# Patient Record
Sex: Female | Born: 1990 | Race: Black or African American | Hispanic: No | Marital: Married | Smoking: Never smoker
Health system: Southern US, Community
[De-identification: ages and names within clinical notes are randomized; demographics above are authoritative.]

## PROBLEM LIST (undated history)

## (undated) DIAGNOSIS — T7840XA Allergy, unspecified, initial encounter: Secondary | ICD-10-CM

## (undated) DIAGNOSIS — D649 Anemia, unspecified: Secondary | ICD-10-CM

## (undated) DIAGNOSIS — O24419 Gestational diabetes mellitus in pregnancy, unspecified control: Secondary | ICD-10-CM

## (undated) DIAGNOSIS — L309 Dermatitis, unspecified: Secondary | ICD-10-CM

## (undated) HISTORY — DX: Allergy, unspecified, initial encounter: T78.40XA

## (undated) HISTORY — PX: WISDOM TOOTH EXTRACTION: SHX21

## (undated) HISTORY — DX: Dermatitis, unspecified: L30.9

## (undated) HISTORY — DX: Anemia, unspecified: D64.9

---
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
## 2013-01-02 HISTORY — PX: TONSILLECTOMY AND ADENOIDECTOMY: SHX28

## 2013-01-17 ENCOUNTER — Encounter: Payer: Self-pay | Admitting: Family Medicine

## 2013-01-17 ENCOUNTER — Ambulatory Visit (INDEPENDENT_AMBULATORY_CARE_PROVIDER_SITE_OTHER): Payer: 59 | Admitting: Family Medicine

## 2013-01-17 VITALS — BP 122/80 | HR 101 | Temp 98.8°F | Ht 60.0 in | Wt 185.0 lb

## 2013-01-17 DIAGNOSIS — L819 Disorder of pigmentation, unspecified: Secondary | ICD-10-CM

## 2013-01-17 DIAGNOSIS — R319 Hematuria, unspecified: Secondary | ICD-10-CM

## 2013-01-17 LAB — CBC WITH DIFFERENTIAL/PLATELET
Eosinophils Absolute: 0 10*3/uL (ref 0.0–0.7)
Eosinophils Relative: 0.8 % (ref 0.0–5.0)
MCHC: 34 g/dL (ref 30.0–36.0)
MCV: 88.3 fl (ref 78.0–100.0)
Monocytes Absolute: 0.2 10*3/uL (ref 0.1–1.0)
Neutro Abs: 2.4 10*3/uL (ref 1.4–7.7)
Neutrophils Relative %: 61.5 % (ref 43.0–77.0)
Platelets: 260 10*3/uL (ref 150.0–400.0)

## 2013-01-17 LAB — BASIC METABOLIC PANEL
BUN: 5 mg/dL — ABNORMAL LOW (ref 6–23)
CO2: 24 mEq/L (ref 19–32)
Chloride: 109 mEq/L (ref 96–112)
Creatinine, Ser: 0.9 mg/dL (ref 0.4–1.2)

## 2013-01-17 LAB — HEPATIC FUNCTION PANEL
AST: 20 U/L (ref 0–37)
Albumin: 3.8 g/dL (ref 3.5–5.2)
Bilirubin, Direct: 0 mg/dL (ref 0.0–0.3)
Total Bilirubin: 0.4 mg/dL (ref 0.3–1.2)
Total Protein: 8.2 g/dL (ref 6.0–8.3)

## 2013-01-17 LAB — POCT URINALYSIS DIPSTICK
Glucose, UA: NEGATIVE
Ketones, UA: NEGATIVE
Leukocytes, UA: NEGATIVE
Nitrite, UA: NEGATIVE
Spec Grav, UA: 1.01
Urobilinogen, UA: 0.2
pH, UA: 6

## 2013-01-17 NOTE — Progress Notes (Signed)
  Subjective:    Patient ID: Cheryl Mullen, female    DOB: 07-Nov-1990, 22 y.o.   MRN: [id number]  HPI Pt is here with mom to establish.  She c/o palms turning orange after tonsilectomy but it is better today.     Review of Systems As above  Past Medical History  Diagnosis Date  . Anemia   . Allergy   . Eczema    No current outpatient prescriptions on file prior to visit.   No current facility-administered medications on file prior to visit.   Family History  Problem Relation Age of Onset  . Hypertension Mother   . Hyperlipidemia Mother   . Hypertension Father   . Hyperlipidemia Father   . Hypertension Maternal Grandmother   . Hypertension Maternal Grandfather   . Diabetes Maternal Grandfather   . Hypertension Paternal Grandmother   . Hypertension Paternal Grandfather    History   Social History  . Marital Status: Single    Spouse Name: N/A    Number of Children: N/A  . Years of Education: N/A   Occupational History  . [employer] group leader    Social History Main Topics  . Smoking status: Never Smoker   . Smokeless tobacco: Not on file  . Alcohol Use: Yes  . Drug Use: No  . Sexual Activity: Not Currently    Partners: Female    Birth Control/ Protection: Injection   Other Topics Concern  . Not on file   Social History Narrative   Exercise--- NO       Objective:   Physical Exam BP 122/80  Pulse 101  Temp(Src) 98.8 F (37.1 C) (Oral)  Ht 5' (1.524 m)  Wt 185 lb (83.915 kg)  BMI 36.13 kg/m2  SpO2 98% General appearance: alert, cooperative, appears stated age and no distress Throat: lips, mucosa, and tongue normal; teeth and gums normal Neck: no adenopathy, no carotid bruit, no JVD, supple, symmetrical, trachea midline and thyroid not enlarged, symmetric, no tenderness/mass/nodules Lungs: clear to auscultation bilaterally Heart: S1, S2 normal Skin: Skin color, texture, turgor normal. No rashes or lesions       Assessment & Plan:

## 2013-01-17 NOTE — Addendum Note (Signed)
Addended by: Silvio Pate D on: 01/17/2013 04:43 PM   Modules accepted: Orders

## 2013-01-17 NOTE — Assessment & Plan Note (Signed)
Check labs If it occurs again --rto so we can see it

## 2013-01-21 LAB — URINE CULTURE: Colony Count: >=100000

## 2013-01-22 ENCOUNTER — Other Ambulatory Visit: Payer: Self-pay | Admitting: Family Medicine

## 2013-01-22 DIAGNOSIS — N390 Urinary tract infection, site not specified: Secondary | ICD-10-CM

## 2013-01-22 MED ORDER — CIPROFLOXACIN HCL 250 MG PO TABS: 250.0000 mg | ORAL_TABLET | Freq: Two times a day (BID) | ORAL | Status: DC

## 2013-02-09 ENCOUNTER — Encounter: Payer: Self-pay | Admitting: Family Medicine

## 2013-02-09 ENCOUNTER — Other Ambulatory Visit: Payer: Self-pay

## 2013-02-09 ENCOUNTER — Other Ambulatory Visit: Payer: Self-pay | Admitting: Family Medicine

## 2013-02-09 DIAGNOSIS — N390 Urinary tract infection, site not specified: Secondary | ICD-10-CM

## 2013-02-10 ENCOUNTER — Ambulatory Visit: Payer: 59 | Admitting: Family Medicine

## 2013-02-13 ENCOUNTER — Ambulatory Visit: Payer: 59 | Admitting: Family Medicine

## 2013-02-13 DIAGNOSIS — Z0289 Encounter for other administrative examinations: Secondary | ICD-10-CM

## 2013-02-28 ENCOUNTER — Encounter: Payer: Self-pay | Admitting: Family Medicine

## 2013-02-28 ENCOUNTER — Ambulatory Visit (INDEPENDENT_AMBULATORY_CARE_PROVIDER_SITE_OTHER): Payer: 59 | Admitting: Family Medicine

## 2013-02-28 VITALS — BP 112/74 | HR 88 | Resp 16 | Wt 183.2 lb

## 2013-02-28 DIAGNOSIS — N898 Other specified noninflammatory disorders of vagina: Secondary | ICD-10-CM

## 2013-02-28 NOTE — Patient Instructions (Signed)
Sexually Transmitted Disease °Sexually transmitted disease (STD) refers to any infection that is passed from person to person during sexual activity. This may happen by way of saliva, semen, blood, vaginal mucus, or urine. Common STDs include: °· Gonorrhea. °· Chlamydia. °· Syphilis. °· HIV/AIDS. °· Genital herpes. °· Hepatitis B and C. °· Trichomonas. °· Human papillomavirus (HPV). °· Pubic lice. °CAUSES  °An STD may be spread by bacteria, virus, or parasite. A person can get an STD by: °· Sexual intercourse with an infected person. °· Sharing sex toys with an infected person. °· Sharing needles with an infected person. °· Having intimate contact with the genitals, mouth, or rectal areas of an infected person. °SYMPTOMS  °Some people may not have any symptoms, but they can still pass the infection to others. Different STDs have different symptoms. Symptoms include: °· Painful or bloody urination. °· Pain in the pelvis, abdomen, vagina, anus, throat, or eyes. °· Skin rash, itching, irritation, growths, or sores (lesions). These usually occur in the genital or anal area. °· Abnormal vaginal discharge. °· Penile discharge in men. °· Soft, flesh-colored skin growths in the genital or anal area. °· Fever. °· Pain or bleeding during sexual intercourse. °· Swollen glands in the groin area. °· Yellow skin and eyes (jaundice). This is seen with hepatitis. °DIAGNOSIS  °To make a diagnosis, your caregiver may: °· Take a medical history. °· Perform a physical exam. °· Take a specimen (culture) to be examined. °· Examine a sample of discharge under a microscope. °· Perform blood tests. °· Perform a Pap test, if this applies. °· Perform a colposcopy. °· Perform a laparoscopy. °TREATMENT  °· Chlamydia, gonorrhea, trichomonas, and syphilis can be cured with antibiotic medicine. °· Genital herpes, hepatitis, and HIV can be treated, but not cured, with prescribed medicines. The medicines will lessen the symptoms. °· Genital warts  from HPV can be treated with medicine or by freezing, burning (electrocautery), or surgery. Warts may come back. °· HPV is a virus and cannot be cured with medicine or surgery. However, abnormal areas may be followed very closely by your caregiver and may be removed from the cervix, vagina, or vulva through office procedures or surgery. °If your diagnosis is confirmed, your recent sexual partners need treatment. This is true even if they are symptom-free or have a negative culture or evaluation. They should not have sex until their caregiver says it is okay. °HOME CARE INSTRUCTIONS °· All sexual partners should be informed, tested, and treated for all STDs. °· Take your antibiotics as directed. Finish them even if you start to feel better. °· Only take over-the-counter or prescription medicines for pain, discomfort, or fever as directed by your caregiver. °· Rest. °· Eat a balanced diet and drink enough fluids to keep your urine clear or pale yellow. °· Do not have sex until treatment is completed and you have followed up with your caregiver. STDs should be checked after treatment. °· Keep all follow-up appointments, Pap tests, and blood tests as directed by your caregiver. °· Only use latex condoms and water-soluble lubricants during sexual activity. Do not use petroleum jelly or oils. °· Avoid alcohol and illegal drugs. °· Get vaccinated for HPV and hepatitis. If you have not received these vaccines in the past, talk to your caregiver about whether one or both might be right for you. °· Avoid risky sex practices that can break the skin. °The only way to avoid getting an STD is to avoid all sexual activity. Latex condoms and dental   dams (for oral sex) will help lessen the risk of getting an STD, but will not completely eliminate the risk. °SEEK MEDICAL CARE IF:  °· You have a fever. °· You have any new or worsening symptoms. °Document Released: 06/13/2002 Document Revised: 06/15/2011 Document Reviewed:  10/11/2012 °ExitCare® Patient Information ©2014 [employer]. ° °

## 2013-02-28 NOTE — Progress Notes (Signed)
Pre visit review using our clinic review tool, if applicable. No additional management support is needed unless otherwise documented below in the visit note. 

## 2013-02-28 NOTE — Progress Notes (Signed)
  Subjective:    Cheryl Mullen is a 22 y.o. female who presents for sexually transmitted disease check. Sexual history reviewed with the patient. STI Exposure: sexual contact with individual with uncertain background 1 week ago.  Previous history of STI chlamydia. Current symptoms vaginal discharge: white and thin. Contraception: Depo-Provera injections Menstrual History: OB History   Grav Para Term Preterm Abortions TAB SAB Ect Mult Living                  No LMP recorded. Patient has had an injection.    The following portions of the patient's history were reviewed and updated as appropriate: allergies, current medications, past family history, past medical history, past social history, past surgical history and problem list.  Review of Systems Pertinent items are noted in HPI.    Objective:    BP 112/74  Pulse 88  Resp 16  Wt 183 lb 3.2 oz (83.099 kg)  SpO2 99% General:   alert, cooperative, appears stated age and no distress  Lymph Nodes:   Cervical, supraclavicular, and axillary nodes normal.  Pelvis:  Vulva and vagina appear normal. Bimanual exam reveals normal uterus and adnexa. External genitalia: normal general appearance Vaginal: discharge, white Clinical staff offered to be present for exam: yes  Initials: kp  Cultures:  GC and Chlamydia genprobes and wet prep     Assessment:    Possible STD exposure    Plan:    Appropriate educational material was distributed See orders for STD cultures and assays Will call pt with results RTC PRN

## 2013-03-01 ENCOUNTER — Encounter: Payer: Self-pay | Admitting: Family Medicine

## 2013-03-01 NOTE — Addendum Note (Signed)
Addended by: Silvio Pate D on: 03/01/2013 03:15 PM   Modules accepted: Orders

## 2013-03-02 LAB — WET PREP BY MOLECULAR PROBE
Candida species: NEGATIVE
Gardnerella vaginalis: NEGATIVE
Trichomonas vaginosis: NEGATIVE

## 2013-03-02 LAB — GC/CHLAMYDIA PROBE AMP: CT Probe RNA: NEGATIVE

## 2013-04-19 ENCOUNTER — Telehealth: Payer: Self-pay | Admitting: *Deleted

## 2013-04-19 MED ORDER — CETIRIZINE HCL 10 MG PO CAPS: 10.0000 mg | ORAL_CAPSULE | Freq: Every day | ORAL | Status: DC

## 2013-04-19 NOTE — Telephone Encounter (Signed)
Rx faxed and the patient has been made aware.     KP 

## 2013-04-19 NOTE — Telephone Encounter (Signed)
Patient mother called and stated that she need a rx for Zyrtec because they usually just buy it over the counter but now her flex spending card want them to have a written rx for it.

## 2013-05-17 ENCOUNTER — Ambulatory Visit (INDEPENDENT_AMBULATORY_CARE_PROVIDER_SITE_OTHER): Payer: 59 | Admitting: Family Medicine

## 2013-05-17 ENCOUNTER — Encounter: Payer: Self-pay | Admitting: Family Medicine

## 2013-05-17 VITALS — BP 116/80 | HR 84 | Temp 98.3°F | Wt 187.0 lb

## 2013-05-17 DIAGNOSIS — L259 Unspecified contact dermatitis, unspecified cause: Secondary | ICD-10-CM

## 2013-05-17 DIAGNOSIS — Z3049 Encounter for surveillance of other contraceptives: Secondary | ICD-10-CM

## 2013-05-17 DIAGNOSIS — Z3042 Encounter for surveillance of injectable contraceptive: Secondary | ICD-10-CM | POA: Insufficient documentation

## 2013-05-17 DIAGNOSIS — E669 Obesity, unspecified: Secondary | ICD-10-CM | POA: Insufficient documentation

## 2013-05-17 DIAGNOSIS — L309 Dermatitis, unspecified: Secondary | ICD-10-CM | POA: Insufficient documentation

## 2013-05-17 MED ORDER — VITAMIN D (CHOLECALCIFEROL) 25 MCG (1000 UT) PO TABS: ORAL_TABLET | ORAL | Status: DC

## 2013-05-17 MED ORDER — MEDROXYPROGESTERONE ACETATE 150 MG/ML IM SUSP
150.0000 mg | Freq: Once | INTRAMUSCULAR | Status: AC
  Administered 2013-05-17: 150 mg via INTRAMUSCULAR

## 2013-05-17 MED ORDER — CALCIUM 600 MG PO TABS: 600.0000 mg | ORAL_TABLET | Freq: Two times a day (BID) | ORAL | Status: DC

## 2013-05-17 MED ORDER — BETAMETHASONE DIPROPIONATE AUG 0.05 % EX OINT: TOPICAL_OINTMENT | Freq: Two times a day (BID) | CUTANEOUS | Status: DC

## 2013-05-17 NOTE — Addendum Note (Signed)
Addended by: Arnette NorrisPAYNE, Leeza Heiner P on: 05/17/2013 12:02 PM   Modules accepted: Orders

## 2013-05-17 NOTE — Patient Instructions (Signed)
Eczema Eczema, also called atopic dermatitis, is a skin disorder that causes inflammation of the skin. It causes a red rash and dry, scaly skin. The skin becomes very itchy. Eczema is generally worse during the cooler winter months and often improves with the warmth of summer. Eczema usually starts showing signs in infancy. Some children outgrow eczema, but it may last through adulthood.  CAUSES  The exact cause of eczema is not known, but it appears to run in families. People with eczema often have a family history of eczema, allergies, asthma, or hay fever. Eczema is not contagious. Flare-ups of the condition may be caused by:   Contact with something you are sensitive or allergic to.   Stress. SIGNS AND SYMPTOMS  Dry, scaly skin.   Red, itchy rash.   Itchiness. This may occur before the skin rash and may be very intense.  DIAGNOSIS  The diagnosis of eczema is usually made based on symptoms and medical history. TREATMENT  Eczema cannot be cured, but symptoms usually can be controlled with treatment and other strategies. A treatment plan might include:  Controlling the itching and scratching.   Use over-the-counter antihistamines as directed for itching. This is especially useful at night when the itching tends to be worse.   Use over-the-counter steroid creams as directed for itching.   Avoid scratching. Scratching makes the rash and itching worse. It may also result in a skin infection (impetigo) due to a break in the skin caused by scratching.   Keeping the skin well moisturized with creams every day. This will seal in moisture and help prevent dryness. Lotions that contain alcohol and water should be avoided because they can dry the skin.   Limiting exposure to things that you are sensitive or allergic to (allergens).   Recognizing situations that cause stress.   Developing a plan to manage stress.  HOME CARE INSTRUCTIONS   Only take over-the-counter or  prescription medicines as directed by your health care provider.   Do not use anything on the skin without checking with your health care provider.   Keep baths or showers short (5 minutes) in warm (not hot) water. Use mild cleansers for bathing. These should be unscented. You may add nonperfumed bath oil to the bath water. It is best to avoid soap and bubble bath.   Immediately after a bath or shower, when the skin is still damp, apply a moisturizing ointment to the entire body. This ointment should be a petroleum ointment. This will seal in moisture and help prevent dryness. The thicker the ointment, the better. These should be unscented.   Keep fingernails cut short. Children with eczema may need to wear soft gloves or mittens at night after applying an ointment.   Dress in clothes made of cotton or cotton blends. Dress lightly, because heat increases itching.   A child with eczema should stay away from anyone with fever blisters or cold sores. The virus that causes fever blisters (herpes simplex) can cause a serious skin infection in children with eczema. SEEK MEDICAL CARE IF:   Your itching interferes with sleep.   Your rash gets worse or is not better within 1 week after starting treatment.   You see pus or soft yellow scabs in the rash area.   You have a fever.   You have a rash flare-up after contact with someone who has fever blisters.  Document Released: 03/20/2000 Document Revised: 01/11/2013 Document Reviewed: 10/24/2012 [facility] Patient Information 2014 [place], [place].  Medroxyprogesterone  injection [Contraceptive] What is this medicine? MEDROXYPROGESTERONE (me DROX ee proe JES te rone) contraceptive injections prevent pregnancy. They provide effective birth control for 3 months. Depo-subQ Provera 104 is also used for treating pain related to endometriosis. This medicine may be used for other purposes; ask your health care provider or pharmacist if you have  questions. COMMON BRAND NAME(S): Depo-Provera, Depo-subQ Provera 104 What should I tell my health care provider before I take this medicine? They need to know if you have any of these conditions: -frequently drink alcohol -asthma -blood vessel disease or a history of a blood clot in the lungs or legs -bone disease such as osteoporosis -breast cancer -diabetes -eating disorder (anorexia nervosa or bulimia) -high blood pressure -HIV infection or AIDS -kidney disease -liver disease -mental depression -migraine -seizures (convulsions) -stroke -tobacco smoker -vaginal bleeding -an unusual or allergic reaction to medroxyprogesterone, other hormones, medicines, foods, dyes, or preservatives -pregnant or trying to get pregnant -breast-feeding How should I use this medicine? Depo-Provera Contraceptive injection is given into a muscle. Depo-subQ Provera 104 injection is given under the skin. These injections are given by a health care professional. You must not be pregnant before getting an injection. The injection is usually given during the first 5 days after the start of a menstrual period or 6 weeks after delivery of a baby. Talk to your pediatrician regarding the use of this medicine in children. Special care may be needed. These injections have been used in female children who have started having menstrual periods. Overdosage: If you think you have taken too much of this medicine contact a poison control center or emergency room at once. NOTE: This medicine is only for you. Do not share this medicine with others. What if I miss a dose? Try not to miss a dose. You must get an injection once every 3 months to maintain birth control. If you cannot keep an appointment, call and reschedule it. If you wait longer than 13 weeks between Depo-Provera contraceptive injections or longer than 14 weeks between Depo-subQ Provera 104 injections, you could get pregnant. Use another method for birth control  if you miss your appointment. You may also need a pregnancy test before receiving another injection. What may interact with this medicine? Do not take this medicine with any of the following medications: -bosentan This medicine may also interact with the following medications: -aminoglutethimide -antibiotics or medicines for infections, especially rifampin, rifabutin, rifapentine, and griseofulvin -aprepitant -barbiturate medicines such as phenobarbital or primidone -bexarotene -carbamazepine -medicines for seizures like ethotoin, felbamate, oxcarbazepine, phenytoin, topiramate -modafinil -St. John's wort This list may not describe all possible interactions. Give your health care provider a list of all the medicines, herbs, non-prescription drugs, or dietary supplements you use. Also tell them if you smoke, drink alcohol, or use illegal drugs. Some items may interact with your medicine. What should I watch for while using this medicine? This drug does not protect you against HIV infection (AIDS) or other sexually transmitted diseases. Use of this product may cause you to lose calcium from your bones. Loss of calcium may cause weak bones (osteoporosis). Only use this product for more than 2 years if other forms of birth control are not right for you. The longer you use this product for birth control the more likely you will be at risk for weak bones. Ask your health care professional how you can keep strong bones. You may have a change in bleeding pattern or irregular periods. Many females stop having periods  while taking this drug. If you have received your injections on time, your chance of being pregnant is very low. If you think you may be pregnant, see your health care professional as soon as possible. Tell your health care professional if you want to get pregnant within the next year. The effect of this medicine may last a long time after you get your last injection. What side effects may I  notice from receiving this medicine? Side effects that you should report to your doctor or health care professional as soon as possible: -allergic reactions like skin rash, itching or hives, swelling of the face, lips, or tongue -breast tenderness or discharge -breathing problems -changes in vision -depression -feeling faint or lightheaded, falls -fever -pain in the abdomen, chest, groin, or leg -problems with balance, talking, walking -unusually weak or tired -yellowing of the eyes or skin Side effects that usually do not require medical attention (report to your doctor or health care professional if they continue or are bothersome): -acne -fluid retention and swelling -headache -irregular periods, spotting, or absent periods -temporary pain, itching, or skin reaction at site where injected -weight gain This list may not describe all possible side effects. Call your doctor for medical advice about side effects. You may report side effects to FDA at 1-800-FDA-1088. Where should I keep my medicine? This does not apply. The injection will be given to you by a health care professional. NOTE: This sheet is a summary. It may not cover all possible information. If you have questions about this medicine, talk to your doctor, pharmacist, or health care provider.  2014, [employer]vier/[employer] Standard. (2008-04-13 18:37:56)

## 2013-05-17 NOTE — Progress Notes (Signed)
Patient ID: Cheryl Mullen, female   DOB: 04/28/1990, 23 y.o.   MRN: [id number]   Subjective:    Patient ID: Cheryl Mullen, female    DOB: 04/28/1990, 23 y.o.   MRN: [id number] HPI Pt here to get a refill on her diprolene ointment for her eczema and she would like to start getting her Depo provera here.  Pt last pap was 6 months ago per pt.         Objective:    BP 116/80  Pulse 84  Temp(Src) 98.3 F (36.8 C) (Oral)  Wt 187 lb (84.823 kg)  SpO2 97% General appearance: alert, cooperative, appears stated age and no distress Neurologic: Grossly normal skiin-- + dry scaly patches of skin on arms and legs      Assessment & Plan:  1. Eczema Refill med - augmented betamethasone dipropionate (DIPROLENE) 0.05 % ointment; Apply topically 2 (two) times daily.  Dispense: 50 g; Refill: 3  2. Depo contraception Urine preg neg  - POCT urine pregnancy - Calcium 600 MG tablet; Take 1 tablet (600 mg total) by mouth 2 (two) times daily.  Dispense: 60 tablet - Vitamin D, Cholecalciferol, 1000 UNITS TABS; 1 po qd  Dispense: 30 tablet  3. Surveillance for Depo-Provera contraception rto 3 months

## 2013-05-17 NOTE — Progress Notes (Signed)
Pre visit review using our clinic review tool, if applicable. No additional management support is needed unless otherwise documented below in the visit note. 

## 2013-05-30 ENCOUNTER — Encounter: Payer: Self-pay | Admitting: Family Medicine

## 2013-09-18 ENCOUNTER — Emergency Department (HOSPITAL_BASED_OUTPATIENT_CLINIC_OR_DEPARTMENT_OTHER)
Admission: EM | Admit: 2013-09-18 | Discharge: 2013-09-18 | Disposition: A | Discharge status: Home / Self Care | Payer: 59 | Attending: Emergency Medicine | Admitting: Emergency Medicine

## 2013-09-18 ENCOUNTER — Encounter (HOSPITAL_BASED_OUTPATIENT_CLINIC_OR_DEPARTMENT_OTHER): Payer: Self-pay | Admitting: Emergency Medicine

## 2013-09-18 DIAGNOSIS — H579 Unspecified disorder of eye and adnexa: Secondary | ICD-10-CM | POA: Insufficient documentation

## 2013-09-18 DIAGNOSIS — Z79899 Other long term (current) drug therapy: Secondary | ICD-10-CM | POA: Insufficient documentation

## 2013-09-18 DIAGNOSIS — Z872 Personal history of diseases of the skin and subcutaneous tissue: Secondary | ICD-10-CM | POA: Insufficient documentation

## 2013-09-18 DIAGNOSIS — H5789 Other specified disorders of eye and adnexa: Secondary | ICD-10-CM

## 2013-09-18 DIAGNOSIS — H53149 Visual discomfort, unspecified: Secondary | ICD-10-CM | POA: Insufficient documentation

## 2013-09-18 DIAGNOSIS — Z862 Personal history of diseases of the blood and blood-forming organs and certain disorders involving the immune mechanism: Secondary | ICD-10-CM | POA: Insufficient documentation

## 2013-09-18 DIAGNOSIS — IMO0002 Reserved for concepts with insufficient information to code with codable children: Secondary | ICD-10-CM | POA: Insufficient documentation

## 2013-09-18 MED ORDER — TETRACAINE HCL 0.5 % OP SOLN
OPHTHALMIC | Status: AC
  Filled 2013-09-18: qty 2

## 2013-09-18 MED ORDER — TETRACAINE HCL 0.5 % OP SOLN: 1.0000 [drp] | Freq: Once | OPHTHALMIC | Status: DC

## 2013-09-18 MED ORDER — FLUORESCEIN SODIUM 1 MG OP STRP
ORAL_STRIP | OPHTHALMIC | Status: AC
  Administered 2013-09-18: 23:00:00 1 via OPHTHALMIC
  Filled 2013-09-18: qty 1

## 2013-09-18 MED ORDER — FLUORESCEIN SODIUM 1 MG OP STRP
1.0000 | ORAL_STRIP | Freq: Once | OPHTHALMIC | Status: AC
  Administered 2013-09-18: 1 via OPHTHALMIC

## 2013-09-18 MED ORDER — CARBOXYMETHYLCELLULOSE SODIUM 0.5 % OP SOLN: 1.0000 [drp] | Freq: Three times a day (TID) | OPHTHALMIC | Status: DC | PRN

## 2013-09-18 NOTE — Discharge Instructions (Signed)
Chemical Conjunctivitis  Chemical conjunctivitis is an irritation of the underside of the eyelid and the white part of the eye. Conjunctivitis can be caused by infection, allergy or chemical irritation. In your case it has been caused by a chemical irritation of the eye. Symptoms almost always include: tearing, light sensitivity, gritty feeling (sensation) in the eyes, swelling of your eyelids, and often severe pain. In spite of the severe pain, this irritation will run its course and will improve within 24 hours.   HOME CARE INSTRUCTIONS   · To ease discomfort apply a cool, clean wash cloth to your eye for 10 to 20 minutes, 3 to 4 times per day.  · Do not rub your eyes.  · Gently wipe away any discharge from the eyes with moistened tissues.  · Wash your hands often with soap and use paper towels to dry.  · Sunglasses may be helpful if light bothers your eyes.  · Do not use eye make-up.  · Do not use contact lenses until the irritation is gone.  · Do not operate machinery or drive if your vision is blurred.  · Take medications as directed by your caregiver. Artificial tears may ease discomfort.  · Avoid the chemical or surroundings which caused the problem. Always use eye protection as necessary.  SEEK MEDICAL CARE IF:   · The eye is still pink (inflamed) 3 days after beginning treatment.  · Pain in the eye increases.  · You have discharge coming from either eye.  · Your eyelids are stuck together in the morning.  · You have an increased sensitivity to light.  · An oral temperature above 102° F (38.9° C) develops.  · You develop facial pain.  · You have any problems that may be related to the medicine you are taking.  SEEK IMMEDIATE MEDICAL CARE IF:   · Your vision is getting worse.  · You develop severe eye pain.  MAKE SURE YOU:   · Understand these instructions.  · Will watch your condition.  · Will get help right away if you are not doing well or get worse.  Document Released: 12/31/2004 Document Revised:  06/15/2011 Document Reviewed: 11/09/2007  ExitCare® Patient Information ©2014 [employer].

## 2013-09-18 NOTE — ED Notes (Signed)
Pt c/o redness and irritation to bil eyes x 2 d ays

## 2013-09-18 NOTE — ED Notes (Signed)
Bilateral eyes irrigated with normal saline. 

## 2013-09-18 NOTE — ED Provider Notes (Signed)
CSN: [id number]     Arrival date & time 09/18/13  2133 History  This chart was scribed for Cheryl Batonourtney F Lanson Randle, MD by Charline BillsEssence Mullen, ED Scribe. The patient was seen in room MH06/MH06. Patient's care was started at 10:24 PM.   Chief Complaint  Patient presents with  . Eye Problem   The history is provided by the patient. No language interpreter was used.   HPI Comments: Cheryl Mullen is a 23 y.o. female who presents to the Emergency Department complaining of constant bilateral eye pain, L worse than R, since yesterday. She describes the quality of pain as a burning sensation. Pt reports associated bilateral eye redness and tearing. She denies rhinorrhea, sore throat, recent illness. Pt also denies foreign bodies or chemicals in her eyes. Pt does not wear corrective lenses. Patient reports that she had itching in her eyes earlier today and tried Visine which worsened patient's symptoms. She denies any vision changes.  Past Medical History  Diagnosis Date  . Anemia   . Allergy   . Eczema    Past Surgical History  Procedure Laterality Date  . Tonsillectomy and adenoidectomy Bilateral 01/02/2013   Family History  Problem Relation Age of Onset  . Hypertension Mother   . Hyperlipidemia Mother   . Hypertension Father   . Hyperlipidemia Father   . Hypertension Maternal Grandmother   . Hypertension Maternal Grandfather   . Diabetes Maternal Grandfather   . Hypertension Paternal Grandmother   . Hypertension Paternal Grandfather    History  Substance Use Topics  . Smoking status: Never Smoker   . Smokeless tobacco: Not on file  . Alcohol Use: Yes   OB History   Grav Para Term Preterm Abortions TAB SAB Ect Mult Living                 Review of Systems  Eyes: Positive for photophobia, pain, discharge, redness and itching. Negative for visual disturbance.  Skin: Negative for wound.  All other systems reviewed and are negative.   Allergies  Review of patient's allergies indicates  no known allergies.  Home Medications   Prior to Admission medications   Medication Sig Start Date End Date Taking? Authorizing Provider  augmented betamethasone dipropionate (DIPROLENE) 0.05 % ointment Apply topically 2 (two) times daily. 05/17/13   Lelon PerlaYvonne R Lowne, DO  Calcium 600 MG tablet Take 1 tablet (600 mg total) by mouth 2 (two) times daily. 05/17/13   Lelon PerlaYvonne R Lowne, DO  carboxymethylcellulose (REFRESH TEARS) 0.5 % SOLN Place 1 drop into both eyes 3 (three) times daily as needed. 09/18/13   Cheryl Batonourtney F Anaysha Andre, MD  Cetirizine HCl (ZYRTEC ALLERGY) 10 MG CAPS Take 1 capsule (10 mg total) by mouth daily. 04/19/13   Lelon PerlaYvonne R Lowne, DO  Vitamin D, Cholecalciferol, 1000 UNITS TABS 1 po qd 05/17/13   Lelon PerlaYvonne R Lowne, DO   Triage Vitals: BP 145/84  Pulse 86  Temp(Src) 99.6 F (37.6 C) (Oral)  Resp 16  Ht 5' (1.524 m)  Wt 180 lb (81.647 kg)  BMI 35.15 kg/m2  SpO2 98% Physical Exam  Nursing note and vitals reviewed. Constitutional: She is oriented to person, place, and time. She appears well-developed and well-nourished.  Uncomfortable appearing  HENT:  Head: Normocephalic and atraumatic.  Eyes: EOM are normal. Pupils are equal, round, and reactive to light.  Mild conjunctival injection bilaterally, excessive tearing, no fluouroscein update on woods lamp examination  Cardiovascular: Normal rate and regular rhythm.   Pulmonary/Chest: Effort normal. No respiratory  distress.  Neurological: She is alert and oriented to person, place, and time.  Skin: Skin is warm and dry.  Psychiatric: She has a normal mood and affect.    ED Course  Procedures (including critical care time) DIAGNOSTIC STUDIES: Oxygen Saturation is 98% on RA, normal by my interpretation.    COORDINATION OF CARE: 10:35 PM Discussed treatment plan with pt at bedside and pt agreed to plan.  Labs Review Labs Reviewed - No data to display  Imaging Review No results found.   EKG Interpretation None      MDM    Final diagnoses:  Irritation of both eyes    Patient presents with bilateral eye burning. Initially started out with itching.  Worsened with Visine. Patient reports a Visine bottle was new.  Fluoroscein exam is unremarkable. Patient's eyes were copiously flushed with normal saline. Patient reports marked improvement of the burning. Suspect chemical irritation secondary to Visine drops. Advised the patient to discard that bottle.  After history, exam, and medical workup I feel the patient has been appropriately medically screened and is safe for discharge home. Pertinent diagnoses were discussed with the patient. Patient was given return precautions.   I personally performed the services described in this documentation, which was scribed in my presence. The recorded information has been reviewed and is accurate.    Cheryl Batonourtney F [name], MD 09/18/13 [phone]

## 2013-10-08 ENCOUNTER — Encounter: Payer: Self-pay | Admitting: Family Medicine

## 2013-12-18 ENCOUNTER — Encounter: Payer: Self-pay | Admitting: Family Medicine

## 2013-12-21 ENCOUNTER — Encounter: Payer: Self-pay | Admitting: Family Medicine

## 2013-12-21 ENCOUNTER — Ambulatory Visit (INDEPENDENT_AMBULATORY_CARE_PROVIDER_SITE_OTHER): Payer: 59 | Admitting: Family Medicine

## 2013-12-21 ENCOUNTER — Other Ambulatory Visit (HOSPITAL_COMMUNITY)
Admission: RE | Admit: 2013-12-21 | Discharge: 2013-12-21 | Disposition: A | Discharge status: Home / Self Care | Payer: 59 | Source: Ambulatory Visit | Attending: Family Medicine | Admitting: Family Medicine

## 2013-12-21 VITALS — BP 125/79 | HR 83 | Temp 98.2°F | Wt 206.8 lb

## 2013-12-21 DIAGNOSIS — Z113 Encounter for screening for infections with a predominantly sexual mode of transmission: Secondary | ICD-10-CM | POA: Diagnosis not present

## 2013-12-21 DIAGNOSIS — J018 Other acute sinusitis: Secondary | ICD-10-CM

## 2013-12-21 DIAGNOSIS — N912 Amenorrhea, unspecified: Secondary | ICD-10-CM

## 2013-12-21 DIAGNOSIS — N760 Acute vaginitis: Secondary | ICD-10-CM | POA: Insufficient documentation

## 2013-12-21 LAB — RPR

## 2013-12-21 LAB — POCT URINE PREGNANCY: Preg Test, Ur: NEGATIVE

## 2013-12-21 LAB — HCG, QUANTITATIVE, PREGNANCY: Quantitative HCG: 0.33 m[IU]/mL

## 2013-12-21 MED ORDER — CEFUROXIME AXETIL 500 MG PO TABS: 500.0000 mg | ORAL_TABLET | Freq: Two times a day (BID) | ORAL | Status: AC

## 2013-12-21 NOTE — Addendum Note (Signed)
Addended by: Arnette Norris on: 12/21/2013 12:17 PM   Modules accepted: Orders

## 2013-12-21 NOTE — Progress Notes (Signed)
  Subjective:     Cheryl Mullen is a 23 y.o. female who presents for evaluation of sinus pain. Symptoms include: congestion, cough, facial pain, headaches, sinus pressure and ear pain . no fever. Onset of symptoms was 5 days ago. Symptoms have been gradually worsening since that time. Past history is significant for no history of pneumonia or bronchitis. Patient is a non-smoker. Pt also requesting std testing and preg test.  Off depo since February.  No periods .    The following portions of the patient's history were reviewed and updated as appropriate:  She  has a past medical history of Anemia; Allergy; and Eczema. She  does not have any pertinent problems on file. She  has past surgical history that includes Tonsillectomy and adenoidectomy (Bilateral, 01/02/2013). Her family history includes Diabetes in her maternal grandfather; Hyperlipidemia in her father and mother; Hypertension in her father, maternal grandfather, maternal grandmother, mother, paternal grandfather, and paternal grandmother. She  reports that she has never smoked. She does not have any smokeless tobacco history on file. She reports that she drinks alcohol. She reports that she does not use illicit drugs. She currently has no medications in their medication list. No current outpatient prescriptions on file prior to visit.   No current facility-administered medications on file prior to visit.   She has No Known Allergies..  Review of Systems Pertinent items are noted in HPI.   Objective:    BP 125/79  Pulse 83  Temp(Src) 98.2 F (36.8 C) (Oral)  Wt 206 lb 12.7 oz (93.8 kg)  SpO2 100% General appearance: alert, cooperative, appears stated age and no distress Ears: normal TM's and external ear canals both ears Nose: green discharge, moderate congestion, turbinates red, swollen, sinus tenderness bilateral Throat: lips, mucosa, and tongue normal; teeth and gums normal Neck: no adenopathy, no carotid bruit, no JVD,  supple, symmetrical, trachea midline and thyroid not enlarged, symmetric, no tenderness/mass/nodules Lungs: clear to auscultation bilaterally Heart: S1, S2 normal Abdomen: soft, non-tender; bowel sounds normal; no masses,  no organomegaly Pelvic: cervix normal in appearance, external genitalia normal and positive findings: vaginal discharge:  white, thick and odorless    Assessment:    Acute bacterial sinusitis.    Plan:    Ceftin per medication orders.  antihistamin and nasal steroids  1. Amenorrhea Check preg stat-- if neg -- give depo provera  - POCT urine pregnancy - B-HCG Quant  2. Vaginitis and vulvovaginitis Check labs - HIV antibody - RPR - HSV 2 antibody, IgG  3. Other acute sinusitis See above - cefUROXime (CEFTIN) 500 MG tablet; Take 1 tablet (500 mg total) by mouth 2 (two) times daily.  Dispense: 20 tablet; Refill: 0

## 2013-12-21 NOTE — Patient Instructions (Signed)

## 2013-12-21 NOTE — Progress Notes (Signed)
Pre visit review using our clinic review tool, if applicable. No additional management support is needed unless otherwise documented below in the visit note. 

## 2013-12-22 ENCOUNTER — Encounter: Payer: Self-pay | Admitting: Family Medicine

## 2013-12-22 LAB — HIV ANTIBODY (ROUTINE TESTING W REFLEX): HIV 1&2 Ab, 4th Generation: NONREACTIVE

## 2013-12-22 LAB — CERVICOVAGINAL ANCILLARY ONLY
Chlamydia: POSITIVE — AB
NEISSERIA GONORRHEA: NEGATIVE
WET PREP (BD AFFIRM): NEGATIVE
WET PREP (BD AFFIRM): POSITIVE — AB
Wet Prep (BD Affirm): NEGATIVE

## 2013-12-22 LAB — HSV 2 ANTIBODY, IGG: HSV 2 Glycoprotein G Ab, IgG: 7.91 IV — ABNORMAL HIGH

## 2013-12-24 ENCOUNTER — Other Ambulatory Visit: Payer: Self-pay | Admitting: Family Medicine

## 2013-12-24 DIAGNOSIS — B9689 Other specified bacterial agents as the cause of diseases classified elsewhere: Secondary | ICD-10-CM

## 2013-12-24 DIAGNOSIS — N760 Acute vaginitis: Principal | ICD-10-CM

## 2013-12-24 MED ORDER — METRONIDAZOLE 500 MG PO TABS: 500.0000 mg | ORAL_TABLET | Freq: Two times a day (BID) | ORAL | Status: DC

## 2013-12-25 ENCOUNTER — Ambulatory Visit (INDEPENDENT_AMBULATORY_CARE_PROVIDER_SITE_OTHER): Payer: 59 | Admitting: *Deleted

## 2013-12-25 ENCOUNTER — Telehealth: Payer: Self-pay

## 2013-12-25 DIAGNOSIS — A749 Chlamydial infection, unspecified: Secondary | ICD-10-CM

## 2013-12-25 MED ORDER — AZITHROMYCIN 250 MG PO TABS: 1000.0000 mg | ORAL_TABLET | Freq: Once | ORAL | Status: DC

## 2013-12-25 MED ORDER — VALACYCLOVIR HCL 1 G PO TABS: 1000.0000 mg | ORAL_TABLET | Freq: Every day | ORAL | Status: DC

## 2013-12-25 MED ORDER — CEFTRIAXONE SODIUM 1 G IJ SOLR
500.0000 mg | Freq: Once | INTRAMUSCULAR | Status: AC
  Administered 2013-12-25: 500 mg via INTRAMUSCULAR

## 2013-12-25 MED ORDER — AZITHROMYCIN 500 MG PO TABS: 2000.0000 mg | ORAL_TABLET | Freq: Once | ORAL | Status: DC

## 2013-12-25 NOTE — Telephone Encounter (Signed)
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

## 2013-12-25 NOTE — Telephone Encounter (Signed)
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 She is scheduled at 10:15 for a nurse visit. Report sent to [facility]    KP

## 2014-07-26 ENCOUNTER — Encounter (HOSPITAL_BASED_OUTPATIENT_CLINIC_OR_DEPARTMENT_OTHER): Payer: Self-pay | Admitting: Emergency Medicine

## 2014-07-26 ENCOUNTER — Emergency Department (HOSPITAL_BASED_OUTPATIENT_CLINIC_OR_DEPARTMENT_OTHER): Payer: Self-pay

## 2014-07-26 DIAGNOSIS — Y9389 Activity, other specified: Secondary | ICD-10-CM | POA: Insufficient documentation

## 2014-07-26 DIAGNOSIS — Z862 Personal history of diseases of the blood and blood-forming organs and certain disorders involving the immune mechanism: Secondary | ICD-10-CM | POA: Insufficient documentation

## 2014-07-26 DIAGNOSIS — Y998 Other external cause status: Secondary | ICD-10-CM | POA: Insufficient documentation

## 2014-07-26 DIAGNOSIS — Y92009 Unspecified place in unspecified non-institutional (private) residence as the place of occurrence of the external cause: Secondary | ICD-10-CM | POA: Insufficient documentation

## 2014-07-26 DIAGNOSIS — Z872 Personal history of diseases of the skin and subcutaneous tissue: Secondary | ICD-10-CM | POA: Insufficient documentation

## 2014-07-26 DIAGNOSIS — S8391XA Sprain of unspecified site of right knee, initial encounter: Secondary | ICD-10-CM | POA: Insufficient documentation

## 2014-07-26 DIAGNOSIS — W1840XA Slipping, tripping and stumbling without falling, unspecified, initial encounter: Secondary | ICD-10-CM | POA: Insufficient documentation

## 2014-07-26 NOTE — ED Notes (Addendum)
Pt reports carring groceries into house, triped over clothing and injured right knee

## 2014-07-27 ENCOUNTER — Emergency Department (HOSPITAL_BASED_OUTPATIENT_CLINIC_OR_DEPARTMENT_OTHER)
Admission: EM | Admit: 2014-07-27 | Discharge: 2014-07-27 | Disposition: A | Discharge status: Home / Self Care | Payer: Self-pay | Attending: Emergency Medicine | Admitting: Emergency Medicine

## 2014-07-27 DIAGNOSIS — S8391XA Sprain of unspecified site of right knee, initial encounter: Secondary | ICD-10-CM

## 2014-07-27 MED ORDER — HYDROCODONE-ACETAMINOPHEN 5-325 MG PO TABS: 1.0000 | ORAL_TABLET | Freq: Four times a day (QID) | ORAL | Status: DC | PRN

## 2014-07-27 MED ORDER — HYDROCODONE-ACETAMINOPHEN 5-325 MG PO TABS
1.0000 | ORAL_TABLET | Freq: Once | ORAL | Status: AC
  Administered 2014-07-27: 1 via ORAL
  Filled 2014-07-27: qty 1

## 2014-07-27 NOTE — ED Provider Notes (Signed)
CSN: [id number]     Arrival date & time 07/26/14  2328 History   First MD Initiated Contact with Patient 07/27/14 [id number]     Chief Complaint  Patient presents with  . Knee Injury     (Consider location/radiation/quality/duration/timing/severity/associated sxs/prior Treatment) HPI  This is a 24 year old female who was carrying groceries into her house yesterday evening. She tripped over clothing and injured her right knee. She is having moderate to severe pain in her right knee. She describes pain as being deep inside the knee joint. She is having difficulty bearing weight on the right knee as bearing weight or ambulating worsens the pain. She denies other injury. There is no associated deformity. There is no numbness or weakness distal to the injury.  Past Medical History  Diagnosis Date  . Anemia   . Allergy   . Eczema    Past Surgical History  Procedure Laterality Date  . Tonsillectomy and adenoidectomy Bilateral 01/02/2013   Family History  Problem Relation Age of Onset  . Hypertension Mother   . Hyperlipidemia Mother   . Hypertension Father   . Hyperlipidemia Father   . Hypertension Maternal Grandmother   . Hypertension Maternal Grandfather   . Diabetes Maternal Grandfather   . Hypertension Paternal Grandmother   . Hypertension Paternal Grandfather    History  Substance Use Topics  . Smoking status: Never Smoker   . Smokeless tobacco: Not on file  . Alcohol Use: Yes   OB History    No data available     Review of Systems  All other systems reviewed and are negative.   Allergies  Review of patient's allergies indicates no known allergies.  Home Medications   Prior to Admission medications   Not on File   BP 137/82 mmHg  Pulse 103  Temp(Src) 98.6 F (37 C) (Oral)  Resp 18  Ht 5' (1.524 m)  Wt 213 lb (96.616 kg)  BMI 41.60 kg/m2  SpO2 100%  LMP 07/26/2014   Physical Exam  General: Well-developed, well-nourished female in no acute distress; appearance  consistent with age of record HENT: normocephalic; atraumatic Eyes: pupils equal, round and reactive to light; extraocular muscles intact Neck: supple; nontender Heart: regular rate and rhythm Lungs: clear to auscultation bilaterally Chest: Nontender Abdomen: soft; nondistended; nontender; bowel sounds present Extremities: No deformity; full range of motion except right knee due to pain; right knee tender to palpation or passive range of motion, joint grossly stable, right lower extremity distally neurovascularly intact Neurologic: Awake, alert and oriented; motor function intact in all extremities and symmetric; no facial droop Skin: Warm and dry Psychiatric: Normal mood and affect    ED Course  Procedures (including critical care time)   MDM  Nursing notes and vitals signs, including pulse oximetry, reviewed.  Summary of this visit's results, reviewed by myself:  Imaging Studies: Dg Knee Complete 4 Views Right  07/27/2014   CLINICAL DATA:  Severe right knee pain status post fall.  EXAM: RIGHT KNEE - COMPLETE 4+ VIEW  COMPARISON:  None.  FINDINGS: No displaced fracture or dislocation. No aggressive osseous lesion or degenerative change. Small joint effusion.  IMPRESSION: No acute or aggressive osseous finding of the right knee. Small knee joint effusion.  Consider MRI follow-up if concern for internal derangement persists.   Electronically Signed   By: Jearld LeschAndrew  DelGaizo M.D.   On: 07/27/2014 00:55       Paula LibraJohn Keigan Girten, MD 07/27/14 [id number]

## 2014-11-12 ENCOUNTER — Encounter: Payer: Self-pay | Admitting: Family Medicine

## 2014-11-12 ENCOUNTER — Ambulatory Visit (INDEPENDENT_AMBULATORY_CARE_PROVIDER_SITE_OTHER): Payer: Self-pay | Admitting: Family Medicine

## 2014-11-12 VITALS — BP 122/76 | Temp 97.9°F | Resp 18 | Wt 221.6 lb

## 2014-11-12 DIAGNOSIS — J3089 Other allergic rhinitis: Secondary | ICD-10-CM

## 2014-11-12 DIAGNOSIS — N926 Irregular menstruation, unspecified: Secondary | ICD-10-CM

## 2014-11-12 LAB — POCT URINE PREGNANCY: Preg Test, Ur: NEGATIVE

## 2014-11-12 MED ORDER — CETIRIZINE HCL 10 MG PO TABS: 10.0000 mg | ORAL_TABLET | Freq: Every day | ORAL | Status: DC

## 2014-11-12 NOTE — Patient Instructions (Addendum)
Infertility WHAT IS INFERTILITY?  Infertility is usually defined as not being able to get pregnant after trying for one year of regular sexual intercourse without the use of contraceptives. Or not being able to carry a pregnancy to term and have a baby. The infertility rate in the [place] is around 10%. Pregnancy is the result of a chain of events. A woman must release an egg from one of her ovaries (ovulation). The egg must be fertilized by the female sperm. Then it travels through a fallopian tube into the uterus (womb), where it attaches to the wall of the uterus and grows. A man must have enough sperm, and the sperm must join with (fertilize) the egg along the way, at the proper time. The fertilized egg must then become attached to the inside of the uterus. While this may seem simple, many things can happen to prevent pregnancy from occurring.  WHOSE PROBLEM IS IT?  About 20% of infertility cases are due to problems with the man (female factors) and 65% are due to problems with the woman (female factors). Other cases are due to a combination of female and female factors or to unknown causes.  WHAT CAUSES INFERTILITY IN MEN?  Infertility in men is often caused by problems with making enough normal sperm or getting the sperm to reach the egg. Problems with sperm may exist from birth or develop later in life, due to illness or injury. Some men produce no sperm, or produce too few sperm (oligospermia). Other problems include:  Sexual dysfunction.  Hormonal or endocrine problems.  Age. Female fertility decreases with age, but not at as young an age as female fertility.  Infection.  Congenital problems. Birth defect, such as absence of the tubes that carry the sperm (vas deferens).  Genetic/chromosomal problems.  Antisperm antibody problems.  Retrograde ejaculation (sperm go into the bladder).  Varicoceles, spematoceles, or tumors of the testicles.  Lifestyle can influence the number and  quality of a man's sperm.  Alcohol and drugs can temporarily reduce sperm quality.  Environmental toxins, including pesticides and lead, may cause some cases of infertility in men. WHAT CAUSES INFERTILITY IN WOMEN?   Problems with ovulation account for most infertility in women. Without ovulation, eggs are not available to be fertilized.  Signs of problems with ovulation include irregular menstrual periods or no periods at all.  Simple lifestyle factors, including stress, diet, or athletic training, can affect a woman's hormonal balance.  Age. Fertility begins to decrease in women in the early 76s and is worse after age 71.  Much less often, a hormonal imbalance from a serious medical problem, such as a pituitary gland tumor, thyroid or other chronic medical disease, can cause ovulation problems.  Pelvic infections.  Polycystic ovary syndrome (increase in female hormones, unable to ovulate).  Alcohol or illegal drugs.  Environmental toxins, radiation, pesticides, and certain chemicals.  Aging is an important factor in female infertility.  The ability of a woman's ovaries to produce eggs declines with age, especially after age 27. About one third of couples where the woman is over 21 will have problems with fertility.  By the time she reaches menopause when her monthly periods stop for good, a woman can no longer produce eggs or become pregnant.  Other problems can also lead to infertility in women. If the fallopian tubes are blocked at one or both ends, the egg cannot travel through the tubes into the uterus. Scar tissue (adhesions) in the pelvis may cause blocked  tubes. This may result from pelvic inflammatory disease, endometriosis, or surgery for an ectopic pregnancy (fertilized egg implanted outside the uterus) or any pelvic or abdominal surgery causing adhesions.  Fibroid tumors or polyps of the uterus.  Congenital (birth defect) abnormalities of the uterus.  Infection of the  cervix (cervicitis).  Cervical stenosis (narrowing).  Abnormal cervical mucus.  Polycystic ovary syndrome.  Having sexual intercourse too often (every other day or 4 to 5 times a week).  Obesity.  Anorexia.  Poor nutrition.  Over exercising, with loss of body fat.  DES. Your mother received diethylstilbesterol hormone when pregnant with you. HOW IS INFERTILITY TESTED?  If you have been trying to have a baby without success, you may want to seek medical help. You should not wait for one year of trying before seeing a health care provider if:  You are over 35.  You have reason to believe that there may be a fertility problem. A medical evaluation may determine the reasons for a couple's infertility. Usually this process begins with:  Physical exams.  Medical histories of both partners.  Sexual histories of both partners. If there is no obvious problem, like improperly timed intercourse or absence of ovulation, tests may be needed.   For a man, testing usually begins with tests of his semen to look at:  The number of sperm.  The shape of sperm.  Movement of his sperm.  Taking a complete medical and surgical history.  Physical examination.  Check for infection of the female reproductive organs. Sometimes hormone tests are done.   For a woman, the first step in testing is to find out if she is ovulating each month. There are several ways to do this. For example, she can keep track of changes in her morning body temperature and in the texture of her cervical mucus. Another tool is a home ovulation test kit, which can be bought at drug or grocery stores.  Checks of ovulation can also be done in the doctor's office, using blood tests for hormone levels or ultrasound tests of the ovaries. If the woman is ovulating, more tests will need to be done. Some common female tests include:  Hysterosalpingogram: An x-ray of the fallopian tubes and uterus after they are injected with  dye. It shows if the tubes are open and shows the shape of the uterus.  Laparoscopy: An exam of the tubes and other female organs for disease. A lighted tube called a laparoscope is used to see inside the abdomen.  Endometrial biopsy: Sample of uterus tissue taken on the first day of the menstrual period, to see if the tissue indicates you are ovulating.  Transvaginal ultrasound: Examines the female organs.  Hysteroscopy: Uses a lighted tube to examine the cervix and inside the uterus, to see if there are any abnormalities inside the uterus. TREATMENT  Depending on the test results, different treatments can be suggested. The type of treatment depends on the cause. 85 to 90% of infertility cases are treated with drugs or surgery.   Various fertility drugs may be used for women with ovulation problems. It is important to talk with your caregiver about the drug to be used. You should understand the drug's benefits and side effects. Depending on the type of fertility drug and the dosage of the drug used, multiple births (twins or multiples) can occur in some women.  If needed, surgery can be done to repair damage to a woman's ovaries, fallopian tubes, cervix, or uterus.  Surgery  or medical treatment for endometriosis or polycystic ovary syndrome. Sometimes a man has an infertility problem that can be corrected with medicine or by surgery.  Intrauterine insemination (IUI) of sperm, timed with ovulation.  Change in lifestyle, if that is the cause (lose weight, increase exercise, and stop smoking, drinking excessively, or taking illegal drugs).  Other types of surgery:  Removing growths inside and on the uterus.  Removing scar tissue from inside of the uterus.  Fixing blocked tubes.  Removing scar tissue in the pelvis and around the female organs. WHAT IS ASSISTED REPRODUCTIVE TECHNOLOGY (ART)?  Assisted reproductive technology (ART) is another form of special methods used to help infertile  couples. ART involves handling both the woman's eggs and the man's sperm. Success rates vary and depend on many factors. ART can be expensive and time-consuming. But ART has made it possible for many couples to have children that otherwise would not have been conceived. Some methods are listed below:  In vitro fertilization (IVF). IVF is often used when a woman's fallopian tubes are blocked or when a man has low sperm counts. A drug is used to stimulate the ovaries to produce multiple eggs. Once mature, the eggs are removed and placed in a culture dish with the man's sperm for fertilization. After about 40 hours, the eggs are examined to see if they have become fertilized by the sperm and are dividing into cells. These fertilized eggs (embryos) are then placed in the woman's uterus. This bypasses the fallopian tubes.  Gamete intrafallopian transfer (GIFT) is similar to IVF, but used when the woman has at least one normal fallopian tube. Three to five eggs are placed in the fallopian tube, along with the man's sperm, for fertilization inside the woman's body.  Zygote intrafallopian transfer (ZIFT), also called tubal embryo transfer, combines IVF and GIFT. The eggs retrieved from the woman's ovaries are fertilized in the lab and placed in the fallopian tubes rather than in the uterus.  ART procedures sometimes involve the use of donor eggs (eggs from another woman) or previously frozen embryos. Donor eggs may be used if a woman has impaired ovaries or has a genetic disease that could be passed on to her baby.  When performing ART, you are at higher risk for resulting in multiple pregnancies, twins, triplets or more.  Intracytoplasma sperm injection is a procedure that injects a single sperm into the egg to fertilize it.  Embryo transplant is a procedure that starts after growing an embryo in a special media (chemical solution) developed to keep the embryo alive for 2 to 5 days, and then transplanting it  into the uterus. In cases where a cause cannot be found and pregnancy does not occur, adoption may be a consideration. Document Released: 03/26/2003 Document Revised: 06/15/2011 Document Reviewed: 02/19/2009 [employer] Patient Information 2015 [employer]. This information is not intended to replace advice given to you by your health care provider. Make sure you discuss any questions you have with your health care provider.  

## 2014-11-12 NOTE — Assessment & Plan Note (Signed)
Refer to gyn since pt is trying to get pregnant  PNV daily rto prn

## 2014-11-12 NOTE — Progress Notes (Signed)
Pre visit review using our clinic review tool, if applicable. No additional management support is needed unless otherwise documented below in the visit note. 

## 2014-11-12 NOTE — Progress Notes (Signed)
Patient ID: Geoffrey Mankin, female    DOB: 08-27-1990  Age: 24 y.o. MRN: [id number]    Subjective:  Subjective HPI Laurali Goddard presents c/o irregular periods.  She has been trying to get pregnant for over a year.  She is having 2 periods a month.  They last 5 days each.  She is also requesting zyrtec for allergies  Review of Systems  Constitutional: Negative for diaphoresis, appetite change, fatigue and unexpected weight change.  Eyes: Negative for pain, redness and visual disturbance.  Respiratory: Negative for cough, chest tightness, shortness of breath and wheezing.   Cardiovascular: Negative for chest pain, palpitations and leg swelling.  Endocrine: Negative for cold intolerance, heat intolerance, polydipsia, polyphagia and polyuria.  Genitourinary: Negative for dysuria, frequency and difficulty urinating.  Neurological: Negative for dizziness, light-headedness, numbness and headaches.    History Past Medical History  Diagnosis Date  . Anemia   . Allergy   . Eczema     She has past surgical history that includes Tonsillectomy and adenoidectomy (Bilateral, 01/02/2013).   Her family history includes Diabetes in her maternal grandfather; Hyperlipidemia in her father and mother; Hypertension in her father, maternal grandfather, maternal grandmother, mother, paternal grandfather, and paternal grandmother.She reports that she has never smoked. She does not have any smokeless tobacco history on file. She reports that she drinks alcohol. She reports that she does not use illicit drugs.  No current outpatient prescriptions on file prior to visit.   No current facility-administered medications on file prior to visit.     Objective:  Objective Physical Exam  Constitutional: She is oriented to person, place, and time. She appears well-developed and well-nourished.  HENT:  Head: Normocephalic and atraumatic.  Eyes: Conjunctivae and EOM are normal.  Neck: Normal range of motion.  Neck supple. No JVD present. Carotid bruit is not present. No thyromegaly present.  Cardiovascular: Normal rate, regular rhythm and normal heart sounds.   No murmur heard. Pulmonary/Chest: Effort normal and breath sounds normal. No respiratory distress. She has no wheezes. She has no rales. She exhibits no tenderness.  Musculoskeletal: She exhibits no edema.  Neurological: She is alert and oriented to person, place, and time.  Psychiatric: She has a normal mood and affect. Her behavior is normal.   BP 122/76 mmHg  Temp(Src) 97.9 F (36.6 C) (Oral)  Resp 18  Wt 221 lb 9.6 oz (100.517 kg)  LMP 11/10/2014 Wt Readings from Last 3 Encounters:  11/12/14 221 lb 9.6 oz (100.517 kg)  07/26/14 213 lb (96.616 kg)  12/21/13 206 lb 12.7 oz (93.8 kg)     Lab Results  Component Value Date   WBC 4.0* 01/17/2013   HGB 13.3 01/17/2013   HCT 39.1 01/17/2013   PLT 260.0 01/17/2013   GLUCOSE 129* 01/17/2013   ALT 23 01/17/2013   AST 20 01/17/2013   NA 141 01/17/2013   K 3.6 01/17/2013   CL 109 01/17/2013   CREATININE 0.9 01/17/2013   BUN 5* 01/17/2013   CO2 24 01/17/2013    Dg Knee Complete 4 Views Right  07/27/2014   CLINICAL DATA:  Severe right knee pain status post fall.  EXAM: RIGHT KNEE - COMPLETE 4+ VIEW  COMPARISON:  None.  FINDINGS: No displaced fracture or dislocation. No aggressive osseous lesion or degenerative change. Small joint effusion.  IMPRESSION: No acute or aggressive osseous finding of the right knee. Small knee joint effusion.  Consider MRI follow-up if concern for internal derangement persists.   Electronically Signed  By: Jearld Lesch M.D.   On: 07/27/2014 00:55     Assessment & Plan:  Plan I have discontinued Ms. Malachi's HYDROcodone-acetaminophen. I have also changed her cetirizine.  Meds ordered this encounter  Medications  . DISCONTD: cetirizine (ZYRTEC) 10 MG tablet    Sig: Take 10 mg by mouth daily.  . cetirizine (ZYRTEC) 10 MG tablet    Sig: Take 1  tablet (10 mg total) by mouth daily.    Dispense:  30 tablet    Refill:  11    Problem List Items Addressed This Visit    Irregular periods - Primary    Refer to gyn since pt is trying to get pregnant  PNV daily rto prn      Relevant Orders   POCT urine pregnancy (Completed)   Ambulatory referral to Gynecology    Other Visit Diagnoses    Other allergic rhinitis        Relevant Medications    cetirizine (ZYRTEC) 10 MG tablet       Follow-up: Return if symptoms worsen or fail to improve.  Loreen Freud, DO

## 2014-11-13 ENCOUNTER — Encounter: Payer: Self-pay | Admitting: Family Medicine

## 2015-01-14 ENCOUNTER — Emergency Department (HOSPITAL_BASED_OUTPATIENT_CLINIC_OR_DEPARTMENT_OTHER): Payer: Self-pay

## 2015-01-14 ENCOUNTER — Emergency Department (HOSPITAL_BASED_OUTPATIENT_CLINIC_OR_DEPARTMENT_OTHER)
Admission: EM | Admit: 2015-01-14 | Discharge: 2015-01-14 | Disposition: A | Discharge status: Home / Self Care | Payer: Self-pay | Attending: Emergency Medicine | Admitting: Emergency Medicine

## 2015-01-14 ENCOUNTER — Encounter (HOSPITAL_BASED_OUTPATIENT_CLINIC_OR_DEPARTMENT_OTHER): Payer: Self-pay | Admitting: *Deleted

## 2015-01-14 DIAGNOSIS — Z872 Personal history of diseases of the skin and subcutaneous tissue: Secondary | ICD-10-CM | POA: Insufficient documentation

## 2015-01-14 DIAGNOSIS — B9789 Other viral agents as the cause of diseases classified elsewhere: Secondary | ICD-10-CM

## 2015-01-14 DIAGNOSIS — Z79899 Other long term (current) drug therapy: Secondary | ICD-10-CM | POA: Insufficient documentation

## 2015-01-14 DIAGNOSIS — J069 Acute upper respiratory infection, unspecified: Secondary | ICD-10-CM

## 2015-01-14 DIAGNOSIS — Z862 Personal history of diseases of the blood and blood-forming organs and certain disorders involving the immune mechanism: Secondary | ICD-10-CM | POA: Insufficient documentation

## 2015-01-14 LAB — RAPID STREP SCREEN (MED CTR MEBANE ONLY): Streptococcus, Group A Screen (Direct): NEGATIVE

## 2015-01-14 MED ORDER — PREDNISONE 10 MG (21) PO TBPK: 10.0000 mg | ORAL_TABLET | Freq: Every day | ORAL | Status: DC

## 2015-01-14 NOTE — ED Notes (Signed)
Cough for a week.  

## 2015-01-14 NOTE — ED Notes (Signed)
Pt given rx x 1 for prednisone- d/c with family

## 2015-01-14 NOTE — Discharge Instructions (Signed)
Cool Mist Vaporizers °Vaporizers may help relieve the symptoms of a cough and cold. They add moisture to the air, which helps mucus to become thinner and less sticky. This makes it easier to breathe and cough up secretions. Cool mist vaporizers do not cause serious burns like hot mist vaporizers, which may also be called steamers or humidifiers. Vaporizers have not been proven to help with colds. You should not use a vaporizer if you are allergic to mold. °HOME CARE INSTRUCTIONS °· Follow the package instructions for the vaporizer. °· Do not use anything other than distilled water in the vaporizer. °· Do not run the vaporizer all of the time. This can cause mold or bacteria to grow in the vaporizer. °· Clean the vaporizer after each time it is used. °· Clean and dry the vaporizer well before storing it. °· Stop using the vaporizer if worsening respiratory symptoms develop. °  °This information is not intended to replace advice given to you by your health care provider. Make sure you discuss any questions you have with your health care provider. °  °Document Released: 12/19/2003 Document Revised: 03/28/2013 Document Reviewed: 08/10/2012 °Elsevier Interactive Patient Education ©2016 [employer] ° °Upper Respiratory Infection, Adult °Most upper respiratory infections (URIs) are a viral infection of the air passages leading to the lungs. A URI affects the nose, throat, and upper air passages. The most common type of URI is nasopharyngitis and is typically referred to as "the common cold." °URIs run their course and usually go away on their own. Most of the time, a URI does not require medical attention, but sometimes a bacterial infection in the upper airways can follow a viral infection. This is called a secondary infection. Sinus and middle ear infections are common types of secondary upper respiratory infections. °Bacterial pneumonia can also complicate a URI. A URI can worsen asthma and chronic obstructive  pulmonary disease (COPD). Sometimes, these complications can require emergency medical care and may be life threatening.  °CAUSES °Almost all URIs are caused by viruses. A virus is a type of germ and can spread from one person to another.  °RISKS FACTORS °You may be at risk for a URI if:  °· You smoke.   °· You have chronic heart or lung disease. °· You have a weakened defense (immune) system.   °· You are very young or very old.   °· You have nasal allergies or asthma. °· You work in crowded or poorly ventilated areas. °· You work in health care facilities or schools. °SIGNS AND SYMPTOMS  °Symptoms typically develop 2-3 days after you come in contact with a cold virus. Most viral URIs last 7-10 days. However, viral URIs from the influenza virus (flu virus) can last 14-18 days and are typically more severe. Symptoms may include:  °· Runny or stuffy (congested) nose.   °· Sneezing.   °· Cough.   °· Sore throat.   °· Headache.   °· Fatigue.   °· Fever.   °· Loss of appetite.   °· Pain in your forehead, behind your eyes, and over your cheekbones (sinus pain). °· Muscle aches.   °DIAGNOSIS  °Your health care provider may diagnose a URI by: °· Physical exam. °· Tests to check that your symptoms are not due to another condition such as: °¨ Strep throat. °¨ Sinusitis. °¨ Pneumonia. °¨ Asthma. °TREATMENT  °A URI goes away on its own with time. It cannot be cured with medicines, but medicines may be prescribed or recommended to relieve symptoms. Medicines may help: °· Reduce your fever. °· Reduce   your cough.  Relieve nasal congestion. HOME CARE INSTRUCTIONS   Take medicines only as directed by your health care provider.   Gargle warm saltwater or take cough drops to comfort your throat as directed by your health care provider.  Use a warm mist humidifier or inhale steam from a shower to increase air moisture. This may make it easier to breathe.  Drink enough fluid to keep your urine clear or pale yellow.   Eat  soups and other clear broths and maintain good nutrition.   Rest as needed.   Return to work when your temperature has returned to normal or as your health care provider advises. You may need to stay home longer to avoid infecting others. You can also use a face mask and careful hand washing to prevent spread of the virus.  Increase the usage of your inhaler if you have asthma.   Do not use any tobacco products, including cigarettes, chewing tobacco, or electronic cigarettes. If you need help quitting, ask your health care provider. PREVENTION  The best way to protect yourself from getting a cold is to practice good hygiene.   Avoid oral or hand contact with people with cold symptoms.   Wash your hands often if contact occurs.  There is no clear evidence that vitamin C, vitamin E, echinacea, or exercise reduces the chance of developing a cold. However, it is always recommended to get plenty of rest, exercise, and practice good nutrition.  SEEK MEDICAL CARE IF:   You are getting worse rather than better.   Your symptoms are not controlled by medicine.   You have chills.  You have worsening shortness of breath.  You have brown or red mucus.  You have yellow or brown nasal discharge.  You have pain in your face, especially when you bend forward.  You have a fever.  You have swollen neck glands.  You have pain while swallowing.  You have white areas in the back of your throat. SEEK IMMEDIATE MEDICAL CARE IF:   You have severe or persistent:  Headache.  Ear pain.  Sinus pain.  Chest pain.  You have chronic lung disease and any of the following:  Wheezing.  Prolonged cough.  Coughing up blood.  A change in your usual mucus.  You have a stiff neck.  You have changes in your:  Vision.  Hearing.  Thinking.  Mood. MAKE SURE YOU:   Understand these instructions.  Will watch your condition.  Will get help right away if you are not doing well or  get worse.   This information is not intended to replace advice given to you by your health care provider. Make sure you discuss any questions you have with your health care provider.   Take NSAIDS as needed for pain. May take cough suppressant or cough syrup medication at night. Return to the emergency department if she experienced fevers, vomiting, worsening of your symptoms, shortness of breath, chest pain

## 2015-01-15 NOTE — ED Provider Notes (Signed)
CSN: [id number]     Arrival date & time 01/14/15  1736 History   First MD Initiated Contact with Patient 01/14/15 1746     Chief Complaint  Patient presents with  . Cough     (Consider location/radiation/quality/duration/timing/severity/associated sxs/prior Treatment) HPI   Cheryl Mullen is a 24 y.o F with no significant past medical history who presents the emergency department complaining of cough for the last 2 weeks, sore throat. Patient states that 2 weeks ago she had symptoms of congestion and runny nose. Then she began to cough which has persisted for the last 2 weeks. The cough is nonproductive. However now when she coughs her chest hurts. Denies fever, chills, dizziness, weakness, vomiting, diarrhea. Patient has not had her flu shot.  Past Medical History  Diagnosis Date  . Anemia   . Allergy   . Eczema    Past Surgical History  Procedure Laterality Date  . Tonsillectomy and adenoidectomy Bilateral 01/02/2013   Family History  Problem Relation Age of Onset  . Hypertension Mother   . Hyperlipidemia Mother   . Hypertension Father   . Hyperlipidemia Father   . Hypertension Maternal Grandmother   . Hypertension Maternal Grandfather   . Diabetes Maternal Grandfather   . Hypertension Paternal Grandmother   . Hypertension Paternal Grandfather    Social History  Substance Use Topics  . Smoking status: Never Smoker   . Smokeless tobacco: None  . Alcohol Use: Yes   OB History    No data available     Review of Systems  All other systems reviewed and are negative.     Allergies  Review of patient's allergies indicates no known allergies.  Home Medications   Prior to Admission medications   Medication Sig Start Date End Date Taking? Authorizing Provider  cetirizine (ZYRTEC) 10 MG tablet Take 1 tablet (10 mg total) by mouth daily. 11/12/14   Lelon Perla, DO  predniSONE (STERAPRED UNI-PAK 21 TAB) 10 MG (21) TBPK tablet Take 1 tablet (10 mg total) by mouth  daily. Take 6 tabs by mouth daily  for 2 days, then 5 tabs for 2 days, then 4 tabs for 2 days, then 3 tabs for 2 days, 2 tabs for 2 days, then 1 tab by mouth daily for 2 days 01/14/15   Lelon Mast Tripp Dowless, PA-C   BP 113/64 mmHg  Pulse 94  Temp(Src) 98.9 F (37.2 C) (Oral)  Resp 18  Ht 5' (1.524 m)  Wt 221 lb (100.245 kg)  BMI 43.16 kg/m2  SpO2 100% Physical Exam  Constitutional: She is oriented to person, place, and time. She appears well-developed and well-nourished. No distress.  HENT:  Head: Normocephalic and atraumatic.  Mouth/Throat: Oropharynx is clear and moist. No oropharyngeal exudate.  Eyes: Conjunctivae and EOM are normal. Pupils are equal, round, and reactive to light. Right eye exhibits no discharge. Left eye exhibits no discharge. No scleral icterus.  Neck: Normal range of motion. Neck supple.  Cardiovascular: Normal rate, regular rhythm, normal heart sounds and intact distal pulses.  Exam reveals no gallop and no friction rub.   No murmur heard. Pulmonary/Chest: Effort normal and breath sounds normal. No respiratory distress. She has no wheezes. She has no rales. She exhibits no tenderness.  Abdominal: Soft. Bowel sounds are normal. She exhibits no distension and no mass. There is no tenderness. There is no rebound and no guarding.  Musculoskeletal: Normal range of motion. She exhibits no edema.  Lymphadenopathy:    She has no  cervical adenopathy.  Neurological: She is alert and oriented to person, place, and time.  Skin: Skin is warm and dry. No rash noted. She is not diaphoretic. No erythema. No pallor.  Psychiatric: She has a normal mood and affect. Her behavior is normal.  Nursing note and vitals reviewed.   ED Course  Procedures (including critical care time) Labs Review Labs Reviewed  RAPID STREP SCREEN (NOT AT [facility])  CULTURE, GROUP A STREP    Imaging Review Dg Chest 2 View  01/14/2015   CLINICAL DATA:  24 year old female with cough and sore throat  for 1 week. Chest pain for several days. Initial encounter.  EXAM: CHEST  2 VIEW  COMPARISON:  Chest x-ray report 01/13/2006 (no images available).  FINDINGS: Large body habitus. Lung volumes are within normal limits. Normal cardiac size and mediastinal contours. Visualized tracheal air column is within normal limits. The lungs are clear. No pneumothorax or pleural effusion. Mild thoracic scoliosis. No acute osseous abnormality identified.  IMPRESSION: Negative, no acute cardiopulmonary abnormality.   Electronically Signed   By: Odessa Fleming M.D.   On: 01/14/2015 18:35   I have personally reviewed and evaluated these images and lab results as part of my medical decision-making.   EKG Interpretation None      MDM   Final diagnoses:  Viral URI with cough    Patient with cough the last 2 weeks, nonproductive. Afebrile. Vital signs stable. No segmental past medical history. Chest x-ray negative for cardiopulmonary disease. Rapid strep test negative. Symptoms likely due to viral URI. Patient educated that cough may persist for the next couple of weeks. We'll give steroids to help with inflammation and costochondritis. Patient reassured that this is likely due to a virus and is not a bacterial cause. No evidence of pneumonia.  Return precautions outlined in patient discharge instructions.      Lester Kinsman Glasgow, PA-C 01/15/15 0118  Leta Baptist, MD 01/15/15 [phone]

## 2015-01-16 LAB — CULTURE, GROUP A STREP: Strep A Culture: NEGATIVE

## 2015-02-05 ENCOUNTER — Encounter: Payer: Self-pay | Admitting: Family Medicine

## 2015-02-07 ENCOUNTER — Other Ambulatory Visit (HOSPITAL_COMMUNITY)
Admission: RE | Admit: 2015-02-07 | Discharge: 2015-02-07 | Disposition: A | Discharge status: Home / Self Care | Payer: Self-pay | Source: Ambulatory Visit | Attending: Family Medicine | Admitting: Family Medicine

## 2015-02-07 ENCOUNTER — Ambulatory Visit (INDEPENDENT_AMBULATORY_CARE_PROVIDER_SITE_OTHER): Payer: Self-pay | Admitting: Family Medicine

## 2015-02-07 ENCOUNTER — Encounter: Payer: Self-pay | Admitting: Family Medicine

## 2015-02-07 VITALS — BP 121/82 | HR 76 | Temp 98.4°F | Wt 216.8 lb

## 2015-02-07 DIAGNOSIS — N760 Acute vaginitis: Secondary | ICD-10-CM | POA: Insufficient documentation

## 2015-02-07 DIAGNOSIS — R82998 Other abnormal findings in urine: Secondary | ICD-10-CM

## 2015-02-07 DIAGNOSIS — Z3009 Encounter for other general counseling and advice on contraception: Secondary | ICD-10-CM

## 2015-02-07 DIAGNOSIS — L309 Dermatitis, unspecified: Secondary | ICD-10-CM

## 2015-02-07 DIAGNOSIS — N390 Urinary tract infection, site not specified: Secondary | ICD-10-CM

## 2015-02-07 DIAGNOSIS — R300 Dysuria: Secondary | ICD-10-CM

## 2015-02-07 DIAGNOSIS — N926 Irregular menstruation, unspecified: Secondary | ICD-10-CM

## 2015-02-07 LAB — POCT URINALYSIS DIPSTICK
Bilirubin, UA: NEGATIVE
Blood, UA: NEGATIVE
Glucose, UA: NEGATIVE
Ketones, UA: NEGATIVE
NITRITE UA: NEGATIVE
Protein, UA: NEGATIVE
Spec Grav, UA: 1.02
UROBILINOGEN UA: 2
pH, UA: 7

## 2015-02-07 MED ORDER — FLUCONAZOLE 150 MG PO TABS: ORAL_TABLET | ORAL | Status: DC

## 2015-02-07 MED ORDER — TRIAMCINOLONE ACETONIDE 0.1 % EX OINT: 1.0000 "application " | TOPICAL_OINTMENT | Freq: Two times a day (BID) | CUTANEOUS | Status: DC

## 2015-02-07 NOTE — Progress Notes (Signed)
Pre visit review using our clinic review tool, if applicable. No additional management support is needed unless otherwise documented below in the visit note. 

## 2015-02-07 NOTE — Addendum Note (Signed)
Addended by: Arnette NorrisPAYNE, Cheyeanne Roadcap P on: 02/07/2015 04:58 PM   Modules accepted: Orders

## 2015-02-07 NOTE — Assessment & Plan Note (Signed)
Wet prep done Diflucan 150 mg 1 po qd x 1 Call or rto prn

## 2015-02-07 NOTE — Patient Instructions (Signed)
Eczema Eczema, also called atopic dermatitis, is a skin disorder that causes inflammation of the skin. It causes a red rash and dry, scaly skin. The skin becomes very itchy. Eczema is generally worse during the cooler winter months and often improves with the warmth of summer. Eczema usually starts showing signs in infancy. Some children outgrow eczema, but it may last through adulthood.  CAUSES  The exact cause of eczema is not known, but it appears to run in families. People with eczema often have a family history of eczema, allergies, asthma, or hay fever. Eczema is not contagious. Flare-ups of the condition may be caused by:   Contact with something you are sensitive or allergic to.   Stress. SIGNS AND SYMPTOMS  Dry, scaly skin.   Red, itchy rash.   Itchiness. This may occur before the skin rash and may be very intense.  DIAGNOSIS  The diagnosis of eczema is usually made based on symptoms and medical history. TREATMENT  Eczema cannot be cured, but symptoms usually can be controlled with treatment and other strategies. A treatment plan might include:  Controlling the itching and scratching.   Use over-the-counter antihistamines as directed for itching. This is especially useful at night when the itching tends to be worse.   Use over-the-counter steroid creams as directed for itching.   Avoid scratching. Scratching makes the rash and itching worse. It may also result in a skin infection (impetigo) due to a break in the skin caused by scratching.   Keeping the skin well moisturized with creams every day. This will seal in moisture and help prevent dryness. Lotions that contain alcohol and water should be avoided because they can dry the skin.   Limiting exposure to things that you are sensitive or allergic to (allergens).   Recognizing situations that cause stress.   Developing a plan to manage stress.  HOME CARE INSTRUCTIONS   Only take over-the-counter or  prescription medicines as directed by your health care provider.   Do not use anything on the skin without checking with your health care provider.   Keep baths or showers short (5 minutes) in warm (not hot) water. Use mild cleansers for bathing. These should be unscented. You may add nonperfumed bath oil to the bath water. It is best to avoid soap and bubble bath.   Immediately after a bath or shower, when the skin is still damp, apply a moisturizing ointment to the entire body. This ointment should be a petroleum ointment. This will seal in moisture and help prevent dryness. The thicker the ointment, the better. These should be unscented.   Keep fingernails cut short. Children with eczema may need to wear soft gloves or mittens at night after applying an ointment.   Dress in clothes made of cotton or cotton blends. Dress lightly, because heat increases itching.   A child with eczema should stay away from anyone with fever blisters or cold sores. The virus that causes fever blisters (herpes simplex) can cause a serious skin infection in children with eczema. SEEK MEDICAL CARE IF:   Your itching interferes with sleep.   Your rash gets worse or is not better within 1 week after starting treatment.   You see pus or soft yellow scabs in the rash area.   You have a fever.   You have a rash flare-up after contact with someone who has fever blisters.    This information is not intended to replace advice given to you by your health care   provider. Make sure you discuss any questions you have with your health care provider.   Document Released: 03/20/2000 Document Revised: 01/11/2013 Document Reviewed: 10/24/2012 Elsevier Interactive Patient Education 2016 Elsevier Inc.  

## 2015-02-07 NOTE — Addendum Note (Signed)
Addended by: Marylouise StacksARTER, Brandice Busser E on: 02/07/2015 01:57 PM   Modules accepted: Orders

## 2015-02-08 LAB — URINE CULTURE
Colony Count: NO GROWTH
ORGANISM ID, BACTERIA: NO GROWTH

## 2015-02-10 LAB — CERVICOVAGINAL ANCILLARY ONLY: WET PREP (BD AFFIRM): POSITIVE — AB

## 2015-02-11 ENCOUNTER — Other Ambulatory Visit: Payer: Self-pay

## 2015-02-11 MED ORDER — METRONIDAZOLE 500 MG PO TABS: 500.0000 mg | ORAL_TABLET | Freq: Two times a day (BID) | ORAL | Status: DC

## 2015-02-14 ENCOUNTER — Encounter: Payer: Self-pay | Admitting: Family Medicine

## 2015-02-25 ENCOUNTER — Encounter: Payer: Self-pay | Admitting: Obstetrics & Gynecology

## 2015-02-25 DIAGNOSIS — N926 Irregular menstruation, unspecified: Secondary | ICD-10-CM

## 2015-03-20 ENCOUNTER — Other Ambulatory Visit: Payer: Self-pay | Admitting: Family Medicine

## 2015-03-20 DIAGNOSIS — L309 Dermatitis, unspecified: Secondary | ICD-10-CM

## 2015-03-21 MED ORDER — TRIAMCINOLONE ACETONIDE 0.1 % EX OINT: 1.0000 "application " | TOPICAL_OINTMENT | Freq: Two times a day (BID) | CUTANEOUS | Status: DC

## 2015-04-22 ENCOUNTER — Telehealth: Payer: Self-pay | Admitting: Family Medicine

## 2015-04-29 ENCOUNTER — Encounter (HOSPITAL_BASED_OUTPATIENT_CLINIC_OR_DEPARTMENT_OTHER): Payer: Self-pay | Admitting: *Deleted

## 2015-04-29 ENCOUNTER — Emergency Department (HOSPITAL_BASED_OUTPATIENT_CLINIC_OR_DEPARTMENT_OTHER): Payer: Self-pay

## 2015-04-29 ENCOUNTER — Emergency Department (HOSPITAL_BASED_OUTPATIENT_CLINIC_OR_DEPARTMENT_OTHER)
Admission: EM | Admit: 2015-04-29 | Discharge: 2015-04-29 | Disposition: A | Discharge status: Home / Self Care | Payer: Self-pay | Attending: Emergency Medicine | Admitting: Emergency Medicine

## 2015-04-29 DIAGNOSIS — R69 Illness, unspecified: Secondary | ICD-10-CM

## 2015-04-29 DIAGNOSIS — J0110 Acute frontal sinusitis, unspecified: Secondary | ICD-10-CM

## 2015-04-29 DIAGNOSIS — Z79899 Other long term (current) drug therapy: Secondary | ICD-10-CM | POA: Insufficient documentation

## 2015-04-29 DIAGNOSIS — Z872 Personal history of diseases of the skin and subcutaneous tissue: Secondary | ICD-10-CM | POA: Insufficient documentation

## 2015-04-29 DIAGNOSIS — Z862 Personal history of diseases of the blood and blood-forming organs and certain disorders involving the immune mechanism: Secondary | ICD-10-CM | POA: Insufficient documentation

## 2015-04-29 DIAGNOSIS — Z3202 Encounter for pregnancy test, result negative: Secondary | ICD-10-CM | POA: Insufficient documentation

## 2015-04-29 DIAGNOSIS — Z7952 Long term (current) use of systemic steroids: Secondary | ICD-10-CM | POA: Insufficient documentation

## 2015-04-29 DIAGNOSIS — J111 Influenza due to unidentified influenza virus with other respiratory manifestations: Secondary | ICD-10-CM | POA: Insufficient documentation

## 2015-04-29 LAB — PREGNANCY, URINE: Preg Test, Ur: NEGATIVE

## 2015-04-29 MED ORDER — BENZONATATE 100 MG PO CAPS
ORAL_CAPSULE | ORAL | Status: AC
  Filled 2015-04-29: qty 1

## 2015-04-29 MED ORDER — BENZONATATE 100 MG PO CAPS
100.0000 mg | ORAL_CAPSULE | Freq: Once | ORAL | Status: AC
  Administered 2015-04-29: 100 mg via ORAL
  Filled 2015-04-29: qty 1

## 2015-04-29 MED ORDER — AMOXICILLIN-POT CLAVULANATE 875-125 MG PO TABS: 1.0000 | ORAL_TABLET | Freq: Two times a day (BID) | ORAL | Status: DC

## 2015-04-29 MED ORDER — ACETAMINOPHEN 500 MG PO TABS
1000.0000 mg | ORAL_TABLET | Freq: Once | ORAL | Status: AC
  Administered 2015-04-29: 1000 mg via ORAL
  Filled 2015-04-29: qty 2

## 2015-04-29 MED ORDER — IBUPROFEN 800 MG PO TABS
800.0000 mg | ORAL_TABLET | Freq: Once | ORAL | Status: AC
  Administered 2015-04-29: 800 mg via ORAL
  Filled 2015-04-29: qty 1

## 2015-04-29 NOTE — Discharge Instructions (Signed)
Sinusitis, Adult  Sinusitis is redness, soreness, and puffiness (inflammation) of the air pockets in the bones of your face (sinuses). The redness, soreness, and puffiness can cause air and mucus to get trapped in your sinuses. This can allow germs to grow and cause an infection.   HOME CARE    Drink enough fluids to keep your pee (urine) clear or pale yellow.   Use a humidifier in your home.   Run a hot shower to create steam in the bathroom. Sit in the bathroom with the door closed. Breathe in the steam 3-4 times a day.   Put a warm, moist washcloth on your face 3-4 times a day, or as told by your doctor.   Use salt water sprays (saline sprays) to wet the thick fluid in your nose. This can help the sinuses drain.   Only take medicine as told by your doctor.  GET HELP RIGHT AWAY IF:    Your pain gets worse.   You have very bad headaches.   You are sick to your stomach (nauseous).   You throw up (vomit).   You are very sleepy (drowsy) all the time.   Your face is puffy (swollen).   Your vision changes.   You have a stiff neck.   You have trouble breathing.  MAKE SURE YOU:    Understand these instructions.   Will watch your condition.   Will get help right away if you are not doing well or get worse.     This information is not intended to replace advice given to you by your health care provider. Make sure you discuss any questions you have with your health care provider.     Document Released: 09/09/2007 Document Revised: 04/13/2014 Document Reviewed: 10/27/2011  Elsevier Interactive Patient Education 2016 Elsevier Inc.

## 2015-04-29 NOTE — ED Notes (Signed)
Pt amb to room 6 with quick steady gait. Pt reports cough x Friday, with onset of congestion and ha x yesterday. Pt also reports onset of productive cough yesterday producing yellow phlegm.

## 2015-04-29 NOTE — ED Provider Notes (Signed)
CSN: [id number]     Arrival date & time 04/29/15  0847 History   First MD Initiated Contact with Patient 04/29/15 [phone]     Chief Complaint  Patient presents with  . Cough     (Consider location/radiation/quality/duration/timing/severity/associated sxs/prior Treatment) Patient is a 25 y.o. female presenting with cough and general illness. The history is provided by the patient.  Cough Associated symptoms: headaches and rhinorrhea   Associated symptoms: no chest pain, no chills, no fever, no myalgias, no shortness of breath and no wheezing   Illness Severity:  Moderate Onset quality:  Sudden Duration:  2 days Timing:  Constant Progression:  Worsening Chronicity:  New Associated symptoms: congestion, cough, headaches and rhinorrhea   Associated symptoms: no chest pain, no fever, no myalgias, no nausea, no shortness of breath, no vomiting and no wheezing     25 yo F with a chief complaint of URI like symptoms. Cough congestion myalgias and headache. This been going on for about 3 or 4 days. Subjective fevers at home. Denies sick contacts. Headache is frontal worse with coughing. Sharp pain. Nothing seemed to work for this at home. Eating and drinking normally.  Past Medical History  Diagnosis Date  . Anemia   . Allergy   . Eczema    Past Surgical History  Procedure Laterality Date  . Tonsillectomy and adenoidectomy Bilateral 01/02/2013   Family History  Problem Relation Age of Onset  . Hypertension Mother   . Hyperlipidemia Mother   . Hypertension Father   . Hyperlipidemia Father   . Hypertension Maternal Grandmother   . Hypertension Maternal Grandfather   . Diabetes Maternal Grandfather   . Hypertension Paternal Grandmother   . Hypertension Paternal Grandfather    Social History  Substance Use Topics  . Smoking status: Never Smoker   . Smokeless tobacco: None  . Alcohol Use: Yes   OB History    No data available     Review of Systems  Constitutional: Negative  for fever and chills.  HENT: Positive for congestion, postnasal drip and rhinorrhea.   Eyes: Negative for redness and visual disturbance.  Respiratory: Positive for cough. Negative for shortness of breath and wheezing.   Cardiovascular: Negative for chest pain and palpitations.  Gastrointestinal: Negative for nausea and vomiting.  Genitourinary: Negative for dysuria and urgency.  Musculoskeletal: Negative for myalgias and arthralgias.  Skin: Negative for pallor and wound.  Neurological: Positive for headaches. Negative for dizziness.      Allergies  Review of patient's allergies indicates no known allergies.  Home Medications   Prior to Admission medications   Medication Sig Start Date End Date Taking? Authorizing Provider  amoxicillin-clavulanate (AUGMENTIN) 875-125 MG tablet Take 1 tablet by mouth every 12 (twelve) hours. 04/29/15   Melene Plan, DO  cetirizine (ZYRTEC) 10 MG tablet Take 1 tablet (10 mg total) by mouth daily. 11/12/14   Lelon Perla, DO  triamcinolone ointment (KENALOG) 0.1 % Apply 1 application topically 2 (two) times daily. 03/21/15   Yvonne R Lowne, DO   BP 142/91 mmHg  Pulse 94  Temp(Src) 99 F (37.2 C) (Oral)  Resp 20  Ht 5' (1.524 m)  Wt 200 lb (90.719 kg)  BMI 39.06 kg/m2  SpO2 98%  LMP 04/05/2015 Physical Exam  Constitutional: She is oriented to person, place, and time. She appears well-developed and well-nourished. No distress.  HENT:  Head: Normocephalic and atraumatic.  Swollen turbinates. Exquisite tenderness to palpation of the frontal sinuses bilaterally. TMs with purulent effusion  bilaterally no erythema or bulging.  Eyes: EOM are normal. Pupils are equal, round, and reactive to light.  Neck: Normal range of motion. Neck supple.  Cardiovascular: Normal rate and regular rhythm.  Exam reveals no gallop and no friction rub.   No murmur heard. Pulmonary/Chest: Effort normal. She has no wheezes. She has no rales.  Abdominal: Soft. She exhibits no  distension. There is no tenderness.  Musculoskeletal: She exhibits no edema or tenderness.  Neurological: She is alert and oriented to person, place, and time.  Skin: Skin is warm and dry. She is not diaphoretic.  Psychiatric: She has a normal mood and affect. Her behavior is normal.  Nursing note and vitals reviewed.   ED Course  Procedures (including critical care time) Labs Review Labs Reviewed  PREGNANCY, URINE    Imaging Review Dg Chest 2 View  04/29/2015  CLINICAL DATA:  Cough over the past 2 weeks. EXAM: CHEST  2 VIEW COMPARISON:  01/14/2015 FINDINGS: The heart size and mediastinal contours are within normal limits. Both lungs are clear. The visualized skeletal structures are unremarkable. IMPRESSION: No active cardiopulmonary disease. Electronically Signed   By: Gaylyn Rong M.D.   On: 04/29/2015 09:03   I have personally reviewed and evaluated these images and lab results as part of my medical decision-making.   EKG Interpretation None      MDM   Final diagnoses:  Influenza-like illness  Acute frontal sinusitis, recurrence not specified    25 yo F with a chief complaints of a URI. On exam patient with exquisite tenderness over the frontal sinus. Will treat for sinusitis. Tylenol or ibuprofen push fluids. PCP follow-up.  9:59 AM:  I have discussed the diagnosis/risks/treatment options with the patient and family and believe the pt to be eligible for discharge home to follow-up with PCP. We also discussed returning to the ED immediately if new or worsening sx occur. We discussed the sx which are most concerning (e.g., sudden worsening pain, fever, inability to tolerate by mouth) that necessitate immediate return. Medications administered to the patient during their visit and any new prescriptions provided to the patient are listed below.  Medications given during this visit Medications  benzonatate (TESSALON) capsule 100 mg (100 mg Oral Given 04/29/15 0907)   acetaminophen (TYLENOL) tablet 1,000 mg (1,000 mg Oral Given 04/29/15 0907)  benzonatate (TESSALON) 100 MG capsule ( Oral Duplicate 04/29/15 0950)  ibuprofen (ADVIL,MOTRIN) tablet 800 mg (800 mg Oral Given 04/29/15 0955)    New Prescriptions   AMOXICILLIN-CLAVULANATE (AUGMENTIN) 875-125 MG TABLET    Take 1 tablet by mouth every 12 (twelve) hours.    The patient appears reasonably screen and/or stabilized for discharge and I doubt any other medical condition or other [facility] requiring further screening, evaluation, or treatment in the ED at this time prior to discharge.      Melene Plan, DO 04/29/15 [phone]

## 2015-05-08 NOTE — Telephone Encounter (Signed)
.  mychart

## 2015-05-28 ENCOUNTER — Encounter: Payer: Self-pay | Admitting: Family Medicine

## 2015-05-30 ENCOUNTER — Ambulatory Visit (INDEPENDENT_AMBULATORY_CARE_PROVIDER_SITE_OTHER): Payer: Self-pay | Admitting: Family Medicine

## 2015-05-30 ENCOUNTER — Encounter: Payer: Self-pay | Admitting: Family Medicine

## 2015-05-30 VITALS — BP 120/70 | Temp 98.6°F | Resp 18 | Wt 211.8 lb

## 2015-05-30 DIAGNOSIS — L309 Dermatitis, unspecified: Secondary | ICD-10-CM

## 2015-05-30 DIAGNOSIS — F410 Panic disorder [episodic paroxysmal anxiety] without agoraphobia: Secondary | ICD-10-CM

## 2015-05-30 MED ORDER — TRIAMCINOLONE ACETONIDE 0.1 % EX OINT: 1.0000 "application " | TOPICAL_OINTMENT | Freq: Two times a day (BID) | CUTANEOUS | Status: DC

## 2015-05-30 MED ORDER — ALPRAZOLAM 0.25 MG PO TABS: 0.2500 mg | ORAL_TABLET | Freq: Two times a day (BID) | ORAL | Status: DC | PRN

## 2015-05-30 NOTE — Progress Notes (Signed)
Pre visit review using our clinic review tool, if applicable. No additional management support is needed unless otherwise documented below in the visit note. 

## 2015-05-30 NOTE — Progress Notes (Signed)
Patient ID: Cheryl Mullen, female    DOB: 1991/03/03  Age: 25 y.o. MRN: [id number]    Subjective:  Subjective HPI Staceyann Knouff presents c/o increase stress and headaches, and panic attacks.  Heart races with these episodes as well.   Pt work was causing stress but she quit and is is getting ready to start a new job.    Review of Systems  Constitutional: Negative for diaphoresis, appetite change, fatigue and unexpected weight change.  Eyes: Negative for pain, redness and visual disturbance.  Respiratory: Negative for cough, chest tightness, shortness of breath and wheezing.   Cardiovascular: Negative for chest pain, palpitations and leg swelling.  Endocrine: Negative for cold intolerance, heat intolerance, polydipsia, polyphagia and polyuria.  Genitourinary: Negative for dysuria, frequency and difficulty urinating.  Neurological: Negative for dizziness, light-headedness, numbness and headaches.    History Past Medical History  Diagnosis Date  . Anemia   . Allergy   . Eczema     She has past surgical history that includes Tonsillectomy and adenoidectomy (Bilateral, 01/02/2013).   Her family history includes Diabetes in her maternal grandfather; Hyperlipidemia in her father and mother; Hypertension in her father, maternal grandfather, maternal grandmother, mother, paternal grandfather, and paternal grandmother.She reports that she has never smoked. She does not have any smokeless tobacco history on file. She reports that she drinks alcohol. She reports that she does not use illicit drugs.  Current Outpatient Prescriptions on File Prior to Visit  Medication Sig Dispense Refill  . cetirizine (ZYRTEC) 10 MG tablet Take 1 tablet (10 mg total) by mouth daily. 30 tablet 11   No current facility-administered medications on file prior to visit.     Objective:  Objective Physical Exam  Constitutional: She is oriented to person, place, and time. She appears well-developed and  well-nourished.  HENT:  Head: Normocephalic and atraumatic.  Eyes: Conjunctivae and EOM are normal.  Neck: Normal range of motion. Neck supple. No JVD present. Carotid bruit is not present. No thyromegaly present.  Cardiovascular: Normal rate, regular rhythm and normal heart sounds.   No murmur heard. Pulmonary/Chest: Effort normal and breath sounds normal. No respiratory distress. She has no wheezes. She has no rales. She exhibits no tenderness.  Musculoskeletal: She exhibits no edema.  Neurological: She is alert and oriented to person, place, and time.  Psychiatric: Her speech is normal and behavior is normal. Judgment and thought content normal. Her mood appears anxious. Cognition and memory are normal.  Pt states she gets panic attacks everytime she goes to work.  The headaches come on and she gets palpations.     Nursing note and vitals reviewed.  BP 120/70 mmHg  Temp(Src) 98.6 F (37 C) (Oral)  Resp 18  Wt 211 lb 12.8 oz (96.072 kg) Wt Readings from Last 3 Encounters:  05/30/15 211 lb 12.8 oz (96.072 kg)  04/29/15 200 lb (90.719 kg)  02/07/15 216 lb 12.8 oz (98.34 kg)     Lab Results  Component Value Date   WBC 4.0* 01/17/2013   HGB 13.3 01/17/2013   HCT 39.1 01/17/2013   PLT 260.0 01/17/2013   GLUCOSE 129* 01/17/2013   ALT 23 01/17/2013   AST 20 01/17/2013   NA 141 01/17/2013   K 3.6 01/17/2013   CL 109 01/17/2013   CREATININE 0.9 01/17/2013   BUN 5* 01/17/2013   CO2 24 01/17/2013    Dg Chest 2 View  04/29/2015  CLINICAL DATA:  Cough over the past 2 weeks. EXAM: CHEST  2  VIEW COMPARISON:  01/14/2015 FINDINGS: The heart size and mediastinal contours are within normal limits. Both lungs are clear. The visualized skeletal structures are unremarkable. IMPRESSION: No active cardiopulmonary disease. Electronically Signed   By: Gaylyn Rong M.D.   On: 04/29/2015 09:03     Assessment & Plan:  Plan I have discontinued Ms. Malachi's amoxicillin-clavulanate. I am  also having her start on ALPRAZolam. Additionally, I am having her maintain her cetirizine and triamcinolone ointment.  Meds ordered this encounter  Medications  . triamcinolone ointment (KENALOG) 0.1 %    Sig: Apply 1 application topically 2 (two) times daily.    Dispense:  30 g    Refill:  0  . ALPRAZolam (XANAX) 0.25 MG tablet    Sig: Take 1 tablet (0.25 mg total) by mouth 2 (two) times daily as needed for anxiety.    Dispense:  20 tablet    Refill:  0    Problem List Items Addressed This Visit      Unprioritized   Panic    Pt feels her stress will resolve with new job She refused counseling at this time  She refused any other meds for now rto prn      Relevant Medications   ALPRAZolam (XANAX) 0.25 MG tablet   Eczema - Primary   Relevant Medications   triamcinolone ointment (KENALOG) 0.1 %    Other Visit Diagnoses    Panic attacks        Relevant Medications    ALPRAZolam (XANAX) 0.25 MG tablet       Follow-up: Return in about 4 weeks (around 06/27/2015), or if symptoms worsen or fail to improve, for anxiety, panic.  Loreen Freud, DO

## 2015-05-31 DIAGNOSIS — F410 Panic disorder [episodic paroxysmal anxiety] without agoraphobia: Secondary | ICD-10-CM | POA: Insufficient documentation

## 2015-05-31 NOTE — Assessment & Plan Note (Signed)
Pt feels her stress will resolve with new job She refused counseling at this time  She refused any other meds for now rto prn

## 2015-06-04 ENCOUNTER — Encounter: Payer: Self-pay | Admitting: Family Medicine

## 2015-06-04 ENCOUNTER — Other Ambulatory Visit: Payer: Self-pay

## 2015-06-04 DIAGNOSIS — N760 Acute vaginitis: Secondary | ICD-10-CM

## 2015-06-04 MED ORDER — FLUCONAZOLE 150 MG PO TABS: ORAL_TABLET | ORAL | Status: DC

## 2015-06-04 NOTE — Telephone Encounter (Signed)
Patient was seen on 05/30/15. Please advise     KP

## 2015-06-04 NOTE — Telephone Encounter (Signed)
Diflucan 150 mg #2  1 po qd x1, may repeat in 3 days prn  Will need ov if it does not clear up

## 2015-06-19 ENCOUNTER — Encounter: Payer: Self-pay | Admitting: Family Medicine

## 2015-07-02 ENCOUNTER — Ambulatory Visit (INDEPENDENT_AMBULATORY_CARE_PROVIDER_SITE_OTHER): Payer: Self-pay | Admitting: Family Medicine

## 2015-07-02 ENCOUNTER — Encounter: Payer: Self-pay | Admitting: Family Medicine

## 2015-07-02 ENCOUNTER — Ambulatory Visit: Payer: Self-pay | Admitting: Family Medicine

## 2015-07-02 VITALS — BP 130/83 | HR 96 | Temp 98.3°F | Wt 215.8 lb

## 2015-07-02 DIAGNOSIS — G43909 Migraine, unspecified, not intractable, without status migrainosus: Secondary | ICD-10-CM

## 2015-07-02 MED ORDER — CYCLOBENZAPRINE HCL 10 MG PO TABS: 10.0000 mg | ORAL_TABLET | Freq: Three times a day (TID) | ORAL | Status: DC | PRN

## 2015-07-02 MED ORDER — TRAMADOL HCL 50 MG PO TABS: 50.0000 mg | ORAL_TABLET | Freq: Three times a day (TID) | ORAL | Status: DC | PRN

## 2015-07-02 NOTE — Patient Instructions (Signed)

## 2015-07-02 NOTE — Progress Notes (Signed)
Pre visit review using our clinic review tool, if applicable. No additional management support is needed unless otherwise documented below in the visit note. 
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
## 2015-07-03 ENCOUNTER — Other Ambulatory Visit (INDEPENDENT_AMBULATORY_CARE_PROVIDER_SITE_OTHER): Payer: Self-pay

## 2015-07-03 DIAGNOSIS — G43909 Migraine, unspecified, not intractable, without status migrainosus: Secondary | ICD-10-CM

## 2015-07-04 ENCOUNTER — Telehealth: Payer: Self-pay | Admitting: *Deleted

## 2015-07-04 DIAGNOSIS — D649 Anemia, unspecified: Secondary | ICD-10-CM

## 2015-07-04 LAB — CBC WITH DIFFERENTIAL/PLATELET
BASOS PCT: 2.5 % (ref 0.0–3.0)
Basophils Absolute: 0.2 10*3/uL — ABNORMAL HIGH (ref 0.0–0.1)
EOS ABS: 0.1 10*3/uL (ref 0.0–0.7)
EOS PCT: 0.8 % (ref 0.0–5.0)
HCT: 21.9 % — CL (ref 36.0–46.0)
LYMPHS ABS: 2.8 10*3/uL (ref 0.7–4.0)
Lymphocytes Relative: 28.7 % (ref 12.0–46.0)
MCHC: 29.6 g/dL — AB (ref 30.0–36.0)
MCV: 56.9 fl — ABNORMAL LOW (ref 78.0–100.0)
MONO ABS: 0.3 10*3/uL (ref 0.1–1.0)
Monocytes Relative: 3.2 % (ref 3.0–12.0)
NEUTROS ABS: 6.2 10*3/uL (ref 1.4–7.7)
NEUTROS PCT: 64.8 % (ref 43.0–77.0)
PLATELETS: 438 10*3/uL — AB (ref 150.0–400.0)
RBC: 3.84 Mil/uL — ABNORMAL LOW (ref 3.87–5.11)
RDW: 20.8 % — AB (ref 11.5–15.5)
WBC: 9.6 10*3/uL (ref 4.0–10.5)

## 2015-07-04 LAB — COMPREHENSIVE METABOLIC PANEL
ALBUMIN: 4.1 g/dL (ref 3.5–5.2)
ALT: 16 U/L (ref 0–35)
AST: 17 U/L (ref 0–37)
Alkaline Phosphatase: 75 U/L (ref 39–117)
BUN: 9 mg/dL (ref 6–23)
CHLORIDE: 104 meq/L (ref 96–112)
CO2: 25 mEq/L (ref 19–32)
CREATININE: 0.93 mg/dL (ref 0.40–1.20)
Calcium: 9.1 mg/dL (ref 8.4–10.5)
GFR: 78.38 mL/min (ref >60.00)
GLUCOSE: 93 mg/dL (ref 70–99)
POTASSIUM: 4.1 meq/L (ref 3.5–5.1)
SODIUM: 137 meq/L (ref 135–145)
Total Bilirubin: 0.3 mg/dL (ref 0.2–1.2)
Total Protein: 7.7 g/dL (ref 6.0–8.3)

## 2015-07-04 LAB — SEDIMENTATION RATE: SED RATE: 40 mm/h — AB (ref 0–22)

## 2015-07-04 MED ORDER — FERROUS SULFATE 325 (65 FE) MG PO TABS: 325.0000 mg | ORAL_TABLET | Freq: Two times a day (BID) | ORAL | Status: DC

## 2015-07-04 NOTE — Telephone Encounter (Signed)
agree

## 2015-07-04 NOTE — Telephone Encounter (Signed)
[facility] reporting critical . Hemoglobin @ 6.5             Hematocrit  @ 21.9

## 2015-07-04 NOTE — Telephone Encounter (Signed)
Not sure why this was sent to me. Will forward back to RN.

## 2015-07-04 NOTE — Telephone Encounter (Signed)
Pt notified of results and verbalized understanding. She questioned could these results be the cause of her headaches? Informed pt that low hgb often causes fatigue, which she endorsed, and could possibly contribute to HA. Informed her that since her hgb is so low, it may take some weeks before she is able to feel a difference after starting the iron supplements. She requested rx for iron for insurance purposes. Medication filled to pharmacy as requested. Referral placed to hematology.

## 2015-07-04 NOTE — Telephone Encounter (Signed)
LMOM for pt to return call for lab results and instructions.

## 2015-07-04 NOTE — Telephone Encounter (Signed)
She needs to take iron bid and refer to hematology

## 2015-07-06 ENCOUNTER — Ambulatory Visit (HOSPITAL_BASED_OUTPATIENT_CLINIC_OR_DEPARTMENT_OTHER): Payer: Self-pay

## 2015-07-18 ENCOUNTER — Other Ambulatory Visit: Payer: Self-pay | Admitting: Family

## 2015-07-18 DIAGNOSIS — D649 Anemia, unspecified: Secondary | ICD-10-CM

## 2015-07-18 DIAGNOSIS — D509 Iron deficiency anemia, unspecified: Secondary | ICD-10-CM

## 2015-07-19 ENCOUNTER — Ambulatory Visit (HOSPITAL_BASED_OUTPATIENT_CLINIC_OR_DEPARTMENT_OTHER): Payer: Self-pay

## 2015-07-19 ENCOUNTER — Ambulatory Visit: Payer: Self-pay

## 2015-07-19 ENCOUNTER — Other Ambulatory Visit: Payer: Self-pay

## 2015-07-19 ENCOUNTER — Ambulatory Visit (HOSPITAL_BASED_OUTPATIENT_CLINIC_OR_DEPARTMENT_OTHER): Payer: Self-pay | Admitting: Family

## 2015-07-19 ENCOUNTER — Other Ambulatory Visit (HOSPITAL_BASED_OUTPATIENT_CLINIC_OR_DEPARTMENT_OTHER): Payer: Self-pay

## 2015-07-19 ENCOUNTER — Encounter: Payer: Self-pay | Admitting: Family

## 2015-07-19 VITALS — BP 123/70 | HR 94 | Temp 98.5°F | Resp 18 | Ht 60.0 in | Wt 212.0 lb

## 2015-07-19 DIAGNOSIS — N921 Excessive and frequent menstruation with irregular cycle: Secondary | ICD-10-CM

## 2015-07-19 DIAGNOSIS — D509 Iron deficiency anemia, unspecified: Secondary | ICD-10-CM

## 2015-07-19 DIAGNOSIS — D649 Anemia, unspecified: Secondary | ICD-10-CM

## 2015-07-19 LAB — CBC WITH DIFFERENTIAL (CANCER CENTER ONLY)
BASO#: 0 10*3/uL (ref 0.0–0.2)
BASO%: 0.1 % (ref 0.0–2.0)
EOS%: 1.5 % (ref 0.0–7.0)
Eosinophils Absolute: 0.1 10*3/uL (ref 0.0–0.5)
HEMATOCRIT: 29.6 % — AB (ref 34.8–46.6)
HEMOGLOBIN: 8.6 g/dL — AB (ref 11.6–15.9)
LYMPH#: 2.2 10*3/uL (ref 0.9–3.3)
LYMPH%: 30.5 % (ref 14.0–48.0)
MCH: 20.3 pg — ABNORMAL LOW (ref 26.0–34.0)
MCHC: 29.1 g/dL — AB (ref 32.0–36.0)
MCV: 70 fL — AB (ref 81–101)
MONO#: 0.3 10*3/uL (ref 0.1–0.9)
MONO%: 3.7 % (ref 0.0–13.0)
NEUT#: 4.7 10*3/uL (ref 1.5–6.5)
NEUT%: 64.2 % (ref 39.6–80.0)
Platelets: 419 10*3/uL — ABNORMAL HIGH (ref 145–400)
RBC: 4.23 10*6/uL (ref 3.70–5.32)
WBC: 7.3 10*3/uL (ref 3.9–10.0)

## 2015-07-19 LAB — CHCC SATELLITE - SMEAR

## 2015-07-19 LAB — FERRITIN: Ferritin: 31 ng/ml (ref 9–269)

## 2015-07-19 LAB — IRON AND TIBC
%SAT: 11 % — AB (ref 21–57)
IRON: 39 ug/dL — AB (ref 41–142)
TIBC: 364 ug/dL (ref 236–444)
UIBC: 325 ug/dL (ref 120–384)

## 2015-07-19 LAB — RETICULOCYTES: RETICULOCYTE COUNT: 5.8 % — AB (ref 0.6–2.6)

## 2015-07-19 MED ORDER — SODIUM CHLORIDE 0.9 % IV SOLN
Freq: Once | INTRAVENOUS | Status: AC
  Administered 2015-07-19: 11:00:00 via INTRAVENOUS

## 2015-07-19 MED ORDER — SODIUM CHLORIDE 0.9 % IV SOLN
510.0000 mg | Freq: Once | INTRAVENOUS | Status: AC
  Administered 2015-07-19: 510 mg via INTRAVENOUS
  Filled 2015-07-19: qty 17

## 2015-07-19 MED ORDER — FOLIC ACID 1 MG PO TABS: 1.0000 mg | ORAL_TABLET | Freq: Every day | ORAL | Status: DC

## 2015-07-19 NOTE — Patient Instructions (Signed)

## 2015-07-19 NOTE — Progress Notes (Signed)
Hematology/Oncology Consultation   Name: Braleigh Massoud      MRN: [id number]    Location: Room/bed info not found  Date: 07/19/2015 Time:10:16 AM   REFERRING PHYSICIAN: Loreen Freud Chase, DO  REASON FOR CONSULT: Low Hgb 6.5   DIAGNOSIS: Iron deficiency anemia secondary to menorrhagia   HISTORY OF PRESENT ILLNESS: Ms. Cheryl Mullen is a very pleasant 25 yo African American female with history of anemia secondary to menorrhagia. She has irregular heavy cycles lasting 1 month at a time. Her Hgb today is 8.6 with an MCV of 70. Her platelet count is also slightly elevated at 419.  She is symptomatic at this time with fatigue and chewing lots of ice.  She was on birth control several years ago but decided to stop and is not currently on anything at this time. She plans to stop downstairs at Dr. Ernst Spell office on her way out to inquire about starting back on birth control.  No family history of anemia or cancer. Her mother has the sickle cell trait. No sickle cell disease.  No fever, chills, n/v, rash, dizziness, chest pain, palpitations, abdominal pain or changes in bowel or bladder habits.  She has a cough and congestion at this time due to allergies. She has SOB occasionally due to asthma.  No swelling, tenderness, numbness or tingling in her extremities. No c/o joint aches or pains.  She has maintained a good appetite and is staying well hydrated. She denies significant weight loss or gain.  She does not smoke and only drinks socially.  She recently started working for [employer] center and likes her job. Unfortunately she does not have insurance. We discussed the cost of iron infusions and Delice Bison will work on finding some funding to help with the cost. Her mother has applied to have the patient placed back on her insurance but this has not come through yet. They would like to still proceed with the infusion today.  She is not currently on folic acid.   ROS: All other 10 point review of systems is  negative.   PAST MEDICAL HISTORY:   Past Medical History  Diagnosis Date  . Anemia   . Allergy   . Eczema     ALLERGIES: No Known Allergies    MEDICATIONS:  Current Outpatient Prescriptions on File Prior to Visit  Medication Sig Dispense Refill  . ALPRAZolam (XANAX) 0.25 MG tablet Take 1 tablet (0.25 mg total) by mouth 2 (two) times daily as needed for anxiety. 20 tablet 0  . cetirizine (ZYRTEC) 10 MG tablet Take 1 tablet (10 mg total) by mouth daily. 30 tablet 11  . cyclobenzaprine (FLEXERIL) 10 MG tablet Take 1 tablet (10 mg total) by mouth 3 (three) times daily as needed for muscle spasms. 30 tablet 0  . ferrous sulfate 325 (65 FE) MG tablet Take 1 tablet (325 mg total) by mouth 2 (two) times daily with a meal. 60 tablet 2  . traMADol (ULTRAM) 50 MG tablet Take 1 tablet (50 mg total) by mouth every 8 (eight) hours as needed. 30 tablet 0  . triamcinolone ointment (KENALOG) 0.1 % Apply 1 application topically 2 (two) times daily. 30 g 0   No current facility-administered medications on file prior to visit.     PAST SURGICAL HISTORY Past Surgical History  Procedure Laterality Date  . Tonsillectomy and adenoidectomy Bilateral 01/02/2013    FAMILY HISTORY: Family History  Problem Relation Age of Onset  . Hypertension Mother   . Hyperlipidemia Mother   .  Hypertension Father   . Hyperlipidemia Father   . Hypertension Maternal Grandmother   . Hypertension Maternal Grandfather   . Diabetes Maternal Grandfather   . Hypertension Paternal Grandmother   . Hypertension Paternal Grandfather     SOCIAL HISTORY:  reports that she has never smoked. She does not have any smokeless tobacco history on file. She reports that she drinks alcohol. She reports that she does not use illicit drugs.  PERFORMANCE STATUS: The patient's performance status is 1 - Symptomatic but completely ambulatory  PHYSICAL EXAM: Most Recent Vital Signs: Blood pressure 123/70, pulse 94, temperature 98.5 F  (36.9 C), temperature source Oral, resp. rate 18, height 5' (1.524 m), weight 212 lb (96.163 kg). BP 123/70 mmHg  Pulse 94  Temp(Src) 98.5 F (36.9 C) (Oral)  Resp 18  Ht 5' (1.524 m)  Wt 212 lb (96.163 kg)  BMI 41.40 kg/m2  General Appearance:    Alert, cooperative, no distress, appears stated age  Head:    Normocephalic, without obvious abnormality, atraumatic  Eyes:    PERRL, conjunctiva/corneas clear, EOM's intact, fundi    benign, both eyes        Throat:   Lips, mucosa, and tongue normal; teeth and gums normal  Neck:   Supple, symmetrical, trachea midline, no adenopathy;    thyroid:  no enlargement/tenderness/nodules; no carotid   bruit or JVD  Back:     Symmetric, no curvature, ROM normal, no CVA tenderness  Lungs:     Clear to auscultation bilaterally, respirations unlabored  Chest Wall:    No tenderness or deformity   Heart:    Regular rate and rhythm, S1 and S2 normal, no murmur, rub   or gallop     Abdomen:     Soft, non-tender, bowel sounds active all four quadrants,    no masses, no organomegaly        Extremities:   Extremities normal, atraumatic, no cyanosis or edema  Pulses:   2+ and symmetric all extremities  Skin:   Skin color, texture, turgor normal, no rashes or lesions  Lymph nodes:   Cervical, supraclavicular, and axillary nodes normal  Neurologic:   CNII-XII intact, normal strength, sensation and reflexes    throughout    LABORATORY DATA:  Results for orders placed or performed in visit on 07/19/15 (from the past 48 hour(s))  CBC w/Diff     Status: Abnormal   Collection Time: 07/19/15  9:03 AM  Result Value Ref Range   WBC 7.3 3.9 - 10.0 10e3/uL   RBC 4.23 3.70 - 5.32 10e6/uL   HGB 8.6 (L) 11.6 - 15.9 g/dL   HCT 09.829.6 (L) 11.934.8 - 14.746.6 %   MCV 70 (L) 81 - 101 fL   MCH 20.3 (L) 26.0 - 34.0 pg   MCHC 29.1 (L) 32.0 - 36.0 g/dL   Platelets 829419 (H) 562145 - 400 10e3/uL   NEUT# 4.7 1.5 - 6.5 10e3/uL   LYMPH# 2.2 0.9 - 3.3 10e3/uL   MONO# 0.3 0.1 - 0.9  10e3/uL   Eosinophils Absolute 0.1 0.0 - 0.5 10e3/uL   BASO# 0.0 0.0 - 0.2 10e3/uL   NEUT% 64.2 39.6 - 80.0 %   LYMPH% 30.5 14.0 - 48.0 %   MONO% 3.7 0.0 - 13.0 %   EOS% 1.5 0.0 - 7.0 %   BASO% 0.1 0.0 - 2.0 %  Smear     Status: None   Collection Time: 07/19/15  9:03 AM  Result Value Ref Range  Smear Result Smear Available       RADIOGRAPHY: No results found.     PATHOLOGY: None  ASSESSMENT/PLAN: Ms. Cheryl Mullen is a very pleasant 25 yo [place] American female with history of anemia secondary to menorrhagia. Her Hgb today is 8.6 with an MCV of 70. Her platelet count is also slightly elevated at 419. She is symptomatic with fatigue and chewing ice.  After discussing the cost with the patient and her mother, they would like to proceed with an infusion today. We will see what the rest of her lab work shows and may bring her back in for a second dose of Feraheme in 8 days.  She plan to contact Dr. Laury Axon and discuss birth control options that will help regulate and slow her cycle.  We will plan to see her back in 6 weeks for repeat lab work and follow-up.  All questions were answered. Both she and her mother know to contact our office with any questions or concerns. We can certainly see her sooner if necessary.  She was discussed with and also seen by Dr. Myna Hidalgo and he is in agreement with the aforementioned.   [facility]     Addendum:  I saw and examined patient with Ayen Viviano. Religion the blood smear. She does have microcytic red blood cells. She has hypochromic red blood cells. There are also a few target cells.  She clearly has iron deficiency anemia. She has menometrorrhagia. There may be some element of poor iron absorption.  Her iron studies show a ferritin of 31 with an iron saturation of 11%.  We will go ahead and give her some iron. She probably needs more than 1 dose.  I don't see that we have to do any x-ray studies. She definitely does not need a bone marrow  test.  We'll plan to get her back in another 6 weeks.  We spent about 45 minutes with her this morning.  Pete e.

## 2015-07-19 NOTE — Progress Notes (Signed)
Per Maralyn SagoSarah, NP - proceed with treatment although pt is uninsured. Charges & financial assistance have been discussed with pt and mother. dph

## 2015-07-22 ENCOUNTER — Other Ambulatory Visit: Payer: Self-pay | Admitting: Family

## 2015-07-22 ENCOUNTER — Encounter: Payer: Self-pay | Admitting: Family Medicine

## 2015-07-22 DIAGNOSIS — N921 Excessive and frequent menstruation with irregular cycle: Secondary | ICD-10-CM

## 2015-07-22 DIAGNOSIS — D509 Iron deficiency anemia, unspecified: Secondary | ICD-10-CM

## 2015-07-22 LAB — HEMOGLOBINOPATHY EVALUATION
HGB A: 62.9 % — AB (ref 94.0–98.0)
HGB C: 0 %
HGB S: 33.8 % — AB
Hemoglobin A2 Quantitation: 3.3 % — ABNORMAL HIGH (ref 0.7–3.1)
Hemoglobin F Quantitation: 0 % (ref 0.0–2.0)

## 2015-07-22 MED ORDER — FERUMOXYTOL INJECTION 510 MG/17 ML: 510.0000 mg | INTRAVENOUS | Status: DC | PRN

## 2015-07-22 MED ORDER — NORGESTIM-ETH ESTRAD TRIPHASIC 0.18/0.215/0.25 MG-35 MCG PO TABS
ORAL_TABLET | ORAL | Status: DC
Start: 2015-07-22 — End: 2016-05-29

## 2015-07-22 NOTE — Telephone Encounter (Signed)
trisprintec 1 po qd (same time every day)  #1  5 refills ---- recheck 3 months

## 2015-07-24 ENCOUNTER — Telehealth: Payer: Self-pay | Admitting: Family

## 2015-07-24 LAB — ALPHA-THALASSEMIA GENOTYPR: PDF: 0

## 2015-07-24 NOTE — Telephone Encounter (Signed)
I spoke with the patient's mother and let her know Cheryl Mullen is positive for the Sickle cell trait. She is already on folic acid 1 mg PO daily. We will plan to see her back at her next infusion appointment Friday. All questions were answered.

## 2015-07-26 ENCOUNTER — Ambulatory Visit: Payer: Self-pay

## 2015-07-26 ENCOUNTER — Ambulatory Visit (HOSPITAL_BASED_OUTPATIENT_CLINIC_OR_DEPARTMENT_OTHER): Payer: Self-pay

## 2015-07-26 VITALS — BP 135/75 | HR 88 | Temp 98.2°F | Resp 20

## 2015-07-26 DIAGNOSIS — N921 Excessive and frequent menstruation with irregular cycle: Secondary | ICD-10-CM

## 2015-07-26 DIAGNOSIS — D509 Iron deficiency anemia, unspecified: Secondary | ICD-10-CM

## 2015-07-26 MED ORDER — SODIUM CHLORIDE 0.9 % IV SOLN
Freq: Once | INTRAVENOUS | Status: AC
  Administered 2015-07-26: 08:00:00 via INTRAVENOUS

## 2015-07-26 MED ORDER — SODIUM CHLORIDE 0.9 % IV SOLN
510.0000 mg | Freq: Once | INTRAVENOUS | Status: AC
  Administered 2015-07-26: 510 mg via INTRAVENOUS
  Filled 2015-07-26: qty 17

## 2015-07-26 NOTE — Patient Instructions (Signed)

## 2015-09-06 ENCOUNTER — Other Ambulatory Visit: Payer: Self-pay

## 2015-09-06 ENCOUNTER — Encounter: Payer: Self-pay | Admitting: Family

## 2015-09-06 ENCOUNTER — Ambulatory Visit (HOSPITAL_BASED_OUTPATIENT_CLINIC_OR_DEPARTMENT_OTHER): Payer: Self-pay | Admitting: Family

## 2015-09-06 ENCOUNTER — Ambulatory Visit: Payer: Self-pay | Admitting: Family

## 2015-09-06 ENCOUNTER — Other Ambulatory Visit: Payer: Self-pay | Admitting: Family

## 2015-09-06 ENCOUNTER — Other Ambulatory Visit (HOSPITAL_BASED_OUTPATIENT_CLINIC_OR_DEPARTMENT_OTHER): Payer: Self-pay

## 2015-09-06 VITALS — BP 128/82 | HR 89 | Temp 98.5°F | Resp 16 | Ht 60.0 in | Wt 216.0 lb

## 2015-09-06 DIAGNOSIS — D500 Iron deficiency anemia secondary to blood loss (chronic): Secondary | ICD-10-CM

## 2015-09-06 DIAGNOSIS — D509 Iron deficiency anemia, unspecified: Secondary | ICD-10-CM

## 2015-09-06 DIAGNOSIS — N921 Excessive and frequent menstruation with irregular cycle: Secondary | ICD-10-CM

## 2015-09-06 LAB — CBC WITH DIFFERENTIAL (CANCER CENTER ONLY)
BASO#: 0 10*3/uL (ref 0.0–0.2)
BASO%: 0.2 % (ref 0.0–2.0)
EOS ABS: 0.1 10*3/uL (ref 0.0–0.5)
EOS%: 1.8 % (ref 0.0–7.0)
HEMATOCRIT: 36.3 % (ref 34.8–46.6)
HGB: 12.3 g/dL (ref 11.6–15.9)
LYMPH#: 2.3 10*3/uL (ref 0.9–3.3)
LYMPH%: 37.7 % (ref 14.0–48.0)
MCH: 28.1 pg (ref 26.0–34.0)
MCHC: 33.9 g/dL (ref 32.0–36.0)
MCV: 83 fL (ref 81–101)
MONO#: 0.5 10*3/uL (ref 0.1–0.9)
MONO%: 8.5 % (ref 0.0–13.0)
NEUT#: 3.1 10*3/uL (ref 1.5–6.5)
NEUT%: 51.8 % (ref 39.6–80.0)
PLATELETS: 255 10*3/uL (ref 145–400)
RBC: 4.37 10*6/uL (ref 3.70–5.32)
RDW: 19.5 % — ABNORMAL HIGH (ref 11.1–15.7)
WBC: 6 10*3/uL (ref 3.9–10.0)

## 2015-09-06 LAB — FERRITIN: Ferritin: 154 ng/ml (ref 9–269)

## 2015-09-06 LAB — IRON AND TIBC
%SAT: 17 % — ABNORMAL LOW (ref 21–57)
Iron: 41 ug/dL (ref 41–142)
TIBC: 248 ug/dL (ref 236–444)
UIBC: 207 ug/dL (ref 120–384)

## 2015-09-06 NOTE — Progress Notes (Signed)
Hematology and Oncology Follow Up Visit  Kiira Brach [id number] Aug 25, 1990 25 y.o. 09/06/2015   Principle Diagnosis:  Iron deficiency anemia secondary to menorrhagia  Current Therapy:   IV iron as indicated     Interim History:  Ms. Cheryl Mullen is here today with her boyfriend for a follow-up. She received 2 doses of Feraheme in April and has had a nice response. Her fatigue and ice cravings have resolved. She is now on birth control and her cycles have lightened and are shorter now lasting 4 days at a time once a month. Her Hgb is now up to 12.3 with an MCV of 83.  No fever, chills, n/v, cough, rash, dizziness, SOB, chest pain, palpitations, abdominal pain or changes in bowel or bladder habits.  No swelling, tenderness, numbness or tingling in her extremities. No c/o joint aches or bone pain.  Her appetite comes and goes. She is making sure to stay well hydrated. Her weight is stable.    Medications:    Medication List       This list is accurate as of: 09/06/15  8:49 AM.  Always use your most recent med list.               ALPRAZolam 0.25 MG tablet  Commonly known as:  XANAX  Take 1 tablet (0.25 mg total) by mouth 2 (two) times daily as needed for anxiety.     cetirizine 10 MG tablet  Commonly known as:  ZYRTEC  Take 1 tablet (10 mg total) by mouth daily.     cyclobenzaprine 10 MG tablet  Commonly known as:  FLEXERIL  Take 1 tablet (10 mg total) by mouth 3 (three) times daily as needed for muscle spasms.     ferrous sulfate 325 (65 FE) MG tablet  Take 1 tablet (325 mg total) by mouth 2 (two) times daily with a meal.     ferumoxytol 510 MG/17ML Soln injection  Commonly known as:  FERAHEME  Inject 17 mLs (510 mg total) into the vein as needed.     folic acid 1 MG tablet  Commonly known as:  FOLVITE  Take 1 tablet (1 mg total) by mouth daily.     Norgestimate-Ethinyl Estradiol Triphasic 0.18/0.215/0.25 MG-35 MCG tablet  Commonly known as:  TRI-SPRINTEC  Take 1  tablet by mouth at the same time every day.     traMADol 50 MG tablet  Commonly known as:  ULTRAM  Take 1 tablet (50 mg total) by mouth every 8 (eight) hours as needed.     triamcinolone ointment 0.1 %  Commonly known as:  KENALOG  Apply 1 application topically 2 (two) times daily.        Allergies: No Known Allergies  Past Medical History, Surgical history, Social history, and Family History were reviewed and updated.  Review of Systems: All other 10 point review of systems is negative.   Physical Exam:  height is 5' (1.524 m) and weight is 216 lb (97.977 kg). Her oral temperature is 98.5 F (36.9 C). Her blood pressure is 128/82 and her pulse is 89. Her respiration is 16.   Wt Readings from Last 3 Encounters:  09/06/15 216 lb (97.977 kg)  07/19/15 212 lb (96.163 kg)  07/02/15 215 lb 12.8 oz (97.886 kg)    Ocular: Sclerae unicteric, pupils equal, round and reactive to light Ear-nose-throat: Oropharynx clear, dentition fair Lymphatic: No cervical supraclavicular or axillary adenopathy Lungs no rales or rhonchi, good excursion bilaterally Heart regular rate and rhythm,  no murmur appreciated Abd soft, nontender, positive bowel sounds, no liver or spleen tip palpated on exam MSK no focal spinal tenderness, no joint edema Neuro: non-focal, well-oriented, appropriate affect Breasts: Deferred  Lab Results  Component Value Date   WBC 6.0 09/06/2015   HGB 12.3 09/06/2015   HCT 36.3 09/06/2015   MCV 83 09/06/2015   PLT 255 09/06/2015   Lab Results  Component Value Date   FERRITIN 31 07/19/2015   IRON 39* 07/19/2015   TIBC 364 07/19/2015   UIBC 325 07/19/2015   IRONPCTSAT 11* 07/19/2015   Lab Results  Component Value Date   RBC 4.37 09/06/2015   No results found for: KPAFRELGTCHN, LAMBDASER, KAPLAMBRATIO No results found for: IGGSERUM, IGA, IGMSERUM No results found for: Marda StalkerOTALPROTELP, ALBUMINELP, A1GS, A2GS, BETS, BETA2SER, GAMS, MSPIKE, SPEI   Chemistry        Component Value Date/Time   NA 137 07/03/2015 1647   K 4.1 07/03/2015 1647   CL 104 07/03/2015 1647   CO2 25 07/03/2015 1647   BUN 9 07/03/2015 1647   CREATININE 0.93 07/03/2015 1647      Component Value Date/Time   CALCIUM 9.1 07/03/2015 1647   ALKPHOS 75 07/03/2015 1647   AST 17 07/03/2015 1647   ALT 16 07/03/2015 1647   BILITOT 0.3 07/03/2015 1647     Impression and Plan: Ms. Cheryl LarssonMalachi is a very pleasant 25 yo African American female with history of anemia secondary to menorrhagia. [facility] and is asymptomatic at this time.  Her CBC today is much improved. We will see what her iron studies show and bring her back in next week for an infusion if needed.  We will plan to see her back in 3 months for repeat labs and follow-up.  She will contact us with any questions or concerns. We can certainly see her sooner if need be.   Verdie MosherINCINNATI,SARAH M, NP 6/2/20178:49 AM

## 2015-09-10 ENCOUNTER — Ambulatory Visit: Payer: Self-pay

## 2015-09-11 ENCOUNTER — Ambulatory Visit (HOSPITAL_BASED_OUTPATIENT_CLINIC_OR_DEPARTMENT_OTHER): Payer: Self-pay

## 2015-09-11 VITALS — BP 127/86 | HR 84 | Temp 97.9°F | Resp 16

## 2015-09-11 DIAGNOSIS — D509 Iron deficiency anemia, unspecified: Secondary | ICD-10-CM

## 2015-09-11 DIAGNOSIS — N921 Excessive and frequent menstruation with irregular cycle: Secondary | ICD-10-CM

## 2015-09-11 MED ORDER — SODIUM CHLORIDE 0.9 % IV SOLN
510.0000 mg | Freq: Once | INTRAVENOUS | Status: AC
  Administered 2015-09-11: 510 mg via INTRAVENOUS
  Filled 2015-09-11: qty 17

## 2015-09-11 MED ORDER — SODIUM CHLORIDE 0.9 % IV SOLN
Freq: Once | INTRAVENOUS | Status: AC
  Administered 2015-09-11: 08:00:00 via INTRAVENOUS

## 2015-09-11 NOTE — Patient Instructions (Signed)

## 2015-10-04 ENCOUNTER — Encounter: Payer: Self-pay | Admitting: Family Medicine

## 2015-10-04 MED ORDER — FERROUS SULFATE 325 (65 FE) MG PO TABS: 325.0000 mg | ORAL_TABLET | Freq: Two times a day (BID) | ORAL | Status: DC

## 2015-12-12 ENCOUNTER — Ambulatory Visit: Payer: Self-pay | Admitting: Hematology & Oncology

## 2015-12-12 ENCOUNTER — Other Ambulatory Visit: Payer: Self-pay

## 2015-12-14 ENCOUNTER — Encounter: Payer: Self-pay | Admitting: Family Medicine

## 2015-12-16 ENCOUNTER — Other Ambulatory Visit (HOSPITAL_COMMUNITY)
Admission: RE | Admit: 2015-12-16 | Discharge: 2015-12-16 | Disposition: A | Discharge status: Home / Self Care | Payer: Self-pay | Source: Ambulatory Visit | Attending: Family | Admitting: Family

## 2015-12-16 ENCOUNTER — Encounter: Payer: Self-pay | Admitting: Family

## 2015-12-16 ENCOUNTER — Encounter: Payer: Self-pay | Admitting: Hematology & Oncology

## 2015-12-16 ENCOUNTER — Other Ambulatory Visit (HOSPITAL_BASED_OUTPATIENT_CLINIC_OR_DEPARTMENT_OTHER): Payer: Self-pay

## 2015-12-16 ENCOUNTER — Ambulatory Visit: Payer: Self-pay | Admitting: Family Medicine

## 2015-12-16 ENCOUNTER — Ambulatory Visit (HOSPITAL_BASED_OUTPATIENT_CLINIC_OR_DEPARTMENT_OTHER): Payer: Self-pay | Admitting: Hematology & Oncology

## 2015-12-16 ENCOUNTER — Encounter: Payer: Self-pay | Admitting: Family Medicine

## 2015-12-16 ENCOUNTER — Ambulatory Visit (INDEPENDENT_AMBULATORY_CARE_PROVIDER_SITE_OTHER): Payer: Self-pay | Admitting: Family

## 2015-12-16 VITALS — BP 130/86 | HR 89 | Temp 98.0°F | Resp 16 | Ht 60.0 in | Wt 209.0 lb

## 2015-12-16 VITALS — BP 114/90 | HR 88 | Temp 98.5°F | Resp 18 | Ht 60.0 in | Wt 210.0 lb

## 2015-12-16 DIAGNOSIS — D509 Iron deficiency anemia, unspecified: Secondary | ICD-10-CM

## 2015-12-16 DIAGNOSIS — N921 Excessive and frequent menstruation with irregular cycle: Secondary | ICD-10-CM

## 2015-12-16 DIAGNOSIS — Z23 Encounter for immunization: Secondary | ICD-10-CM

## 2015-12-16 DIAGNOSIS — A549 Gonococcal infection, unspecified: Secondary | ICD-10-CM

## 2015-12-16 DIAGNOSIS — Z113 Encounter for screening for infections with a predominantly sexual mode of transmission: Secondary | ICD-10-CM | POA: Insufficient documentation

## 2015-12-16 DIAGNOSIS — N760 Acute vaginitis: Secondary | ICD-10-CM | POA: Insufficient documentation

## 2015-12-16 DIAGNOSIS — D500 Iron deficiency anemia secondary to blood loss (chronic): Secondary | ICD-10-CM

## 2015-12-16 LAB — CBC WITH DIFFERENTIAL (CANCER CENTER ONLY)
BASO#: 0 10*3/uL (ref 0.0–0.2)
BASO%: 0.2 % (ref 0.0–2.0)
EOS ABS: 0.2 10*3/uL (ref 0.0–0.5)
EOS%: 2.6 % (ref 0.0–7.0)
HCT: 37.9 % (ref 34.8–46.6)
HGB: 13.7 g/dL (ref 11.6–15.9)
LYMPH#: 2.3 10*3/uL (ref 0.9–3.3)
LYMPH%: 37.9 % (ref 14.0–48.0)
MCH: 31.3 pg (ref 26.0–34.0)
MCHC: 36.1 g/dL — AB (ref 32.0–36.0)
MCV: 87 fL (ref 81–101)
MONO#: 0.5 10*3/uL (ref 0.1–0.9)
MONO%: 7.6 % (ref 0.0–13.0)
NEUT#: 3.1 10*3/uL (ref 1.5–6.5)
NEUT%: 51.7 % (ref 39.6–80.0)
PLATELETS: 216 10*3/uL (ref 145–400)
RBC: 4.38 10*6/uL (ref 3.70–5.32)
RDW: 12.3 % (ref 11.1–15.7)
WBC: 6.1 10*3/uL (ref 3.9–10.0)

## 2015-12-16 LAB — IRON AND TIBC
%SAT: 29 % (ref 21–57)
IRON: 69 ug/dL (ref 41–142)
TIBC: 235 ug/dL — AB (ref 236–444)
UIBC: 166 ug/dL (ref 120–384)

## 2015-12-16 LAB — FERRITIN: Ferritin: 282 ng/ml — ABNORMAL HIGH (ref 9–269)

## 2015-12-16 MED ORDER — CEFTRIAXONE SODIUM 250 MG IJ SOLR
250.0000 mg | Freq: Once | INTRAMUSCULAR | Status: AC
  Administered 2015-12-16: 250 mg via INTRAMUSCULAR

## 2015-12-16 MED ORDER — AZITHROMYCIN 250 MG PO TABS
1000.0000 mg | ORAL_TABLET | Freq: Every day | ORAL | Status: DC
  Administered 2015-12-16: 1000 mg via ORAL

## 2015-12-16 NOTE — Progress Notes (Signed)
Hematology and Oncology Follow Up Visit  Cheryl Mullen [id number] 04-19-1990 24 y.o. 12/16/2015   Principle Diagnosis:   Iron deficiency anemia secondary to menometrorrhagia  Current Therapy:    IV iron as indicated - last dose given June 2017     Interim History:  Cheryl Mullen is back for follow-up. She is doing quite well. She still has the menorrhagia but this appears to be a little bit better. Her cycles are only 4-5 days.  She got iron back in June. This is made to feel a whole lot better. She is not chewing ice. She has more energy. She is able to work. She's had no nausea or vomiting. There's been no bleeding otherwise.  Back in June when she got iron, her iron saturation was only 17%.  She's had no cough or shortness of breath.  Cheryl Mullen is next week. She will be getting another tattoo.  Overall, her performance status is ECOG 0.    Medications:  Current Outpatient Prescriptions:  .  ALPRAZolam (XANAX) 0.25 MG tablet, Take 1 tablet (0.25 mg total) by mouth 2 (two) times daily as needed for anxiety., Disp: 20 tablet, Rfl: 0 .  cetirizine (ZYRTEC) 10 MG tablet, Take 1 tablet (10 mg total) by mouth daily., Disp: 30 tablet, Rfl: 11 .  ferrous sulfate 325 (65 FE) MG tablet, Take 1 tablet (325 mg total) by mouth 2 (two) times daily with a meal., Disp: 60 tablet, Rfl: 5 .  ferumoxytol (FERAHEME) 510 MG/17ML SOLN injection, Inject 17 mLs (510 mg total) into the vein as needed., Disp: 15.08 mL, Rfl: 10 .  folic acid (FOLVITE) 1 MG tablet, Take 1 tablet (1 mg total) by mouth daily., Disp: 30 tablet, Rfl: 3 .  Norgestimate-Ethinyl Estradiol Triphasic (TRI-SPRINTEC) 0.18/0.215/0.25 MG-35 MCG tablet, Take 1 tablet by mouth at the same time every day., Disp: 1 Package, Rfl: 5 .  traMADol (ULTRAM) 50 MG tablet, Take 1 tablet (50 mg total) by mouth every 8 (eight) hours as needed., Disp: 30 tablet, Rfl: 0 .  triamcinolone ointment (KENALOG) 0.1 %, Apply 1 application topically 2 (two)  times daily., Disp: 30 g, Rfl: 0  Allergies: No Known Allergies  Past Medical History, Surgical history, Social history, and Family History were reviewed and updated.  Review of Systems: As above  Physical Exam:  height is 5' (1.524 m) and weight is 209 lb (94.8 kg). Her oral temperature is 98 F (36.7 C). Her blood pressure is 130/86 and her pulse is 89. Her respiration is 16.   Wt Readings from Last 3 Encounters:  12/16/15 209 lb (94.8 kg)  09/06/15 216 lb (98 kg)  07/19/15 212 lb (96.2 kg)     Well-developed and well-nourished after married female in no obvious distress. Head and neck exam shows no ocular or oral lesions. There are no palpable cervical or supraclavicular lymph nodes. Lungs are clear. Cardiac exam regular in rhythm with no murmurs, rubs or bruits. Abdomen is soft. Shows good bowel sounds. There is no fluid wave. There is no palpable liver or spleen tip. Back exam shows no tenderness over the spine, ribs or hips. Extremities shows no clubbing, cyanosis or edema. Skin exam shows no rashes, ecchymoses or petechia.  Lab Results  Component Value Date   WBC 6.1 12/16/2015   HGB 13.7 12/16/2015   HCT 37.9 12/16/2015   MCV 87 12/16/2015   PLT 216 12/16/2015     Chemistry      Component Value Date/Time   NA  137 07/03/2015 1647   K 4.1 07/03/2015 1647   CL 104 07/03/2015 1647   CO2 25 07/03/2015 1647   BUN 9 07/03/2015 1647   CREATININE 0.93 07/03/2015 1647      Component Value Date/Time   CALCIUM 9.1 07/03/2015 1647   ALKPHOS 75 07/03/2015 1647   AST 17 07/03/2015 1647   ALT 16 07/03/2015 1647   BILITOT 0.3 07/03/2015 1647         Impression and Plan: Cheryl Mullen is A 25 year old after married female. She menometrorrhagia. She is doing better with this.  We will have see what her iron studies show. I think everything is okay, and we might be noted get her back in another 4 or 5 months. I think this would be reasonable.  Again, I would be surprised if  there was any iron deficiency at this point.   Josph MachoENNEVER,Elford Evilsizer R, MD 9/11/20179:02 AM

## 2015-12-16 NOTE — Progress Notes (Signed)
Subjective:    Patient ID: Cheryl Mullen, female    DOB: 09-04-1990, 25 y.o.   MRN: [id number]  HPI  Cheryl Mullen is a 25 yr old female who presents today with c/o vaginal discharge.  Reports that her female sex partner was told that he has gonorrhea. He received treatment 2 days ago.  Notes + vaginal odor and vaginal itching.    Review of Systems See HPI  Past Medical History:  Diagnosis Date  . Allergy   . Anemia   . Eczema      Social History   Social History  . Marital status: Single    Spouse name: N/A  . Number of children: N/A  . Years of education: N/A   Occupational History  . [employer] group leader    Social History Main Topics  . Smoking status: Never Smoker  . Smokeless tobacco: Never Used  . Alcohol use 0.0 oz/week  . Drug use: No  . Sexual activity: Not Currently    Partners: Female    Birth control/ protection: Injection   Other Topics Concern  . Not on file   Social History Narrative   Exercise--- NO    Past Surgical History:  Procedure Laterality Date  . TONSILLECTOMY AND ADENOIDECTOMY Bilateral 01/02/2013    Family History  Problem Relation Age of Onset  . Hypertension Mother   . Hyperlipidemia Mother   . Hypertension Father   . Hyperlipidemia Father   . Hypertension Maternal Grandmother   . Hypertension Maternal Grandfather   . Diabetes Maternal Grandfather   . Hypertension Paternal Grandmother   . Hypertension Paternal Grandfather     No Known Allergies  Current Outpatient Prescriptions on File Prior to Visit  Medication Sig Dispense Refill  . ALPRAZolam (XANAX) 0.25 MG tablet Take 1 tablet (0.25 mg total) by mouth 2 (two) times daily as needed for anxiety. 20 tablet 0  . cetirizine (ZYRTEC) 10 MG tablet Take 1 tablet (10 mg total) by mouth daily. 30 tablet 11  . ferrous sulfate 325 (65 FE) MG tablet Take 1 tablet (325 mg total) by mouth 2 (two) times daily with a meal. 60 tablet 5  . ferumoxytol (FERAHEME) 510 MG/17ML SOLN  injection Inject 17 mLs (510 mg total) into the vein as needed. 15.08 mL 10  . folic acid (FOLVITE) 1 MG tablet Take 1 tablet (1 mg total) by mouth daily. 30 tablet 3  . Norgestimate-Ethinyl Estradiol Triphasic (TRI-SPRINTEC) 0.18/0.215/0.25 MG-35 MCG tablet Take 1 tablet by mouth at the same time every day. 1 Package 5  . traMADol (ULTRAM) 50 MG tablet Take 1 tablet (50 mg total) by mouth every 8 (eight) hours as needed. 30 tablet 0  . triamcinolone ointment (KENALOG) 0.1 % Apply 1 application topically 2 (two) times daily. 30 g 0   No current facility-administered medications on file prior to visit.     BP 114/90 (BP Location: Right Arm, Cuff Size: Large)   Pulse 88   Temp 98.5 F (36.9 C) (Oral)   Resp 18   Ht 5' (1.524 m)   Wt 210 lb (95.3 kg)   LMP 12/04/2015   SpO2 100% Comment: room air  BMI 41.01 kg/m       Objective:   Physical Exam  Constitutional: She is oriented to person, place, and time. She appears well-developed and well-nourished.  Cardiovascular: Normal rate, regular rhythm and normal heart sounds.   No murmur heard. Pulmonary/Chest: Effort normal and breath sounds normal. No respiratory distress.  She has no wheezes.  Genitourinary: Vaginal discharge found.  Musculoskeletal: She exhibits no edema.  Neurological: She is alert and oriented to person, place, and time.  Psychiatric: She has a normal mood and affect. Her behavior is normal. Judgment and thought content normal.          Assessment & Plan:  Gonorrhea- + vaginitis, known exposure to gonorrhea. Will send swabs for gc/chlamydia as well as a wet prep to check for yeast/bacteria, trichomonas. Pt to receive 250mg  of ceftriaxone IM + 1gm of azithromycin today in the clinic.  She is instructed on importance of condom use. She declines HIV screen today. She is due for Tdap as well.

## 2015-12-16 NOTE — Progress Notes (Signed)
Pre visit review using our clinic review tool, if applicable. No additional management support is needed unless otherwise documented below in the visit note. 

## 2015-12-16 NOTE — Patient Instructions (Signed)
Gonorrhea Gonorrhea is an infection that can cause serious problems. If left untreated, the infection may:   Damage the female or female organs.   Cause women to be unable to have children (sterility).   Harm a fetus if the infected woman is pregnant.  It is important to get treatment for gonorrhea as soon as possible. It is also necessary that all your sexual partners be tested for the infection.  CAUSES  Gonorrhea is caused by bacteria called Neisseria gonorrhoeae. The infection is spread from person to person, usually by sexual contact (such as by anal, vaginal, or oral means). A newborn can contract the infection from his or her mother during birth.  RISK FACTORS  Being a woman younger than 25 years of age who is sexually active.  Being a woman 25 years of age or older who has:  A new sex partner.  More than one sex partner.  A sex partner who has a sexually transmitted disease (STD).  Using condoms inconsistently.  Currently having, or having previously had, an STD.  Exchanging sex or money or drugs. SYMPTOMS  Some people with gonorrhea do not have symptoms. Symptoms may be different in females and males.  Females The most common symptoms are:   Pain in the lower abdomen.   Fever with or without chills.  Other symptoms include:   Abnormal vaginal discharge.   Painful intercourse.   Burning or itching of the vagina or lips of the vagina.   Abnormal vaginal bleeding.   Pain when urinating.   Long-lasting (chronic) pain in the lower abdomen, especially during menstruation or intercourse.   Inability to become pregnant.   Going into premature labor.   Irritation, pain, bleeding, or discharge from the rectum. This may occur if the infection was spread by anal sex.   Sore throat or swollen lymph nodes in the neck. This may occur if the infection was spread by oral sex.  Males The most common symptoms are:   Discharge from the penis.   Pain  or burning during urination.   Pain or swelling in the testicles. Other symptoms may include:   Irritation, pain, bleeding, or discharge from the rectum. This may occur if the infection was spread by anal sex.   Sore throat, fever, or swollen lymph nodes in the neck. This may occur if the infection was spread by oral sex.  DIAGNOSIS  A diagnosis is made after a physical exam is done and a sample of discharge is examined under a microscope for the presence of the bacteria. The discharge may be taken from the urethra, cervix, throat, or rectum.  TREATMENT  Gonorrhea is treated with antibiotic medicines. It is important for treatment to begin as soon as possible. Early treatment may prevent some problems from developing. Do not have sex. Avoid all types of sexual activity for 7 days after treatment is complete and until any sex partners have been treated. HOME CARE INSTRUCTIONS   Take medicines only as directed by your health care provider.   Take your antibiotic medicine as directed by your health care provider. Finish the antibiotic even if you start to feel better. Incomplete treatment will put you at risk for continued infection.   Do not have sex until treatment is complete or as directed by your health care provider.   Keep all follow-up visits as directed by your health care provider.   Not all test results are available during your visit. If your test results are not back   during the visit, make an appointment with your health care provider to find out the results. Do not assume everything is normal if you have not heard from your health care provider or the medical facility. It is your responsibility to get your test results.  If you test positive for gonorrhea, inform your recent sexual partners. They need to be checked for gonorrhea even if they do not have symptoms. They may need treatment, even if they test negative for gonorrhea.  SEEK MEDICAL CARE IF:   You develop any  bad reaction to the medicine you were prescribed. This may include:   A rash.   Nausea.   Vomiting.   Diarrhea.   Your symptoms do not improve after a few days of taking antibiotics.   Your symptoms get worse.   You develop increased pain, such as in the testicles (for males) or in the abdomen (for females).  You have a fever. MAKE SURE YOU:   Understand these instructions.  Will watch your condition.  Will get help right away if you are not doing well or get worse.   This information is not intended to replace advice given to you by your health care provider. Make sure you discuss any questions you have with your health care provider.   Document Released: 03/20/2000 Document Revised: 04/13/2014 Document Reviewed: 09/28/2012 Elsevier Interactive Patient Education 2016 Elsevier Inc.  

## 2015-12-17 ENCOUNTER — Encounter: Payer: Self-pay | Admitting: *Deleted

## 2015-12-17 LAB — CERVICOVAGINAL ANCILLARY ONLY
CHLAMYDIA, DNA PROBE: NEGATIVE
Neisseria Gonorrhea: POSITIVE — AB
Wet Prep (BD Affirm): POSITIVE — AB

## 2015-12-18 ENCOUNTER — Telehealth: Payer: Self-pay | Admitting: Family

## 2015-12-18 MED ORDER — METRONIDAZOLE 0.75 % VA GEL: 1.0000 | Freq: Every day | VAGINAL | 0 refills | Status: AC

## 2015-12-18 NOTE — Telephone Encounter (Signed)
Notified pt and she voices understanding. 

## 2015-12-18 NOTE — Telephone Encounter (Signed)
Please let pt know that her testing did show + for gonorrhea. (she received ceftriaxone and zithromax during her visit, correct?). She told me that her partner was already treated.  Also showed bacterial vaginosis. I will send rx for metrogel.

## 2015-12-20 NOTE — Telephone Encounter (Signed)
[facility] at 4:50[facility] on 12/18/15.

## 2016-01-05 ENCOUNTER — Emergency Department (HOSPITAL_BASED_OUTPATIENT_CLINIC_OR_DEPARTMENT_OTHER)
Admission: EM | Admit: 2016-01-05 | Discharge: 2016-01-05 | Disposition: A | Discharge status: Home / Self Care | Payer: Self-pay | Attending: Emergency Medicine | Admitting: Emergency Medicine

## 2016-01-05 ENCOUNTER — Encounter: Payer: Self-pay | Admitting: Emergency Medicine

## 2016-01-05 DIAGNOSIS — S39012A Strain of muscle, fascia and tendon of lower back, initial encounter: Secondary | ICD-10-CM | POA: Insufficient documentation

## 2016-01-05 DIAGNOSIS — Y92213 High school as the place of occurrence of the external cause: Secondary | ICD-10-CM | POA: Insufficient documentation

## 2016-01-05 DIAGNOSIS — Y999 Unspecified external cause status: Secondary | ICD-10-CM | POA: Insufficient documentation

## 2016-01-05 DIAGNOSIS — W1839XA Other fall on same level, initial encounter: Secondary | ICD-10-CM | POA: Insufficient documentation

## 2016-01-05 DIAGNOSIS — Z79899 Other long term (current) drug therapy: Secondary | ICD-10-CM | POA: Insufficient documentation

## 2016-01-05 DIAGNOSIS — Y939 Activity, unspecified: Secondary | ICD-10-CM | POA: Insufficient documentation

## 2016-01-05 LAB — URINALYSIS, ROUTINE W REFLEX MICROSCOPIC
Bilirubin Urine: NEGATIVE
GLUCOSE, UA: NEGATIVE mg/dL
HGB URINE DIPSTICK: NEGATIVE
KETONES UR: NEGATIVE mg/dL
Leukocytes, UA: NEGATIVE
Nitrite: NEGATIVE
PH: 5.5 (ref 5.0–8.0)
PROTEIN: NEGATIVE mg/dL
Specific Gravity, Urine: 1.013 (ref 1.005–1.030)

## 2016-01-05 LAB — PREGNANCY, URINE: Preg Test, Ur: NEGATIVE

## 2016-01-05 MED ORDER — NAPROXEN 250 MG PO TABS
500.0000 mg | ORAL_TABLET | Freq: Once | ORAL | Status: AC
  Administered 2016-01-05: 500 mg via ORAL
  Filled 2016-01-05: qty 2

## 2016-01-05 MED ORDER — METHOCARBAMOL 500 MG PO TABS: 1000.0000 mg | ORAL_TABLET | Freq: Four times a day (QID) | ORAL | 0 refills | Status: DC

## 2016-01-05 MED ORDER — NAPROXEN 500 MG PO TABS: 500.0000 mg | ORAL_TABLET | Freq: Two times a day (BID) | ORAL | 0 refills | Status: DC

## 2016-01-05 NOTE — Discharge Instructions (Signed)
Please read and follow all provided instructions.  Your diagnoses today include:  1. Strain of lumbar region, initial encounter     Tests performed today include:  Vital signs - see below for your results today  Medications prescribed:   Robaxin (methocarbamol) - muscle relaxer medication  DO NOT drive or perform any activities that require you to be awake and alert because this medicine can make you drowsy.    Naproxen - anti-inflammatory pain medication  Do not exceed 500mg  naproxen every 12 hours, take with food  You have been prescribed an anti-inflammatory medication or NSAID. Take with food. Take smallest effective dose for the shortest duration needed for your pain. Stop taking if you experience stomach pain or vomiting.   Take any prescribed medications only as directed.  Home care instructions:   Follow any educational materials contained in this packet  Please rest, use ice or heat on your back for the next several days  Do not lift, push, pull anything more than 10 pounds for the next week  Follow-up instructions: Please follow-up with your primary care provider in the next 1 week for further evaluation of your symptoms.   Return instructions:  SEEK IMMEDIATE MEDICAL ATTENTION IF YOU HAVE:  New numbness, tingling, weakness, or problem with the use of your arms or legs  Severe back pain not relieved with medications  Loss control of your bowels or bladder  Increasing pain in any areas of the body (such as chest or abdominal pain)  Shortness of breath, dizziness, or fainting.   Worsening nausea (feeling sick to your stomach), vomiting, fever, or sweats  Any other emergent concerns regarding your health   Additional Information:  Your vital signs today were: BP 138/91 (BP Location: Right Arm)    Pulse 80    Temp 98 F (36.7 C) (Oral)    Resp 18    Ht 5' (1.524 m)    Wt 94.8 kg    LMP 12/22/2015    SpO2 98%    BMI 40.82 kg/m  If your blood pressure  (BP) was elevated above 135/85 this visit, please have this repeated by your doctor within one month. --------------

## 2016-01-05 NOTE — ED Triage Notes (Signed)
Pt reports that she has had lower bilateral back pain x 2 days since cheering at a football game.  Worsening pain with movement.

## 2016-01-05 NOTE — ED Provider Notes (Signed)
[facility] Provider Note   CSN: [id number] Arrival date & time: 01/05/16  2029  By signing my name below, I, Cheryl Mullen, attest that this documentation has been prepared under the direction and in the presence of [employer], PA-C. Electronically Signed: Linna Mullen, Scribe. 01/05/2016. 10:33 PM.  History   Chief Complaint Chief Complaint  Patient presents with  . Back Pain    The history is provided by the patient. No language interpreter was used.     HPI Comments: Cheryl Mullen is a 25 y.o. female who presents to the Emergency Department complaining of sudden onset, constant, worsening, bilateral lower back pain for the last couple of days. 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 She states her bilateral lower back has been in pain since and worsened today. Pt has tried a hot bath for her back pain with no relief but has not tried any medications. She has no known allergies to medications. Pt denies bowel/bladder incontinence, difficulty urinating, numbness/tingling, or any other associated symptoms.  Past Medical History:  Diagnosis Date  . Allergy   . Anemia   . Eczema     Patient Active Problem List   Diagnosis Date Noted  . IDA (iron deficiency anemia) 07/19/2015  . Menorrhagia with irregular cycle 07/19/2015  . Panic 05/31/2015  . Vaginitis 02/07/2015  . Irregular periods 11/12/2014  . Obesity (BMI 30-39.9) 05/17/2013  . Eczema 05/17/2013  . Surveillance for Depo-Provera contraception 05/17/2013  . Discoloration of skin 01/17/2013    Past Surgical History:  Procedure Laterality Date  . TONSILLECTOMY AND ADENOIDECTOMY Bilateral 01/02/2013    OB History    No data available       Home Medications    Prior to Admission medications   Medication Sig Start Date End Date Taking? Authorizing Provider  cetirizine (ZYRTEC) 10 MG tablet Take 1 tablet (10 mg total) by mouth  daily. 11/12/14   Lelon Perla Chase, DO  ferrous sulfate 325 (65 FE) MG tablet Take 1 tablet (325 mg total) by mouth 2 (two) times daily with a meal. 10/04/15   Lelon Perla Chase, DO  folic acid (FOLVITE) 1 MG tablet Take 1 tablet (1 mg total) by mouth daily. 07/19/15   Josph Macho, MD  Norgestimate-Ethinyl Estradiol Triphasic (TRI-SPRINTEC) 0.18/0.215/0.25 MG-35 MCG tablet Take 1 tablet by mouth at the same time every day. 07/22/15   Lelon Perla Chase, DO  triamcinolone ointment (KENALOG) 0.1 % Apply 1 application topically 2 (two) times daily. 05/30/15   Donato Schultz, DO    Family History Family History  Problem Relation Age of Onset  . Hypertension Mother   . Hyperlipidemia Mother   . Hypertension Father   . Hyperlipidemia Father   . Hypertension Maternal Grandmother   . Hypertension Maternal Grandfather   . Diabetes Maternal Grandfather   . Hypertension Paternal Grandmother   . Hypertension Paternal Grandfather     Social History Social History  Substance Use Topics  . Smoking status: Never Smoker  . Smokeless tobacco: Never Used  . Alcohol use 0.0 oz/week     Allergies   Review of patient's allergies indicates no known allergies.   Review of Systems Review of Systems  Constitutional: Negative for fever and unexpected weight change.  Gastrointestinal: Negative for constipation.       Negative for bowel incontinence.  Genitourinary: Negative for difficulty urinating, dysuria, flank pain, hematuria,  pelvic pain, vaginal bleeding and vaginal discharge.       Negative for bladder incontinence.  Musculoskeletal: Positive for back pain (bilateral lower).  Neurological: Negative for weakness and numbness.       Denies saddle paresthesias.    Physical Exam Updated Vital Signs BP 138/91 (BP Location: Right Arm)   Pulse 80   Temp 98 F (36.7 C) (Oral)   Resp 18   Ht 5' (1.524 m)   Wt 209 lb (94.8 kg)   LMP 12/22/2015   SpO2 98%   BMI 40.82 kg/m    Physical Exam  Constitutional: She is oriented to person, place, and time. She appears well-developed and well-nourished. No distress.  HENT:  Head: Normocephalic and atraumatic.  Eyes: Conjunctivae and EOM are normal.  Neck: Normal range of motion. Neck supple. No tracheal deviation present.  Cardiovascular: Normal rate.   Pulmonary/Chest: Effort normal. No respiratory distress.  Abdominal: Soft. There is no tenderness. There is no CVA tenderness.  Musculoskeletal: Normal range of motion.       Right shoulder: Normal.       Left shoulder: Normal.       Right hip: Normal.       Left hip: Normal.       Cervical back: She exhibits normal range of motion, no tenderness and no bony tenderness.       Thoracic back: She exhibits normal range of motion, no tenderness and no bony tenderness.       Lumbar back: She exhibits tenderness. She exhibits normal range of motion and no bony tenderness.       Back:  No step-off noted with palpation of spine.   Neurological: She is alert and oriented to person, place, and time. She has normal strength and normal reflexes. No sensory deficit.  5/5 strength in entire lower extremities bilaterally. No sensation deficit.   Skin: Skin is warm and dry. No rash noted.  Psychiatric: She has a normal mood and affect. Her behavior is normal.  Nursing note and vitals reviewed.   ED Treatments / Results  Labs (all labs ordered are listed, but only abnormal results are displayed) Labs Reviewed  URINALYSIS, ROUTINE W REFLEX MICROSCOPIC (NOT AT [facility])  PREGNANCY, URINE   Procedures Procedures (including critical care time)  DIAGNOSTIC STUDIES: Oxygen Saturation is 98% on RA, normal by my interpretation.    COORDINATION OF CARE: 10:36 PM Discussed treatment plan with pt at bedside and pt agreed to plan.  Medications Ordered in ED Medications  naproxen (NAPROSYN) tablet 500 mg (500 mg Oral Given 01/05/16 2249)    Initial Impression / Assessment and  Plan / ED Course  I have reviewed the triage vital signs and the nursing notes.  Pertinent labs & imaging results that were available during my care of the patient were reviewed by me and considered in my medical decision making (see chart for details).  Clinical Course   Patient seen and examined.    Vital signs reviewed and are as follows: Vitals:   01/05/16 2033  BP: 138/91  Pulse: 80  Resp: 18  Temp: 98 F (36.7 C)    No red flag s/s of low back pain. Patient was counseled on back pain precautions and told to do activity as tolerated but do not lift, push, or pull heavy objects more than 10 pounds for the next week.  Patient counseled to use ice or heat on back for no longer than 15 minutes every hour.   Patient  prescribed muscle relaxer and counseled on proper use of muscle relaxant medication.    Urged patient not to drink alcohol, drive, or perform any other activities that requires focus while taking either of these medications.  Patient urged to follow-up with PCP if pain does not improve with treatment and rest or if pain becomes recurrent. Urged to return with worsening severe pain, loss of bowel or bladder control, trouble walking.   The patient verbalizes understanding and agrees with the plan.  I personally performed the services described in this documentation, which was scribed in my presence. The recorded information has been reviewed and is accurate.   Final Clinical Impressions(s) / ED Diagnoses   Final diagnoses:  Strain of lumbar region, initial encounter   Patient with back pain. No neurological deficits. Patient is ambulatory. No warning symptoms of back pain including: fecal incontinence, urinary retention or overflow incontinence, night sweats, waking from sleep with back pain, unexplained fevers or weight loss, h/o cancer, IVDU, recent trauma. No concern for cauda equina, epidural abscess, or other serious cause of back pain. Conservative measures such  as rest, ice/heat and pain medicine indicated with PCP follow-up if no improvement with conservative management.    New Prescriptions New Prescriptions   METHOCARBAMOL (ROBAXIN) 500 MG TABLET    Take 2 tablets (1,000 mg total) by mouth 4 (four) times daily.   NAPROXEN (NAPROSYN) 500 MG TABLET    Take 1 tablet (500 mg total) by mouth 2 (two) times daily.     Renne CriglerJoshua Taras Rask, PA-C 01/05/16 2257    Rolan BuccoMelanie Belfi, MD 01/05/16 2337

## 2016-04-08 ENCOUNTER — Other Ambulatory Visit: Payer: Self-pay | Admitting: Family Medicine

## 2016-04-08 DIAGNOSIS — L309 Dermatitis, unspecified: Secondary | ICD-10-CM

## 2016-04-09 ENCOUNTER — Other Ambulatory Visit: Payer: Self-pay | Admitting: Family Medicine

## 2016-04-09 DIAGNOSIS — L309 Dermatitis, unspecified: Secondary | ICD-10-CM

## 2016-04-09 MED ORDER — TRIAMCINOLONE ACETONIDE 0.1 % EX OINT: 1.0000 "application " | TOPICAL_OINTMENT | Freq: Two times a day (BID) | CUTANEOUS | 0 refills | Status: DC

## 2016-04-16 ENCOUNTER — Other Ambulatory Visit (HOSPITAL_BASED_OUTPATIENT_CLINIC_OR_DEPARTMENT_OTHER): Payer: Self-pay

## 2016-04-16 ENCOUNTER — Ambulatory Visit (HOSPITAL_BASED_OUTPATIENT_CLINIC_OR_DEPARTMENT_OTHER): Payer: Self-pay | Admitting: Family

## 2016-04-16 ENCOUNTER — Telehealth: Payer: Self-pay | Admitting: *Deleted

## 2016-04-16 VITALS — BP 126/66 | HR 88 | Temp 98.1°F | Resp 18

## 2016-04-16 DIAGNOSIS — N921 Excessive and frequent menstruation with irregular cycle: Secondary | ICD-10-CM

## 2016-04-16 DIAGNOSIS — D500 Iron deficiency anemia secondary to blood loss (chronic): Secondary | ICD-10-CM

## 2016-04-16 DIAGNOSIS — D509 Iron deficiency anemia, unspecified: Secondary | ICD-10-CM

## 2016-04-16 LAB — IRON AND TIBC
%SAT: 23 % (ref 21–57)
IRON: 59 ug/dL (ref 41–142)
TIBC: 256 ug/dL (ref 236–444)
UIBC: 198 ug/dL (ref 120–384)

## 2016-04-16 LAB — CBC WITH DIFFERENTIAL (CANCER CENTER ONLY)
BASO#: 0 10*3/uL (ref 0.0–0.2)
BASO%: 0.1 % (ref 0.0–2.0)
EOS%: 1 % (ref 0.0–7.0)
Eosinophils Absolute: 0.1 10*3/uL (ref 0.0–0.5)
HCT: 38.2 % (ref 34.8–46.6)
HEMOGLOBIN: 13.4 g/dL (ref 11.6–15.9)
LYMPH#: 2.7 10*3/uL (ref 0.9–3.3)
LYMPH%: 39.3 % (ref 14.0–48.0)
MCH: 30.8 pg (ref 26.0–34.0)
MCHC: 35.1 g/dL (ref 32.0–36.0)
MCV: 88 fL (ref 81–101)
MONO#: 0.6 10*3/uL (ref 0.1–0.9)
MONO%: 9.2 % (ref 0.0–13.0)
NEUT%: 50.4 % (ref 39.6–80.0)
NEUTROS ABS: 3.4 10*3/uL (ref 1.5–6.5)
PLATELETS: 231 10*3/uL (ref 145–400)
RBC: 4.35 10*6/uL (ref 3.70–5.32)
RDW: 12.7 % (ref 11.1–15.7)
WBC: 6.8 10*3/uL (ref 3.9–10.0)

## 2016-04-16 LAB — FERRITIN: FERRITIN: 108 ng/mL (ref 9–269)

## 2016-04-16 NOTE — Telephone Encounter (Addendum)
Patient aware of results  ----- Message from Verdie MosherSarah M [place], NP sent at 04/16/2016  2:11 PM EST ----- Regarding: Iron  Iron studies look good. No infusion needed. Thank you!  Sarah  ----- Message ----- From: Josph MachoPeter R Ennever, MD Sent: 04/16/2016   1:39 PM To: Verdie MosherSarah M [place], NP    ----- Message ----- From: Interface, Lab In Three Zero One Sent: 04/16/2016   8:30 AM To: Josph MachoPeter R Ennever, MD

## 2016-04-16 NOTE — Progress Notes (Signed)
Hematology and Oncology Follow Up Visit  Cheryl Mullen [id number] 05-Sep-1990 25 y.o. 04/16/2016   Principle Diagnosis:  Iron deficiency anemia secondary to menorrhagia  Current Therapy:   IV iron as indicated - last received in June 2017    Interim History:  Ms. Cheryl Mullen is here today for a follow-up. She is doing quite well. Her cycles are now regular lasting 4-5 days a month. Her flow is also much lighter.  She has not required IV iron since June of last year. Her iron studies in September were stable.  No fever, chills, n/v, cough, rash, dizziness, ice cravings, SOB, chest pain, palpitations, abdominal pain or changes in bowel or bladder habits.  No swelling, tenderness, numbness or tingling in her extremities. No c/o joint aches or bone pain.  She is maintained a good appetite and staying well hydrated. Her weight is stable.   Medications:  Allergies as of 04/16/2016   No Known Allergies     Medication List       Accurate as of 04/16/16  8:53 AM. Always use your most recent med list.          cetirizine 10 MG tablet Commonly known as:  ZYRTEC Take 1 tablet (10 mg total) by mouth daily.   ferrous sulfate 325 (65 FE) MG tablet Take 1 tablet (325 mg total) by mouth 2 (two) times daily with a meal.   folic acid 1 MG tablet Commonly known as:  FOLVITE Take 1 tablet (1 mg total) by mouth daily.   methocarbamol 500 MG tablet Commonly known as:  ROBAXIN Take 2 tablets (1,000 mg total) by mouth 4 (four) times daily.   naproxen 500 MG tablet Commonly known as:  NAPROSYN Take 1 tablet (500 mg total) by mouth 2 (two) times daily.   Norgestimate-Ethinyl Estradiol Triphasic 0.18/0.215/0.25 MG-35 MCG tablet Commonly known as:  TRI-SPRINTEC Take 1 tablet by mouth at the same time every day.   triamcinolone ointment 0.1 % Commonly known as:  KENALOG Apply 1 application topically 2 (two) times daily. Needs appointment for further refills.       Allergies: No Known  Allergies  Past Medical History, Surgical history, Social history, and Family History were reviewed and updated.  Review of Systems: All other 10 point review of systems is negative.   Physical Exam:  vitals were not taken for this visit.  Wt Readings from Last 3 Encounters:  01/05/16 209 lb (94.8 kg)  12/16/15 210 lb (95.3 kg)  12/16/15 209 lb (94.8 kg)    Ocular: Sclerae unicteric, pupils equal, round and reactive to light Ear-nose-throat: Oropharynx clear, dentition fair Lymphatic: No cervical supraclavicular or axillary adenopathy Lungs no rales or rhonchi, good excursion bilaterally Heart regular rate and rhythm, no murmur appreciated Abd soft, nontender, positive bowel sounds, no liver or spleen tip palpated on exam MSK no focal spinal tenderness, no joint edema Neuro: non-focal, well-oriented, appropriate affect Breasts: Deferred  Lab Results  Component Value Date   WBC 6.8 04/16/2016   HGB 13.4 04/16/2016   HCT 38.2 04/16/2016   MCV 88 04/16/2016   PLT 231 04/16/2016   Lab Results  Component Value Date   FERRITIN 282 (H) 12/16/2015   IRON 69 12/16/2015   TIBC 235 (L) 12/16/2015   UIBC 166 12/16/2015   IRONPCTSAT 29 12/16/2015   Lab Results  Component Value Date   RBC 4.35 04/16/2016   No results found for: KPAFRELGTCHN, LAMBDASER, KAPLAMBRATIO No results found for: IGGSERUM, IGA, IGMSERUM No results found  for: Marda StalkerOTALPROTELP, ALBUMINELP, A1GS, A2GS, BETS, BETA2SER, GAMS, MSPIKE, SPEI   Chemistry      Component Value Date/Time   NA 137 07/03/2015 1647   K 4.1 07/03/2015 1647   CL 104 07/03/2015 1647   CO2 25 07/03/2015 1647   BUN 9 07/03/2015 1647   CREATININE 0.93 07/03/2015 1647      Component Value Date/Time   CALCIUM 9.1 07/03/2015 1647   ALKPHOS 75 07/03/2015 1647   AST 17 07/03/2015 1647   ALT 16 07/03/2015 1647   BILITOT 0.3 07/03/2015 1647     Impression and Plan: Cheryl Mullen is a very pleasant 26 yo African American female with history  of anemia secondary to menorrhagia. She is now on birth control and her cycles have become regular and much lighter. She is doing well and is asymptomatic at this time.  We will see what her iron studies show and bring her back in next week for an infusion if needed.  We will plan to see her back in 6 months for repeat labs and follow-up.  She will contact our offcie with any questions or concerns. We can certainly see her sooner if need be.   Verdie MosherINCINNATI,SARAH M, NP 1/11/20188:53 AM

## 2016-04-17 LAB — RETICULOCYTES: Reticulocyte Count: 1.9 % (ref 0.6–2.6)

## 2016-05-25 ENCOUNTER — Encounter: Payer: Self-pay | Admitting: Family Medicine

## 2016-05-26 NOTE — Telephone Encounter (Signed)
This does not need to come to me-- please make appointmemt

## 2016-05-29 ENCOUNTER — Encounter: Payer: Self-pay | Admitting: Family Medicine

## 2016-05-29 ENCOUNTER — Ambulatory Visit (INDEPENDENT_AMBULATORY_CARE_PROVIDER_SITE_OTHER): Payer: Self-pay | Admitting: Family Medicine

## 2016-05-29 VITALS — BP 126/68 | HR 101 | Temp 98.5°F | Resp 16 | Ht 60.0 in | Wt 217.0 lb

## 2016-05-29 DIAGNOSIS — Z889 Allergy status to unspecified drugs, medicaments and biological substances status: Secondary | ICD-10-CM

## 2016-05-29 DIAGNOSIS — N912 Amenorrhea, unspecified: Secondary | ICD-10-CM

## 2016-05-29 DIAGNOSIS — L309 Dermatitis, unspecified: Secondary | ICD-10-CM

## 2016-05-29 LAB — CBC WITH DIFFERENTIAL/PLATELET
BASOS ABS: 0 {cells}/uL (ref 0–200)
Basophils Relative: 0 %
EOS ABS: 126 {cells}/uL (ref 15–500)
EOS PCT: 2 %
HCT: 41.6 % (ref 35.0–45.0)
Hemoglobin: 13.9 g/dL (ref 11.7–15.5)
LYMPHS PCT: 36 %
Lymphs Abs: 2268 cells/uL (ref 850–3900)
MCH: 30.3 pg (ref 27.0–33.0)
MCHC: 33.4 g/dL (ref 32.0–36.0)
MCV: 90.8 fL (ref 80.0–100.0)
MPV: 10.4 fL (ref 7.5–12.5)
Monocytes Absolute: 378 cells/uL (ref 200–950)
Monocytes Relative: 6 %
NEUTROS ABS: 3528 {cells}/uL (ref 1500–7800)
Neutrophils Relative %: 56 %
PLATELETS: 274 10*3/uL (ref 140–400)
RBC: 4.58 MIL/uL (ref 3.80–5.10)
RDW: 13.6 % (ref 11.0–15.0)
WBC: 6.3 10*3/uL (ref 3.8–10.8)

## 2016-05-29 LAB — POCT URINE PREGNANCY: PREG TEST UR: NEGATIVE

## 2016-05-29 MED ORDER — MONTELUKAST SODIUM 10 MG PO TABS: 10.0000 mg | ORAL_TABLET | Freq: Every day | ORAL | 3 refills | Status: DC

## 2016-05-29 MED ORDER — TRIAMCINOLONE ACETONIDE 0.1 % EX OINT: 1.0000 "application " | TOPICAL_OINTMENT | Freq: Two times a day (BID) | CUTANEOUS | 3 refills | Status: DC

## 2016-05-29 MED ORDER — PRENATAL 27-1 MG PO TABS: ORAL_TABLET | ORAL | 3 refills | Status: DC

## 2016-05-29 NOTE — Patient Instructions (Signed)
Primary Amenorrhea Primary amenorrhea is the absence of any menstrual flow in a female by the age of 15 years. An average age for the start of menstruation is the age of 12 years. Primary amenorrhea is not considered to have occurred until a female is older than 15 years and has never menstruated. This may occur with or without other signs of puberty. What are the causes? Some common causes of not menstruating include:  Chromosomal abnormality causing the ovaries to malfunction is the most common cause of primary amenorrhea.  Malnutrition.  Low blood sugar (hypoglycemia).  Polycystic ovary syndrome (cysts in the ovaries, not ovulating).  Absence of the vagina, uterus, or ovaries since birth (congenital).  Extreme obesity.  Cystic fibrosis.  Drastic weight loss from any cause.  Over-exercising (running, biking) causing loss of body fat.  Pituitary gland tumor in the brain.  Long-term (chronic) illnesses.  Cushing disease.  Thyroid disease (hypothyroidism, hyperthyroidism).  Part of the brain (hypothalamus) not functioning normally.  Premature ovarian failure. What are the signs or symptoms? No menstruation by age 15 years in normally developed females is the primary symptom. Other symptoms may include:  Discharge from the breasts.  Hot flashes.  Adult acne.  Facial or chest hair.  Headaches.  Impaired vision.  Recent stress.  Changes in weight, diet, or exercise patterns. How is this diagnosed? Primary amenorrhea is diagnosed with the help of a medical history and a physical exam. Other tests that may be recommended include:  Blood tests to check for pregnancy, hormonal changes, a bleeding or thyroid disorder, low iron levels (anemia), or other problems.  Urine tests.  Specialized X-ray exams. How is this treated? Treatment will depend on the cause. For example, some of the causes of primary amenorrhea, such as congenital absence of sex organs, will require  surgery to correct. Others may respond to treatment with medicine. Contact a health care provider if:  There has not been any menstrual flow by age 15 years.  Body maturation does not occur at a level typical of peers.  Pelvic area pain occurs.  There is unusual weight gain or hair growth. This information is not intended to replace advice given to you by your health care provider. Make sure you discuss any questions you have with your health care provider. Document Released: 03/23/2005 Document Revised: 08/29/2015 Document Reviewed: 11/02/2012 Elsevier Interactive Patient Education  2017 Elsevier Inc.  

## 2016-05-29 NOTE — Progress Notes (Signed)
Patient ID: Cheryl Mullen, female    DOB: 16-Jul-1990  Age: 26 y.o. MRN: [id number]    Subjective:  Subjective  HPI Cheryl Mullen presents for c/o no cycle this month.  LMP approx 10-14 Jan.   Period was lighter than usual   Review of Systems  Constitutional: Negative for activity change, appetite change, chills, diaphoresis, fatigue, fever and unexpected weight change.  Eyes: Negative for pain, redness and visual disturbance.  Respiratory: Negative for cough, chest tightness, shortness of breath and wheezing.   Cardiovascular: Negative for chest pain, palpitations and leg swelling.  Gastrointestinal: Negative for abdominal distention and abdominal pain.  Endocrine: Negative for cold intolerance, heat intolerance, polydipsia, polyphagia and polyuria.  Genitourinary: Negative for difficulty urinating, dyspareunia, dysuria, flank pain, frequency, genital sores, hematuria, menstrual problem, pelvic pain, urgency, vaginal discharge and vaginal pain.  Musculoskeletal: Negative for back pain.  Neurological: Negative for dizziness, light-headedness, numbness and headaches.    History Past Medical History:  Diagnosis Date  . Allergy   . Anemia   . Eczema     She has a past surgical history that includes Tonsillectomy and adenoidectomy (Bilateral, 01/02/2013).   Her family history includes Diabetes in her maternal grandfather; Hyperlipidemia in her father and mother; Hypertension in her father, maternal grandfather, maternal grandmother, mother, paternal grandfather, and paternal grandmother.She reports that she has never smoked. She has never used smokeless tobacco. She reports that she drinks alcohol. She reports that she does not use drugs.  Current Outpatient Prescriptions on File Prior to Visit  Medication Sig Dispense Refill  . folic acid (FOLVITE) 1 MG tablet Take 1 tablet (1 mg total) by mouth daily. 30 tablet 3   Current Facility-Administered Medications on File Prior to Visit    Medication Dose Route Frequency Provider Last Rate Last Dose  . azithromycin (ZITHROMAX) tablet 1,000 mg  1,000 mg Oral Daily Sandford CrazeMelissa O'Sullivan, NP   1,000 mg at 12/16/15 1138     Objective:  Objective  Physical Exam  Constitutional: She is oriented to person, place, and time. She appears well-developed and well-nourished. No distress.  HENT:  Right Ear: External ear normal.  Left Ear: External ear normal.  Nose: Nose normal.  Mouth/Throat: Oropharynx is clear and moist.  Eyes: EOM are normal. Pupils are equal, round, and reactive to light.  Neck: Normal range of motion. Neck supple.  Cardiovascular: Normal rate, regular rhythm and normal heart sounds.   No murmur heard. Pulmonary/Chest: Effort normal and breath sounds normal. No respiratory distress. She has no wheezes. She has no rales. She exhibits no tenderness.  Neurological: She is alert and oriented to person, place, and time.  Psychiatric: She has a normal mood and affect. Her behavior is normal. Judgment and thought content normal.  Nursing note and vitals reviewed.  BP 126/68 (BP Location: Left Arm, Cuff Size: Normal)   Pulse (!) 101   Temp 98.5 F (36.9 C) (Oral)   Resp 16   Ht 5' (1.524 m)   Wt 217 lb (98.4 kg)   LMP 04/06/2016   SpO2 96%   BMI 42.38 kg/m  Wt Readings from Last 3 Encounters:  05/29/16 217 lb (98.4 kg)  01/05/16 209 lb (94.8 kg)  12/16/15 210 lb (95.3 kg)     Lab Results  Component Value Date   WBC 6.8 04/16/2016   HGB 13.4 04/16/2016   HCT 38.2 04/16/2016   PLT 231 04/16/2016   GLUCOSE 93 07/03/2015   ALT 16 07/03/2015   AST 17 07/03/2015  NA 137 07/03/2015   K 4.1 07/03/2015   CL 104 07/03/2015   CREATININE 0.93 07/03/2015   BUN 9 07/03/2015   CO2 25 07/03/2015    No results found.   Assessment & Plan:  Plan  I have discontinued [name]'s cetirizine, Norgestimate-Ethinyl Estradiol Triphasic, ferrous sulfate, and naproxen. I am also having her start on montelukast and  PRENATAL. Additionally, I am having her maintain her folic acid and triamcinolone ointment. We will continue to administer azithromycin.  Meds ordered this encounter  Medications  . montelukast (SINGULAIR) 10 MG tablet    Sig: Take 1 tablet (10 mg total) by mouth at bedtime.    Dispense:  90 tablet    Refill:  3  . triamcinolone ointment (KENALOG) 0.1 %    Sig: Apply 1 application topically 2 (two) times daily. Needs appointment for further refills.    Dispense:  60 g    Refill:  3  . PRENATAL 27-1 MG TABS    Sig: 1 po qd    Dispense:  90 each    Refill:  3    Problem List Items Addressed This Visit      Unprioritized   Eczema   Relevant Medications   triamcinolone ointment (KENALOG) 0.1 %    Other Visit Diagnoses    Amenorrhea    -  Primary   Relevant Medications   PRENATAL 27-1 MG TABS   Other Relevant Orders   POCT urine pregnancy (Completed)   Ambulatory referral to Obstetrics / Gynecology   B-HCG Quant   CBC w/Diff   Follicle stimulating hormone   T4, free   T3, free   TSH   H/O seasonal allergies       Relevant Medications   montelukast (SINGULAIR) 10 MG tablet      Follow-up: No Follow-up on file.  Donato Schultz, DO

## 2016-05-29 NOTE — Progress Notes (Signed)
Pre visit review using our clinic review tool, if applicable. No additional management support is needed unless otherwise documented below in the visit note. 

## 2016-05-30 ENCOUNTER — Other Ambulatory Visit: Payer: Self-pay | Admitting: Family Medicine

## 2016-05-30 LAB — T3, FREE: T3, Free: 3.3 pg/mL (ref 2.3–4.2)

## 2016-05-30 LAB — HCG, QUANTITATIVE, PREGNANCY

## 2016-05-30 LAB — T4, FREE: Free T4: 1 ng/dL (ref 0.8–1.8)

## 2016-05-30 LAB — TSH: TSH: 0.95 m[IU]/L

## 2016-05-30 LAB — FOLLICLE STIMULATING HORMONE: FSH: 2.4 m[IU]/mL

## 2016-06-05 ENCOUNTER — Encounter: Payer: Self-pay | Admitting: Family Medicine

## 2016-06-19 ENCOUNTER — Encounter: Payer: Self-pay | Admitting: Obstetrics & Gynecology

## 2016-06-23 ENCOUNTER — Encounter: Payer: Self-pay | Admitting: Family Medicine

## 2016-07-15 ENCOUNTER — Encounter: Payer: Self-pay | Admitting: Obstetrics & Gynecology

## 2016-07-15 DIAGNOSIS — N912 Amenorrhea, unspecified: Secondary | ICD-10-CM

## 2016-10-14 ENCOUNTER — Other Ambulatory Visit (HOSPITAL_BASED_OUTPATIENT_CLINIC_OR_DEPARTMENT_OTHER): Payer: Self-pay

## 2016-10-14 ENCOUNTER — Ambulatory Visit (HOSPITAL_BASED_OUTPATIENT_CLINIC_OR_DEPARTMENT_OTHER): Payer: Self-pay | Admitting: Family

## 2016-10-14 VITALS — BP 133/82 | HR 82 | Temp 98.4°F | Resp 17 | Wt 227.0 lb

## 2016-10-14 DIAGNOSIS — N921 Excessive and frequent menstruation with irregular cycle: Secondary | ICD-10-CM

## 2016-10-14 DIAGNOSIS — N920 Excessive and frequent menstruation with regular cycle: Secondary | ICD-10-CM

## 2016-10-14 DIAGNOSIS — D500 Iron deficiency anemia secondary to blood loss (chronic): Secondary | ICD-10-CM

## 2016-10-14 LAB — CBC WITH DIFFERENTIAL (CANCER CENTER ONLY)
BASO#: 0 10*3/uL (ref 0.0–0.2)
BASO%: 0 % (ref 0.0–2.0)
EOS ABS: 0.1 10*3/uL (ref 0.0–0.5)
EOS%: 1.5 % (ref 0.0–7.0)
HEMATOCRIT: 39.4 % (ref 34.8–46.6)
HEMOGLOBIN: 13.8 g/dL (ref 11.6–15.9)
LYMPH#: 2.4 10*3/uL (ref 0.9–3.3)
LYMPH%: 32.5 % (ref 14.0–48.0)
MCH: 30.3 pg (ref 26.0–34.0)
MCHC: 35 g/dL (ref 32.0–36.0)
MCV: 87 fL (ref 81–101)
MONO#: 0.6 10*3/uL (ref 0.1–0.9)
MONO%: 8.2 % (ref 0.0–13.0)
NEUT#: 4.3 10*3/uL (ref 1.5–6.5)
NEUT%: 57.8 % (ref 39.6–80.0)
Platelets: 249 10*3/uL (ref 145–400)
RBC: 4.55 10*6/uL (ref 3.70–5.32)
RDW: 12.8 % (ref 11.1–15.7)
WBC: 7.5 10*3/uL (ref 3.9–10.0)

## 2016-10-14 LAB — IRON AND TIBC
%SAT: 21 % (ref 21–57)
Iron: 54 ug/dL (ref 41–142)
TIBC: 250 ug/dL (ref 236–444)
UIBC: 196 ug/dL (ref 120–384)

## 2016-10-14 LAB — FERRITIN: Ferritin: 108 ng/ml (ref 9–269)

## 2016-10-14 NOTE — Progress Notes (Signed)
Hematology and Oncology Follow Up Visit  Mirjana Tarleton [id number] [birth year] 26 y.o. 10/14/2016   Principle Diagnosis:  Iron deficiency anemia secondary to menorrhagia  Current Therapy:   IV iron as indicated - last received in June 2017    Interim History:  Ms. Cheryl Mullen is here today for a follow-up. She is doing well and has no complaints at this time. Her cycles have been lighter only lasting 4 days. She has been a bit irregular as well sometimes going almost 2 months without a cycle.  She denies fatigue at this time. No chewing ice.  No fever, chills, n/v, cough, rash, dizziness, SOB, chest pain, palpitations, abdominal pain or changes in bowel or bladder habits.  No swelling, tenderness, numbness or tingling in her extremities. No c/o pain.  She has maintained a good appetite and is staying well hydrated. Her weight is stable.  She is excited for a trip with her church to [facility] next week. They have been planning this for a while.   ECOG Performance Status: 1 - Symptomatic but completely ambulatory  Medications:  Allergies as of 10/14/2016   No Known Allergies     Medication List       Accurate as of 10/14/16  8:45 AM. Always use your most recent med list.          folic acid 1 MG tablet Commonly known as:  FOLVITE Take 1 tablet (1 mg total) by mouth daily.   montelukast 10 MG tablet Commonly known as:  SINGULAIR Take 1 tablet (10 mg total) by mouth at bedtime.   PRENATAL 27-1 MG Tabs 1 po qd   triamcinolone ointment 0.1 % Commonly known as:  KENALOG Apply 1 application topically 2 (two) times daily. Needs appointment for further refills.       Allergies: No Known Allergies  Past Medical History, Surgical history, Social history, and Family History were reviewed and updated.  Review of Systems: All other 10 point review of systems is negative.   Physical Exam:  vitals were not taken for this visit.  Wt Readings from Last 3 Encounters:  05/29/16 217  lb (98.4 kg)  01/05/16 209 lb (94.8 kg)  12/16/15 210 lb (95.3 kg)    Ocular: Sclerae unicteric, pupils equal, round and reactive to light Ear-nose-throat: Oropharynx clear, dentition fair Lymphatic: No cervical, supraclavicular or axillary adenopathy Lungs no rales or rhonchi, good excursion bilaterally Heart regular rate and rhythm, no murmur appreciated Abd soft, nontender, positive bowel sounds, no liver or spleen tip palpated on exam, no fluid wave MSK no focal spinal tenderness, no joint edema Neuro: non-focal, well-oriented, appropriate affect Breasts: Deferred   Lab Results  Component Value Date   WBC 6.3 05/29/2016   HGB 13.9 05/29/2016   HCT 41.6 05/29/2016   MCV 90.8 05/29/2016   PLT 274 05/29/2016   Lab Results  Component Value Date   FERRITIN 108 04/16/2016   IRON 59 04/16/2016   TIBC 256 04/16/2016   UIBC 198 04/16/2016   IRONPCTSAT 23 04/16/2016   Lab Results  Component Value Date   RBC 4.58 05/29/2016   No results found for: KPAFRELGTCHN, LAMBDASER, KAPLAMBRATIO No results found for: IGGSERUM, IGA, IGMSERUM No results found for: Dorene Ar, A1GS, A2GS, Karn Pickler, SPEI   Chemistry      Component Value Date/Time   NA 137 07/03/2015 1647   K 4.1 07/03/2015 1647   CL 104 07/03/2015 1647   CO2 25 07/03/2015 1647   BUN 9 07/03/2015  1647   CREATININE 0.93 07/03/2015 1647      Component Value Date/Time   CALCIUM 9.1 07/03/2015 1647   ALKPHOS 75 07/03/2015 1647   AST 17 07/03/2015 1647   ALT 16 07/03/2015 1647   BILITOT 0.3 07/03/2015 1647      Impression and Plan: Ms. Cheryl LarssonMalachi is a very pleasant 26 yo African American female with iron deficiency anemia secondary to menorrhagia. Her cycles are now much lighter and she is asymptomatic at this time. She has not required IV iron in over a year.  Hgb is stable at 13.8 with an MCV of 87. We will see what her iron studies show and bring her back in later this week for an  infusion if needed.  We will go ahead and plan to see her back in 6 months for repeat labs and follow-up.  She will contact our office with any questions or concerns. We can certainly see her sooner if need be.   Verdie MosherINCINNATI,Olliver Boyadjian M, NP 7/11/20188:45 AM

## 2016-10-15 LAB — RETICULOCYTES: RETICULOCYTE COUNT: 2.5 % (ref 0.6–2.6)

## 2016-11-04 ENCOUNTER — Encounter: Payer: Self-pay | Admitting: Family Medicine

## 2016-11-05 ENCOUNTER — Ambulatory Visit: Payer: Self-pay

## 2016-11-05 NOTE — Telephone Encounter (Signed)
Patient scheduled nurse visit for pregnancy test today at 3:45pm as per PCP recommendation.

## 2016-11-10 ENCOUNTER — Ambulatory Visit (INDEPENDENT_AMBULATORY_CARE_PROVIDER_SITE_OTHER): Payer: Self-pay | Admitting: Internal Medicine

## 2016-11-10 ENCOUNTER — Encounter: Payer: Self-pay | Admitting: Internal Medicine

## 2016-11-10 VITALS — BP 116/68 | HR 86 | Temp 98.2°F | Resp 14 | Ht 60.0 in | Wt 227.2 lb

## 2016-11-10 DIAGNOSIS — Z3201 Encounter for pregnancy test, result positive: Secondary | ICD-10-CM

## 2016-11-10 DIAGNOSIS — N912 Amenorrhea, unspecified: Secondary | ICD-10-CM

## 2016-11-10 LAB — HCG, QUANTITATIVE, PREGNANCY: HCG, BETA CHAIN, QUANT, S: 41849.1 m[IU]/mL — AB

## 2016-11-10 NOTE — Progress Notes (Signed)
   Subjective:    Patient ID: Cheryl ColonelJamelia Malachi, female    DOB: [birth year], 26 y.o.   MRN: [id number]  DOS:  11/10/2016 Type of visit - description : acute Interval history: Last menstrual period was June 1. She had 2 positive pregnancy tests at home.   Review of Systems Denies abdominal pain. No nausea, vomiting. No spotting  Past Medical History:  Diagnosis Date  . Allergy   . Anemia   . Eczema     Past Surgical History:  Procedure Laterality Date  . TONSILLECTOMY AND ADENOIDECTOMY Bilateral 01/02/2013    Social History   Social History  . Marital status: Single    Spouse name: N/A  . Number of children: N/A  . Years of education: N/A   Occupational History  . [employer] group leader    Social History Main Topics  . Smoking status: Never Smoker  . Smokeless tobacco: Never Used  . Alcohol use 0.0 oz/week  . Drug use: No  . Sexual activity: Not Currently    Partners: Female    Birth control/ protection: Injection   Other Topics Concern  . Not on file   Social History Narrative   Exercise--- NO      Allergies as of 11/10/2016   No Known Allergies     Medication List       Accurate as of 11/10/16 11:59 PM. Always use your most recent med list.          montelukast 10 MG tablet Commonly known as:  SINGULAIR Take 1 tablet (10 mg total) by mouth at bedtime.   PRENATAL 27-1 MG Tabs 1 po qd   triamcinolone ointment 0.1 % Commonly known as:  KENALOG Apply 1 application topically 2 (two) times daily. Needs appointment for further refills.          Objective:   Physical Exam BP 116/68 (BP Location: Left Arm, Patient Position: Sitting, Cuff Size: Normal)   Pulse 86   Temp 98.2 F (36.8 C) (Oral)   Resp 14   Ht 5' (1.524 m)   Wt 227 lb 4 oz (103.1 kg)   LMP 09/04/2016 (Exact Date)   SpO2 98%   BMI 44.38 kg/m  General:   Well developed, well nourished . NAD.  HEENT:  Normocephalic . Face symmetric, atraumatic  Abdomen: Soft, not distended,  nontender Neurologic:  alert & oriented X3.  Speech normal, gait appropriate for age and unassisted Psych--  Cognition and judgment appear intact.  Cooperative with normal attention span and concentration.  Behavior appropriate. No anxious or depressed appearing.      Assessment & Plan:   amenorrhea Likely pregnant. Will check a serum beta-hCG. To call if she has abdominal pain or unusual vaginal bleeding. Likes to hold off OB  referral until pregnancy is confirmed.

## 2016-11-10 NOTE — Progress Notes (Signed)
Pre visit review using our clinic review tool, if applicable. No additional management support is needed unless otherwise documented below in the visit note. 

## 2016-11-10 NOTE — Patient Instructions (Signed)
Go to the labs  

## 2016-12-02 ENCOUNTER — Encounter (HOSPITAL_COMMUNITY): Payer: Self-pay | Admitting: Emergency Medicine

## 2016-12-02 ENCOUNTER — Encounter (HOSPITAL_BASED_OUTPATIENT_CLINIC_OR_DEPARTMENT_OTHER): Payer: Self-pay

## 2016-12-02 ENCOUNTER — Emergency Department (HOSPITAL_COMMUNITY)
Admission: EM | Admit: 2016-12-02 | Discharge: 2016-12-02 | Disposition: A | Discharge status: Home / Self Care | Payer: Medicaid Other | Attending: Emergency Medicine | Admitting: Emergency Medicine

## 2016-12-02 DIAGNOSIS — O9989 Other specified diseases and conditions complicating pregnancy, childbirth and the puerperium: Secondary | ICD-10-CM | POA: Diagnosis present

## 2016-12-02 DIAGNOSIS — Z3A11 11 weeks gestation of pregnancy: Secondary | ICD-10-CM | POA: Insufficient documentation

## 2016-12-02 DIAGNOSIS — Z5321 Procedure and treatment not carried out due to patient leaving prior to being seen by health care provider: Secondary | ICD-10-CM | POA: Diagnosis not present

## 2016-12-02 DIAGNOSIS — R51 Headache: Secondary | ICD-10-CM | POA: Diagnosis not present

## 2016-12-02 DIAGNOSIS — R079 Chest pain, unspecified: Secondary | ICD-10-CM | POA: Insufficient documentation

## 2016-12-02 DIAGNOSIS — Z79899 Other long term (current) drug therapy: Secondary | ICD-10-CM | POA: Diagnosis not present
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
## 2016-12-02 NOTE — ED Notes (Signed)
Patient's mother up to desk.  States they do not want to wait.  States [facility] leave [facility].  This RN spoke directly to patient, explained rationale for EMS [facility], and encouraged patient to stay.  Patient/mother state they do not want to wait and would like to be dc'd and [facility].  This RN reviewed return precautions.

## 2016-12-03 ENCOUNTER — Emergency Department (HOSPITAL_BASED_OUTPATIENT_CLINIC_OR_DEPARTMENT_OTHER): Payer: Medicaid Other

## 2016-12-03 ENCOUNTER — Emergency Department (HOSPITAL_BASED_OUTPATIENT_CLINIC_OR_DEPARTMENT_OTHER)
Admission: EM | Admit: 2016-12-03 | Discharge: 2016-12-03 | Disposition: A | Discharge status: Home / Self Care | Payer: Medicaid Other | Attending: Emergency Medicine | Admitting: Emergency Medicine

## 2016-12-03 DIAGNOSIS — S298XXA Other specified injuries of thorax, initial encounter: Secondary | ICD-10-CM

## 2016-12-03 DIAGNOSIS — Z3A11 11 weeks gestation of pregnancy: Secondary | ICD-10-CM

## 2016-12-03 MED ORDER — ACETAMINOPHEN 325 MG PO TABS
650.0000 mg | ORAL_TABLET | Freq: Once | ORAL | Status: AC
  Administered 2016-12-03: 650 mg via ORAL
  Filled 2016-12-03: qty 2

## 2016-12-03 NOTE — ED Notes (Signed)
Pt verbalizes understanding of d/c instructions and denies any further needs at this time. 

## 2016-12-03 NOTE — ED Notes (Signed)
Patient transported to X-ray 
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
## 2016-12-17 ENCOUNTER — Encounter: Payer: Self-pay | Admitting: Family Medicine

## 2016-12-17 ENCOUNTER — Other Ambulatory Visit (HOSPITAL_COMMUNITY)
Admission: RE | Admit: 2016-12-17 | Discharge: 2016-12-17 | Disposition: A | Discharge status: Home / Self Care | Payer: Medicaid Other | Source: Ambulatory Visit | Attending: Family Medicine | Admitting: Family Medicine

## 2016-12-17 ENCOUNTER — Ambulatory Visit (INDEPENDENT_AMBULATORY_CARE_PROVIDER_SITE_OTHER): Payer: Medicaid Other | Admitting: Family Medicine

## 2016-12-17 VITALS — BP 121/82 | HR 102 | Wt 224.0 lb

## 2016-12-17 DIAGNOSIS — Z3689 Encounter for other specified antenatal screening: Secondary | ICD-10-CM

## 2016-12-17 DIAGNOSIS — E669 Obesity, unspecified: Secondary | ICD-10-CM

## 2016-12-17 DIAGNOSIS — Z3401 Encounter for supervision of normal first pregnancy, first trimester: Secondary | ICD-10-CM

## 2016-12-17 DIAGNOSIS — Z3400 Encounter for supervision of normal first pregnancy, unspecified trimester: Secondary | ICD-10-CM | POA: Insufficient documentation

## 2016-12-17 DIAGNOSIS — O99211 Obesity complicating pregnancy, first trimester: Secondary | ICD-10-CM

## 2016-12-17 MED ORDER — COMPLETENATE 29-1 MG PO CHEW: 1.0000 | CHEWABLE_TABLET | Freq: Every day | ORAL | Status: DC

## 2016-12-17 NOTE — Progress Notes (Signed)
DATING AND VIABILITY SONOGRAM   Cheryl Mullen is a 26 y.o. year old G1P0 with LMP Patient's last menstrual period was 09/04/2016 (exact date). which would correlate to  5092w6d weeks gestation.  She has irregular menstrual cycles.   She is here today for a confirmatory initial sonogram.    [place]ST[place]    FETAL ACTIVITY:          Heart rate        156          The fetus is active and movement visualized on ultrasound   GESTATIONAL AGE AND  BIOMETRICS:  Gestational criteria: Estimated Date of Delivery: 06/11/17 by LMP now at 4692w6d                                                   AVERAGE EGA(BY THIS SCAN):  13 weeks  WORKING EDD( early ultrasound ): 06-24-17    Armandina StammerJennifer Howard 12/17/2016 10:43 AM

## 2016-12-17 NOTE — Progress Notes (Signed)
Subjective:  Cheryl Mullen is a G1P0 7325w0d being seen today for her first obstetrical visit.  Her obstetrical history is significant for obesity. Patient does intend to breast feed. Pregnancy history fully reviewed.  Patient reports no complaints.  BP 121/82   Pulse (!) 102   Wt 224 lb (101.6 kg)   LMP 09/04/2016 (Exact Date)   BMI 43.75 kg/m   HISTORY: OB History  Gravida Para Term Preterm AB Living  1            SAB TAB Ectopic Multiple Live Births               # Outcome Date GA Lbr Len/2nd Weight Sex Delivery Anes PTL Lv  1 Current               Past Medical History:  Diagnosis Date  . Allergy   . Anemia   . Eczema     Past Surgical History:  Procedure Laterality Date  . TONSILLECTOMY AND ADENOIDECTOMY Bilateral 01/02/2013    Family History  Problem Relation Age of Onset  . Hypertension Mother   . Hyperlipidemia Mother   . Hypertension Father   . Hyperlipidemia Father   . Hypertension Maternal Grandmother   . Hypertension Maternal Grandfather   . Diabetes Maternal Grandfather   . Hypertension Paternal Grandmother   . Hypertension Paternal Grandfather   . Cancer Neg Hx      Exam    Uterus:     Pelvic Exam:    Perineum: No Hemorrhoids, Normal Perineum   Vulva: normal, Bartholin's, Urethra, Skene's normal   Vagina:  normal mucosa   Cervix: no bleeding following Pap, no lesions and nulliparous appearance   Adnexa: not evaluated   Bony Pelvis: gynecoid  System: Breast:  normal appearance, no masses or tenderness, Inspection negative, No nipple retraction or dimpling, No nipple discharge or bleeding, No axillary or supraclavicular adenopathy   Skin: normal coloration and turgor, no rashes    Neurologic: oriented, gait normal; reflexes normal and symmetric   Extremities: normal strength, tone, and muscle mass   HEENT PERRLA and extra ocular movement intact   Mouth/Teeth mucous membranes moist, pharynx normal without lesions   Neck supple and no  masses   Cardiovascular: regular rate and rhythm, no murmurs or gallops   Respiratory:  appears well, vitals normal, no respiratory distress, acyanotic, normal RR, ear and throat exam is normal, neck free of mass or lymphadenopathy, chest clear, no wheezing, crepitations, rhonchi, normal symmetric air entry   Abdomen: soft, non-tender; bowel sounds normal; no masses,  no organomegaly   Urinary: urethral meatus normal      Assessment:    Pregnancy: G1P0 Patient Active Problem List   Diagnosis Date Noted  . Supervision of normal first pregnancy, antepartum 12/17/2016  . IDA (iron deficiency anemia) 07/19/2015  . Menorrhagia with irregular cycle 07/19/2015  . Panic 05/31/2015  . Vaginitis 02/07/2015  . Irregular periods 11/12/2014  . Obesity (BMI 30-39.9) 05/17/2013  . Eczema 05/17/2013  . Surveillance for Depo-Provera contraception 05/17/2013  . Discoloration of skin 01/17/2013      Plan:   1. Supervision of normal first pregnancy, antepartum Genetic Screening discussed Quad Screen: requested.  Ultrasound discussed; fetal survey: requested.  Follow up in 4 weeks.  - Enroll patient in Babyscripts Program - Cytology - PAP - Obstetric Panel, Including HIV - Sickle Cell Scr - CULTURE, URINE COMPREHENSIVE - US MFM OB COMP + 14 WK; Future - HgB A1c - Glucose, Random  2. Obesity (BMI 30-39.9) Discussed appropriate wt gain - 10-15#.  HgA1c and random CBG.     Problem list reviewed and updated. 75% of 30 min visit spent on counseling and coordination of care.     Levie Heritage 12/17/2016

## 2016-12-18 LAB — OBSTETRIC PANEL, INCLUDING HIV
Antibody Screen: NEGATIVE
BASOS ABS: 0 10*3/uL (ref 0.0–0.2)
Basos: 0 %
EOS (ABSOLUTE): 0.1 10*3/uL (ref 0.0–0.4)
Eos: 1 %
HEP B S AG: NEGATIVE
HIV Screen 4th Generation wRfx: NONREACTIVE
Hematocrit: 37.4 % (ref 34.0–46.6)
Hemoglobin: 12.6 g/dL (ref 11.1–15.9)
IMMATURE GRANULOCYTES: 0 %
Immature Grans (Abs): 0 10*3/uL (ref 0.0–0.1)
LYMPHS ABS: 1.7 10*3/uL (ref 0.7–3.1)
Lymphs: 26 %
MCH: 29.6 pg (ref 26.6–33.0)
MCHC: 33.7 g/dL (ref 31.5–35.7)
MCV: 88 fL (ref 79–97)
MONOCYTES: 6 %
Monocytes Absolute: 0.4 10*3/uL (ref 0.1–0.9)
NEUTROS PCT: 67 %
Neutrophils Absolute: 4.2 10*3/uL (ref 1.4–7.0)
PLATELETS: 233 10*3/uL (ref 150–379)
RBC: 4.26 x10E6/uL (ref 3.77–5.28)
RDW: 14.5 % (ref 12.3–15.4)
RPR: NONREACTIVE
Rh Factor: POSITIVE
Rubella Antibodies, IGG: 2.72 index (ref >0.99)
WBC: 6.3 10*3/uL (ref 3.4–10.8)

## 2016-12-18 LAB — GLUCOSE, RANDOM: GLUCOSE: 96 mg/dL (ref 65–99)

## 2016-12-18 LAB — SICKLE CELL SCREEN: Sickle Cell Screen: POSITIVE — AB

## 2016-12-18 LAB — HEMOGLOBIN A1C
ESTIMATED AVERAGE GLUCOSE: 105 mg/dL
HEMOGLOBIN A1C: 5.3 % (ref 4.8–5.6)

## 2016-12-18 MED ORDER — PRENATAL GUMMIES/DHA & FA 0.4-32.5 MG PO CHEW: 1.0000 | CHEWABLE_TABLET | Freq: Every day | ORAL | 3 refills | Status: DC

## 2016-12-19 ENCOUNTER — Encounter: Payer: Self-pay | Admitting: Family

## 2016-12-20 LAB — CYTOLOGY - PAP
CHLAMYDIA, DNA PROBE: NEGATIVE
Neisseria Gonorrhea: NEGATIVE

## 2016-12-21 LAB — CULTURE, URINE COMPREHENSIVE

## 2016-12-24 ENCOUNTER — Encounter: Payer: Self-pay | Admitting: Family Medicine

## 2016-12-24 ENCOUNTER — Other Ambulatory Visit: Payer: Self-pay | Admitting: Family Medicine

## 2016-12-24 MED ORDER — METRONIDAZOLE 500 MG PO TABS: 500.0000 mg | ORAL_TABLET | Freq: Two times a day (BID) | ORAL | 0 refills | Status: DC

## 2017-01-15 ENCOUNTER — Other Ambulatory Visit (HOSPITAL_COMMUNITY)
Admission: RE | Admit: 2017-01-15 | Discharge: 2017-01-15 | Disposition: A | Discharge status: Home / Self Care | Payer: Medicaid Other | Source: Ambulatory Visit | Attending: Obstetrics & Gynecology | Admitting: Obstetrics & Gynecology

## 2017-01-15 ENCOUNTER — Ambulatory Visit (INDEPENDENT_AMBULATORY_CARE_PROVIDER_SITE_OTHER): Payer: Medicaid Other | Admitting: Obstetrics & Gynecology

## 2017-01-15 VITALS — BP 129/78 | HR 85 | Wt 223.0 lb

## 2017-01-15 DIAGNOSIS — Z3490 Encounter for supervision of normal pregnancy, unspecified, unspecified trimester: Secondary | ICD-10-CM | POA: Insufficient documentation

## 2017-01-15 DIAGNOSIS — Z331 Pregnant state, incidental: Secondary | ICD-10-CM | POA: Diagnosis not present

## 2017-01-15 DIAGNOSIS — R87612 Low grade squamous intraepithelial lesion on cytologic smear of cervix (LGSIL): Secondary | ICD-10-CM | POA: Diagnosis not present

## 2017-01-15 DIAGNOSIS — Z3400 Encounter for supervision of normal first pregnancy, unspecified trimester: Secondary | ICD-10-CM

## 2017-01-15 DIAGNOSIS — E669 Obesity, unspecified: Secondary | ICD-10-CM

## 2017-01-15 DIAGNOSIS — A599 Trichomoniasis, unspecified: Secondary | ICD-10-CM | POA: Insufficient documentation

## 2017-01-15 NOTE — Progress Notes (Signed)
   PRENATAL VISIT NOTE  Subjective:  Cheryl Mullen is a 26 y.o. S AA G1P0 at [redacted]w[redacted]d being seen today for ongoing prenatal care.  She is currently monitored for the following issues for this low-risk pregnancy and has Discoloration of skin; Obesity (BMI 30-39.9); Eczema; Surveillance for Depo-Provera contraception; Irregular periods; Vaginitis; Panic; IDA (iron deficiency anemia); Menorrhagia with irregular cycle; Supervision of normal first pregnancy, antepartum; Trichomoniasis; and LGSIL of cervix of undetermined significance on her problem list.  Patient reports no complaints.   . Vag. Bleeding: None.   . Denies leaking of fluid.   The following portions of the patient's history were reviewed and updated as appropriate: allergies, current medications, past family history, past medical history, past social history, past surgical history and problem list. Problem list updated.  Objective:   Vitals:   01/15/17 1009  BP: 129/78  Pulse: 85  Weight: 223 lb (101.2 kg)    Fetal Status:           General:  Alert, oriented and cooperative. Patient is in no acute distress.  Skin: Skin is warm and dry. No rash noted.   Cardiovascular: Normal heart rate noted  Respiratory: Normal respiratory effort, no problems with respiration noted  Abdomen: Soft, gravid, appropriate for gestational age.  Pain/Pressure: Present     Pelvic: Cervical exam deferred        Extremities: Normal range of motion.  Edema: None  Mental Status:  Normal mood and affect. Normal behavior. Normal judgment and thought content.  Consent signed, time out done Cervix prepped with acetic acid. Transformation zone seen in its entirety. Colpo adequate. Changes c/w LGSIL seen in a circumferencial fashion at the os (acetowhite changes). She tolerated the procedure well.   Assessment and Plan:  Pregnancy: G1P0 at [redacted]w[redacted]d  1. Prenatal care, antepartum  - Hemoglobinopathy evaluation  2. Obesity (BMI 30-39.9) - refer to  nutritionist  3. Supervision of normal first pregnancy, antepartum   4. Trichomoniasis - She and partner have been treated. Exam today suspicious for return of trich. I sent a wet prep  5. LGSIL of cervix of undetermined significance - colpo done today, c/w LGSIL  Preterm labor symptoms and general obstetric precautions including but not limited to vaginal bleeding, contractions, leaking of fluid and fetal movement were reviewed in detail with the patient. Please refer to After Visit Summary for other counseling recommendations.  No Follow-up on file.   Allie Bossier, MD

## 2017-01-19 LAB — CERVICOVAGINAL ANCILLARY ONLY
Chlamydia: NEGATIVE
NEISSERIA GONORRHEA: NEGATIVE
TRICH (WINDOWPATH): NEGATIVE

## 2017-01-19 LAB — HEMOGLOBINOPATHY EVALUATION
HEMOGLOBIN F QUANTITATION: 0 % (ref 0.0–2.0)
HGB A: 58 % — AB (ref 96.4–98.8)
HGB C: 0 %
HGB S: 38.2 % — ABNORMAL HIGH
HGB VARIANT: 0 %
Hemoglobin A2 Quantitation: 3.8 % — ABNORMAL HIGH (ref 1.8–3.2)

## 2017-01-20 ENCOUNTER — Encounter (HOSPITAL_COMMUNITY): Payer: Self-pay | Admitting: Family Medicine

## 2017-01-21 ENCOUNTER — Encounter: Payer: Self-pay | Admitting: Obstetrics & Gynecology

## 2017-01-21 DIAGNOSIS — Z862 Personal history of diseases of the blood and blood-forming organs and certain disorders involving the immune mechanism: Secondary | ICD-10-CM | POA: Insufficient documentation

## 2017-01-28 ENCOUNTER — Other Ambulatory Visit: Payer: Self-pay | Admitting: Family Medicine

## 2017-01-28 ENCOUNTER — Ambulatory Visit (HOSPITAL_COMMUNITY)
Admission: RE | Admit: 2017-01-28 | Discharge: 2017-01-28 | Disposition: A | Discharge status: Home / Self Care | Payer: Medicaid Other | Source: Ambulatory Visit | Attending: Family Medicine | Admitting: Family Medicine

## 2017-01-28 ENCOUNTER — Encounter: Payer: Medicaid Other | Attending: Obstetrics & Gynecology | Admitting: Registered"

## 2017-01-28 ENCOUNTER — Encounter: Payer: Self-pay | Admitting: Registered"

## 2017-01-28 DIAGNOSIS — E669 Obesity, unspecified: Secondary | ICD-10-CM | POA: Insufficient documentation

## 2017-01-28 DIAGNOSIS — Z3A00 Weeks of gestation of pregnancy not specified: Secondary | ICD-10-CM | POA: Diagnosis not present

## 2017-01-28 DIAGNOSIS — Z3A19 19 weeks gestation of pregnancy: Secondary | ICD-10-CM

## 2017-01-28 DIAGNOSIS — Z713 Dietary counseling and surveillance: Secondary | ICD-10-CM

## 2017-01-28 DIAGNOSIS — O99212 Obesity complicating pregnancy, second trimester: Secondary | ICD-10-CM | POA: Diagnosis not present

## 2017-01-28 DIAGNOSIS — Z363 Encounter for antenatal screening for malformations: Secondary | ICD-10-CM

## 2017-01-28 DIAGNOSIS — O99210 Obesity complicating pregnancy, unspecified trimester: Secondary | ICD-10-CM | POA: Insufficient documentation

## 2017-01-28 DIAGNOSIS — Z3400 Encounter for supervision of normal first pregnancy, unspecified trimester: Secondary | ICD-10-CM

## 2017-01-28 NOTE — Progress Notes (Signed)
Medical Nutrition Therapy:  Appt start time: 0830 end time:  0915.  Assessment:  Primary concerns today: Pt states her doctor recommended she come in for nutrition counseling. Pt states she has never been a breakfast person, but willing to start the day with at least a balanced snack.   Pt states most days she is tired and wants to stay in bed. Pt states she has not had vitamin D tested. Pt states that iron treatments helped with addressing fatigue, but still would like more energy.  Irregular periods prior to pregnancy and was on Depo for awhile but when she stopped it she didn't have a period for 6 months, then has 2x month, and then had one that lasted 45 days. Pt states she is on iron treatments, but since she stopped having periods the issue is resolving  Preferred Learning Style:   No preference indicated   Learning Readiness:   Ready  MEDICATIONS: reviewed   DIETARY INTAKE:  Usual eating pattern includes 2 meals and 1 snacks per day.  Everyday foods include fruit, milk- but doesn't like milk, but is okay with yogurt.  Avoided foods include pizza d/t heart burn, ham, spicy -baby doesn't like it, but she loves spicy food.    24-hr recall:  B ( AM): none (never has been a breakfast eater)  Snk ( AM): none L ( PM): regular yogurt, fruit, chicken, apple juice or lemonade Snk ( PM): chips something small D ( PM): meat, vegetable, starch Snk ( PM): none Beverages: water, apple juice, lemonade  Usual physical activity: daily walk 15 min  Estimated energy needs: 2000 calories 225 g carbohydrates 125 g protein 67 g fat  Progress Towards Goal(s):  In progress.   Nutritional Diagnosis:  NI-5.8.4 Inconsistent carbohydrate intake As related to skipping breakfast.  As evidenced by dietary recall.    Intervention:  Nutrition Education. Discussed balanced eating and needs during pregnancy. Discussed food safety during pregnancy. Discussed food sources for vitamins and minerals  important especially during pregnancy. Discussed role of fiber. Discussed healthy beverages. Discussed mindful eating.  Plan: Consider asking your doctor about testing Vitamin D levels. Use the handouts to make sure you are getting plenty of food sources of Iron, Folate, and Calcium. Aim to eat balanced meals and snacks, getting in lots of vegetables  Can eat seafood 2-3 x week, use handout for low mercury ideas. Consider replacing the juice at lunch with water.  Teaching Method Utilized:  Visual Auditory  Handouts given during visit include:  For Mom's: Eating for a Healthy Baby (https://www.[place].org/ChooseMyPlate.gov)  My Plate Planner  Carb snack sheet  Iron, folate, calcium food sources  Fish recommendations during pregnancy  Barriers to learning/adherence to lifestyle change: none  Demonstrated degree of understanding via:  Teach Back   Monitoring/Evaluation:  Dietary intake, exercise, and body weight in 3 week(s).

## 2017-01-28 NOTE — Patient Instructions (Addendum)
Consider asking your doctor about testing Vitamin D levels. Use the handouts to make sure you are getting plenty of food sources of Iron, Folate, and Calcium. Aim to eat balanced meals and snacks, getting in lots of vegetables  Can eat seafood 2-3 x week, use handout for low mercury ideas. Consider replacing the juice at lunch with water.

## 2017-02-18 ENCOUNTER — Encounter: Payer: Medicaid Other | Attending: Obstetrics & Gynecology | Admitting: Registered"

## 2017-02-18 DIAGNOSIS — E669 Obesity, unspecified: Secondary | ICD-10-CM | POA: Diagnosis not present

## 2017-02-18 DIAGNOSIS — Z3A00 Weeks of gestation of pregnancy not specified: Secondary | ICD-10-CM | POA: Insufficient documentation

## 2017-02-18 DIAGNOSIS — O99210 Obesity complicating pregnancy, unspecified trimester: Secondary | ICD-10-CM | POA: Diagnosis not present

## 2017-02-18 DIAGNOSIS — Z713 Dietary counseling and surveillance: Secondary | ICD-10-CM | POA: Insufficient documentation

## 2017-02-18 NOTE — Progress Notes (Signed)
Medical Nutrition Therapy:  Appt start time: 1538 end time:  1551.  Assessment:  Primary concerns today: Pt states she has started eating breakfast.  Pt states she has also started eating more spinach, drinking less juice and drinking mostly water.   Pt states she plans to ask doctor tomorrow about getting vitamin D tested. Pt states her has more energy.  Pt sates she is on the Babyscripts program and she is on target for weight gain.   Preferred Learning Style:   No preference indicated   Learning Readiness:   Ready  MEDICATIONS: reviewed   DIETARY INTAKE:  24-hr recall:  B ( AM): egg & fruit Snk ( AM): yogurt, fruit L ( PM): salad, chicken, mashed potatoes, water Snk ( PM): none D ( PM): burgers, toppings & bun Snk ( PM): none OR ice cream Beverages: water  Usual physical activity: daily walk 15 min, walks a lot at work  Estimated energy needs: 2000 calories 225 g carbohydrates 125 g protein 67 g fat  Progress Towards Goal(s):  In progress.   Nutritional Diagnosis:  NI-5.8.4 Inconsistent carbohydrate intake As related to skipping breakfast.  As evidenced by dietary recall.    Intervention:  Nutrition Education. Discussed balanced eating and needs during pregnancy. Discussed food safety during pregnancy. Discussed food sources for vitamins and minerals important especially during pregnancy. Discussed role of fiber. Discussed healthy beverages. Discussed mindful eating.  Teaching Method Utilized:  Visual Auditory  Handouts given during visit include:  none  Barriers to learning/adherence to lifestyle change: none  Demonstrated degree of understanding via:  Teach Back   Monitoring/Evaluation:  Dietary intake, exercise, and body weight prn.

## 2017-02-19 ENCOUNTER — Encounter: Payer: Self-pay | Admitting: Family Medicine

## 2017-02-19 ENCOUNTER — Ambulatory Visit (INDEPENDENT_AMBULATORY_CARE_PROVIDER_SITE_OTHER): Payer: Medicaid Other | Admitting: Family Medicine

## 2017-02-19 VITALS — BP 121/73 | HR 104 | Wt 226.0 lb

## 2017-02-19 DIAGNOSIS — Z3402 Encounter for supervision of normal first pregnancy, second trimester: Secondary | ICD-10-CM

## 2017-02-19 DIAGNOSIS — D573 Sickle-cell trait: Secondary | ICD-10-CM

## 2017-02-19 DIAGNOSIS — Z3400 Encounter for supervision of normal first pregnancy, unspecified trimester: Secondary | ICD-10-CM

## 2017-02-19 DIAGNOSIS — E669 Obesity, unspecified: Secondary | ICD-10-CM

## 2017-02-19 NOTE — Progress Notes (Signed)
   PRENATAL VISIT NOTE  Subjective:  Cheryl ColonelJamelia Malachi is a 26 y.o. G1P0 at 3551w1d being seen today for ongoing prenatal care.  She is currently monitored for the following issues for this low-risk pregnancy and has Discoloration of skin; Obesity (BMI 30-39.9); Eczema; Surveillance for Depo-Provera contraception; Irregular periods; Vaginitis; Panic; IDA (iron deficiency anemia); Menorrhagia with irregular cycle; Supervision of normal first pregnancy, antepartum; Trichomoniasis; LGSIL of cervix of undetermined significance; H/O hemoglobinopathy; and Sickle cell trait (HCC) on their problem list.  Patient reports no complaints.  Contractions: Not present. Vag. Bleeding: None.  Movement: Present. Denies leaking of fluid.   The following portions of the patient's history were reviewed and updated as appropriate: allergies, current medications, past family history, past medical history, past social history, past surgical history and problem list. Problem list updated.  Objective:   Vitals:   02/19/17 1057  BP: 121/73  Pulse: (!) 104  Weight: 226 lb (102.5 kg)    Fetal Status: Fetal Heart Rate (bpm): 155   Movement: Present     General:  Alert, oriented and cooperative. Patient is in no acute distress.  Skin: Skin is warm and dry. No rash noted.   Cardiovascular: Normal heart rate noted  Respiratory: Normal respiratory effort, no problems with respiration noted  Abdomen: Soft, gravid, appropriate for gestational age.  Pain/Pressure: Present     Pelvic: Cervical exam deferred        Extremities: Normal range of motion.     Mental Status:  Normal mood and affect. Normal behavior. Normal judgment and thought content.   Assessment and Plan:  Pregnancy: G1P0 at 5851w1d  1. Supervision of normal first pregnancy, antepartum FHT and FH normal - US MFM OB FOLLOW UP; Future - VITAMIN D 25 Hydroxy (Vit-D Deficiency, Fractures) - AFP TETRA  2. Obesity (BMI 30-39.9) Wt gain appropriate. Went to  nutrition  3. Sickle cell trait (HCC) Information for testing for FOB sent via MyChart.  Preterm labor symptoms and general obstetric precautions including but not limited to vaginal bleeding, contractions, leaking of fluid and fetal movement were reviewed in detail with the patient. Please refer to After Visit Summary for other counseling recommendations.  Return in about 6 weeks (around 04/02/2017) for OB f/u.   Levie HeritageJacob J Stinson, DO

## 2017-02-19 NOTE — Progress Notes (Signed)
Pt states she thinks she is feeling movement, describes some pain/kicks in lower side. Pt states that she was advised, by nutritionist, to have Vit D level checked. Discuss Flu vaccine in pregnancy.

## 2017-02-23 ENCOUNTER — Other Ambulatory Visit: Payer: Self-pay | Admitting: Family Medicine

## 2017-02-23 ENCOUNTER — Encounter: Payer: Self-pay | Admitting: Family Medicine

## 2017-02-23 LAB — AFP TETRA
DIA VALUE (EIA): 158.59 pg/mL
DSR (By Age)    1 IN: 954
GESTATIONAL AGE AFP: 22.1 wk
MATERNAL AGE AT EDD: 26.5 a
MSAFP Mom: 0.94
MSAFP: 54.7 ng/mL
MSHCG: 6304 m[IU]/mL
OSB RISK: 10000
T18 (By Age): 1:3718 {titer}
WEIGHT: 226 [lb_av]
uE3 Value: 1.84 ng/mL

## 2017-02-23 LAB — VITAMIN D 25 HYDROXY (VIT D DEFICIENCY, FRACTURES): VIT D 25 HYDROXY: 18.4 ng/mL — AB (ref 30.0–100.0)

## 2017-02-23 MED ORDER — VITAMIN D (ERGOCALCIFEROL) 1.25 MG (50000 UNIT) PO CAPS: 50000.0000 [IU] | ORAL_CAPSULE | ORAL | 0 refills | Status: DC

## 2017-03-10 ENCOUNTER — Ambulatory Visit (HOSPITAL_COMMUNITY): Payer: Medicaid Other

## 2017-03-10 ENCOUNTER — Ambulatory Visit (HOSPITAL_COMMUNITY)
Admission: RE | Admit: 2017-03-10 | Discharge: 2017-03-10 | Disposition: A | Discharge status: Home / Self Care | Payer: Medicaid Other | Source: Ambulatory Visit | Attending: Family Medicine | Admitting: Family Medicine

## 2017-03-10 DIAGNOSIS — Z362 Encounter for other antenatal screening follow-up: Secondary | ICD-10-CM | POA: Insufficient documentation

## 2017-03-10 DIAGNOSIS — Z3400 Encounter for supervision of normal first pregnancy, unspecified trimester: Secondary | ICD-10-CM

## 2017-03-10 DIAGNOSIS — O99212 Obesity complicating pregnancy, second trimester: Secondary | ICD-10-CM | POA: Diagnosis present

## 2017-03-10 DIAGNOSIS — Z3A24 24 weeks gestation of pregnancy: Secondary | ICD-10-CM | POA: Insufficient documentation

## 2017-04-02 ENCOUNTER — Ambulatory Visit (INDEPENDENT_AMBULATORY_CARE_PROVIDER_SITE_OTHER): Payer: Medicaid Other | Admitting: Obstetrics & Gynecology

## 2017-04-02 VITALS — BP 136/72 | HR 91 | Wt 233.0 lb

## 2017-04-02 DIAGNOSIS — Z23 Encounter for immunization: Secondary | ICD-10-CM

## 2017-04-02 DIAGNOSIS — E669 Obesity, unspecified: Secondary | ICD-10-CM

## 2017-04-02 DIAGNOSIS — Z3400 Encounter for supervision of normal first pregnancy, unspecified trimester: Secondary | ICD-10-CM

## 2017-04-02 DIAGNOSIS — O99213 Obesity complicating pregnancy, third trimester: Secondary | ICD-10-CM

## 2017-04-02 DIAGNOSIS — Z3490 Encounter for supervision of normal pregnancy, unspecified, unspecified trimester: Secondary | ICD-10-CM

## 2017-04-02 MED ORDER — TETANUS-DIPHTH-ACELL PERTUSSIS 5-2.5-18.5 LF-MCG/0.5 IM SUSP: 0.5000 mL | Freq: Once | INTRAMUSCULAR | Status: DC

## 2017-04-02 NOTE — Progress Notes (Signed)
   PRENATAL VISIT NOTE  Subjective:  Cheryl ColonelJamelia Malachi is a 26 y.o. G1P0 at 7108w1d being seen today for ongoing prenatal care.  She is currently monitored for the following issues for this low-risk pregnancy and has Discoloration of skin; Obesity (BMI 30-39.9); Eczema; Surveillance for Depo-Provera contraception; Irregular periods; Vaginitis; Panic; IDA (iron deficiency anemia); Menorrhagia with irregular cycle; Supervision of normal first pregnancy, antepartum; Trichomoniasis; LGSIL of cervix of undetermined significance; H/O hemoglobinopathy; and Sickle cell trait (HCC) on their problem list.  Patient reports no complaints.  Contractions: Not present. Vag. Bleeding: None.  Movement: Present. Denies leaking of fluid.   The following portions of the patient's history were reviewed and updated as appropriate: allergies, current medications, past family history, past medical history, past social history, past surgical history and problem list. Problem list updated.  Objective:   Vitals:   04/02/17 0813  BP: 136/72  Pulse: 91  Weight: 233 lb (105.7 kg)    Fetal Status:     Movement: Present     General:  Alert, oriented and cooperative. Patient is in no acute distress.  Skin: Skin is warm and dry. No rash noted.   Cardiovascular: Normal heart rate noted  Respiratory: Normal respiratory effort, no problems with respiration noted  Abdomen: Soft, gravid, appropriate for gestational age.  Pain/Pressure: Present     Pelvic: Cervical exam deferred        Extremities: Normal range of motion.  Edema: None  Mental Status:  Normal mood and affect. Normal behavior. Normal judgment and thought content.   Assessment and Plan:  Pregnancy: G1P0 at 76108w1d  1. Prenatal care, antepartum  - Glucose Tolerance, 2 Hours w/1 Hour - RPR - HIV antibody (with reflex) - CBC - TDAP  2. Obesity (BMI 30-39.9)   3. Supervision of normal first pregnancy, antepartum  - Flu Vaccine QUAD 36+ mos IM (Fluarix,  Quad PF)  Preterm labor symptoms and general obstetric precautions including but not limited to vaginal bleeding, contractions, leaking of fluid and fetal movement were reviewed in detail with the patient. Please refer to After Visit Summary for other counseling recommendations.  Return in about 3 weeks (around 04/23/2017).   Allie BossierMyra C Dazhane Villagomez, MD

## 2017-04-03 LAB — CBC
Hematocrit: 35.6 % (ref 34.0–46.6)
Hemoglobin: 12.1 g/dL (ref 11.1–15.9)
MCH: 29.7 pg (ref 26.6–33.0)
MCHC: 34 g/dL (ref 31.5–35.7)
MCV: 88 fL (ref 79–97)
PLATELETS: 253 10*3/uL (ref 150–379)
RBC: 4.07 x10E6/uL (ref 3.77–5.28)
RDW: 13.9 % (ref 12.3–15.4)
WBC: 8 10*3/uL (ref 3.4–10.8)

## 2017-04-03 LAB — RPR: RPR: NONREACTIVE

## 2017-04-03 LAB — GLUCOSE TOLERANCE, 2 HOURS W/ 1HR
GLUCOSE, 2 HOUR: 116 mg/dL (ref 65–152)
Glucose, 1 hour: 186 mg/dL — ABNORMAL HIGH (ref 65–179)
Glucose, Fasting: 96 mg/dL — ABNORMAL HIGH (ref 65–91)

## 2017-04-03 LAB — HIV ANTIBODY (ROUTINE TESTING W REFLEX): HIV SCREEN 4TH GENERATION: NONREACTIVE

## 2017-04-09 ENCOUNTER — Telehealth: Payer: Self-pay

## 2017-04-09 DIAGNOSIS — R7309 Other abnormal glucose: Secondary | ICD-10-CM

## 2017-04-09 NOTE — Telephone Encounter (Signed)
Patient called and made aware that her 2 hr gtt was abnormal and she has gestational diabetes. Patient made aware that she will need to see a diabetic counselor to have education on check her blood sugars and diet.  Patient made aware that if she has not heard from them with an appointment in the next week to call us back. Cheryl StammerJennifer Rilen Shukla RNBSN

## 2017-04-09 NOTE — Telephone Encounter (Signed)
-----   Message from Allie BossierMyra C Dove, MD sent at 04/08/2017  3:05 PM EST ----- Her 2 hour GTT was abnormal. She will need to see the diabetes counselor, get testing supplies. Thanks

## 2017-04-15 ENCOUNTER — Other Ambulatory Visit: Payer: Self-pay

## 2017-04-15 ENCOUNTER — Ambulatory Visit: Payer: Self-pay | Admitting: Family

## 2017-04-16 ENCOUNTER — Inpatient Hospital Stay (HOSPITAL_BASED_OUTPATIENT_CLINIC_OR_DEPARTMENT_OTHER): Payer: Medicaid Other | Admitting: Family

## 2017-04-16 ENCOUNTER — Other Ambulatory Visit: Payer: Self-pay

## 2017-04-16 ENCOUNTER — Encounter: Payer: Self-pay | Admitting: Family

## 2017-04-16 ENCOUNTER — Inpatient Hospital Stay: Payer: Medicaid Other | Attending: Hematology & Oncology

## 2017-04-16 ENCOUNTER — Encounter: Payer: Self-pay | Admitting: Obstetrics & Gynecology

## 2017-04-16 ENCOUNTER — Ambulatory Visit (INDEPENDENT_AMBULATORY_CARE_PROVIDER_SITE_OTHER): Payer: Medicaid Other | Admitting: Obstetrics & Gynecology

## 2017-04-16 VITALS — BP 135/65 | HR 97 | Temp 98.4°F | Resp 16 | Wt 229.0 lb

## 2017-04-16 VITALS — BP 123/72 | HR 87 | Wt 230.0 lb

## 2017-04-16 DIAGNOSIS — Z3A49 Greater than 42 weeks gestation of pregnancy: Secondary | ICD-10-CM | POA: Insufficient documentation

## 2017-04-16 DIAGNOSIS — D500 Iron deficiency anemia secondary to blood loss (chronic): Secondary | ICD-10-CM | POA: Insufficient documentation

## 2017-04-16 DIAGNOSIS — O99013 Anemia complicating pregnancy, third trimester: Secondary | ICD-10-CM | POA: Insufficient documentation

## 2017-04-16 DIAGNOSIS — R87612 Low grade squamous intraepithelial lesion on cytologic smear of cervix (LGSIL): Secondary | ICD-10-CM

## 2017-04-16 DIAGNOSIS — O24419 Gestational diabetes mellitus in pregnancy, unspecified control: Secondary | ICD-10-CM | POA: Insufficient documentation

## 2017-04-16 DIAGNOSIS — D573 Sickle-cell trait: Secondary | ICD-10-CM | POA: Insufficient documentation

## 2017-04-16 DIAGNOSIS — N920 Excessive and frequent menstruation with regular cycle: Secondary | ICD-10-CM

## 2017-04-16 DIAGNOSIS — Z3403 Encounter for supervision of normal first pregnancy, third trimester: Secondary | ICD-10-CM

## 2017-04-16 DIAGNOSIS — O24410 Gestational diabetes mellitus in pregnancy, diet controlled: Secondary | ICD-10-CM

## 2017-04-16 DIAGNOSIS — O99210 Obesity complicating pregnancy, unspecified trimester: Secondary | ICD-10-CM | POA: Insufficient documentation

## 2017-04-16 DIAGNOSIS — O99213 Obesity complicating pregnancy, third trimester: Secondary | ICD-10-CM

## 2017-04-16 DIAGNOSIS — Z3400 Encounter for supervision of normal first pregnancy, unspecified trimester: Secondary | ICD-10-CM

## 2017-04-16 LAB — CBC WITH DIFFERENTIAL (CANCER CENTER ONLY)
BASOS ABS: 0 10*3/uL (ref 0.0–0.1)
BASOS PCT: 0 %
Eosinophils Absolute: 0 10*3/uL (ref 0.0–0.5)
Eosinophils Relative: 1 %
HEMATOCRIT: 37.1 % (ref 34.8–46.6)
HEMOGLOBIN: 13 g/dL (ref 11.6–15.9)
Lymphocytes Relative: 20 %
Lymphs Abs: 1.5 10*3/uL (ref 0.9–3.3)
MCH: 30.3 pg (ref 26.0–34.0)
MCHC: 35 g/dL (ref 32.0–36.0)
MCV: 86.5 fL (ref 81.0–101.0)
Monocytes Absolute: 0.5 10*3/uL (ref 0.1–0.9)
Monocytes Relative: 6 %
NEUTROS PCT: 73 %
Neutro Abs: 5.8 10*3/uL (ref 1.5–6.5)
Platelet Count: 256 10*3/uL (ref 145–400)
RBC: 4.29 MIL/uL (ref 3.70–5.32)
RDW: 13.1 % (ref 11.1–15.7)
WBC: 7.8 10*3/uL (ref 3.9–10.3)

## 2017-04-16 LAB — RETICULOCYTES
RBC.: 4.31 MIL/uL (ref 3.70–5.45)
RETIC CT PCT: 3.1 % — AB (ref 0.7–2.1)
Retic Count, Absolute: 133.6 10*3/uL — ABNORMAL HIGH (ref 33.7–90.7)

## 2017-04-16 LAB — IRON AND TIBC
Iron: 98 ug/dL (ref 41–142)
SATURATION RATIOS: 25 % (ref 21–57)
TIBC: 398 ug/dL (ref 236–444)
UIBC: 300 ug/dL

## 2017-04-16 LAB — FERRITIN: Ferritin: 37 ng/mL (ref 9–269)

## 2017-04-16 NOTE — Patient Instructions (Signed)

## 2017-04-16 NOTE — Progress Notes (Signed)
   PRENATAL VISIT NOTE  Subjective:  Cheryl Mullen is a 27 y.o. G1P0 at 6946w1d being seen today for ongoing prenatal care.  She is currently monitored for the following issues for this high-risk pregnancy and has Discoloration of skin; Obesity (BMI 30-39.9); Eczema; Panic; IDA (iron deficiency anemia); Menorrhagia with irregular cycle; Supervision of normal first pregnancy, antepartum; Trichomoniasis; LGSIL of cervix of undetermined significance; H/O hemoglobinopathy; Sickle cell trait (HCC); Maternal obesity, antepartum; and Gestational diabetes on their problem list.  Patient reports no complaints.  Contractions: Not present. Vag. Bleeding: None.  Movement: Present. Denies leaking of fluid.   The following portions of the patient's history were reviewed and updated as appropriate: allergies, current medications, past family history, past medical history, past social history, past surgical history and problem list. Problem list updated.  Objective:   Vitals:   04/16/17 1001  BP: 123/72  Pulse: 87  Weight: 230 lb (104.3 kg)    Fetal Status: Fetal Heart Rate (bpm): 150   Movement: Present     General:  Alert, oriented and cooperative. Patient is in no acute distress.  Skin: Skin is warm and dry. No rash noted.   Cardiovascular: Normal heart rate noted  Respiratory: Normal respiratory effort, no problems with respiration noted  Abdomen: Soft, gravid, appropriate for gestational age.  Pain/Pressure: Present     Pelvic: Cervical exam deferred        Extremities: Normal range of motion.  Edema: None  Mental Status:  Normal mood and affect. Normal behavior. Normal judgment and thought content.   Assessment and Plan:  Pregnancy: G1P0 at 5346w1d  1. Supervision of normal first pregnancy, antepartum Pt failed her 2 hour GTT.  Prev saw dietician but, did not feel that it was helpful.   2. Sickle cell trait (HCC)  3. LGSIL of cervix of undetermined significance S/p colpo  4. Maternal  obesity, antepartum Reviewed diet. Pt is eating a lot of sugar.   5.  Gestational DM  Scheduled to see diabetic counselor and nutritionist  Reviewed risks of above   Pt instructed to bring Glucose log to next visit.      Preterm labor symptoms and general obstetric precautions including but not limited to vaginal bleeding, contractions, leaking of fluid and fetal movement were reviewed in detail with the patient. Please refer to After Visit Summary for other counseling recommendations.  F/u 2 weeks.  Willodean Rosenthalarolyn Harraway-Smith, MD

## 2017-04-16 NOTE — Progress Notes (Signed)
Hematology and Oncology Follow Up Visit  Cheryl Mullen [id number] Feb 14, 1991 27 y.o. 04/16/2017   Principle Diagnosis:  Iron deficiency anemia secondary to menorrhagia  3rd trimester of pregnancy   Current Therapy:   IV iron as indicated - last received in June 2017    Interim History:  Cheryl Mullen is here today with her mother for follow-up. She is 7 months pregnant! She is due on March 21st.  She has some mild fatigue at times but is otherwise doing quite well.  No bleeding, bruising or petechiae. No lymphadenopathy.  No fever, chills, n/v, cough, rash, dizziness, SOB, chest pain, palpitations, abdominal pain or changes in bowel or bladder habits.  No swelling, tenderness, numbness or tingling in his extremities. No c/o pain.  She has a good appetite and is staying well hydrated. She was diagnosed with gestational diabetes last week. Her weight is stable.   ECOG Performance Status: 1 - Symptomatic but completely ambulatory  Medications:  Allergies as of 04/16/2017   No Known Allergies     Medication List        Accurate as of 04/16/17  9:31 AM. Always use your most recent med list.          metroNIDAZOLE 500 MG tablet Commonly known as:  FLAGYL Take 1 tablet (500 mg total) by mouth 2 (two) times daily.   montelukast 10 MG tablet Commonly known as:  SINGULAIR Take 1 tablet (10 mg total) by mouth at bedtime.   PRENATAL 27-1 MG Tabs 1 po qd   PRENATAL GUMMIES/DHA & FA 0.4-32.5 MG Chew Chew 1 tablet by mouth daily.   triamcinolone ointment 0.1 % Commonly known as:  KENALOG Apply 1 application topically 2 (two) times daily. Needs appointment for further refills.   Vitamin D (Ergocalciferol) 50000 units Caps capsule Commonly known as:  DRISDOL Take 1 capsule (50,000 Units total) by mouth every 7 (seven) days.       Allergies: No Known Allergies  Past Medical History, Surgical history, Social history, and Family History were reviewed and updated.  Review of  Systems: All other 10 point review of systems is negative.   Physical Exam:  vitals were not taken for this visit.   Wt Readings from Last 3 Encounters:  04/02/17 233 lb (105.7 kg)  02/19/17 226 lb (102.5 kg)  01/15/17 223 lb (101.2 kg)    Ocular: Sclerae unicteric, pupils equal, round and reactive to light Ear-nose-throat: Oropharynx clear, dentition fair Lymphatic: No cervical, supraclavicular or axillary adenopathy Lungs no rales or rhonchi, good excursion bilaterally Heart regular rate and rhythm, no murmur appreciated Abd soft, nontender, positive bowel sounds, no liver or spleen tip palpated on exam, no fluid wave  MSK no focal spinal tenderness, no joint edema Neuro: non-focal, well-oriented, appropriate affect Breasts: Deferred   Lab Results  Component Value Date   WBC 8.0 04/02/2017   HGB 12.1 04/02/2017   HCT 37.1 04/16/2017   MCV 86.5 04/16/2017   PLT 253 04/02/2017   Lab Results  Component Value Date   FERRITIN 108 10/14/2016   IRON 54 10/14/2016   TIBC 250 10/14/2016   UIBC 196 10/14/2016   IRONPCTSAT 21 10/14/2016   Lab Results  Component Value Date   RBC 4.29 04/16/2017   No results found for: KPAFRELGTCHN, LAMBDASER, KAPLAMBRATIO No results found for: IGGSERUM, IGA, IGMSERUM No results found for: TOTALPROTELP, ALBUMINELP, A1GS, A2GS, BETS, BETA2SER, GAMS, MSPIKE, SPEI   Chemistry      Component Value Date/Time   NA  137 07/03/2015 1647   K 4.1 07/03/2015 1647   CL 104 07/03/2015 1647   CO2 25 07/03/2015 1647   BUN 9 07/03/2015 1647   CREATININE 0.93 07/03/2015 1647      Component Value Date/Time   CALCIUM 9.1 07/03/2015 1647   ALKPHOS 75 07/03/2015 1647   AST 17 07/03/2015 1647   ALT 16 07/03/2015 1647   BILITOT 0.3 07/03/2015 1647      Impression and Plan: Ms. Cheryl Mullen is a very pleasant 27 yo African American female with iron deficiency anemia secondary to heavy cycles. She is now pregnant and in her 3rd trimester. She is doing quite  well but was diagnosed with gestational diabetes.  We will see what her iron studies show and [facility].  We will go ahead and plan to see her back in another 6 months for follow-up and repeat lab.  She will contact our office with any questions or concerns. We can certainly see her sooner if need be.   Emeline GinsSarah Amaree Loisel, NP 1/11/20199:31 AM

## 2017-04-23 ENCOUNTER — Encounter: Payer: Self-pay | Admitting: *Deleted

## 2017-04-23 ENCOUNTER — Encounter: Payer: Self-pay | Admitting: Family Medicine

## 2017-04-23 ENCOUNTER — Encounter: Payer: Medicaid Other | Attending: Obstetrics & Gynecology | Admitting: *Deleted

## 2017-04-23 ENCOUNTER — Other Ambulatory Visit: Payer: Self-pay | Admitting: *Deleted

## 2017-04-23 DIAGNOSIS — Z713 Dietary counseling and surveillance: Secondary | ICD-10-CM | POA: Insufficient documentation

## 2017-04-23 DIAGNOSIS — O99210 Obesity complicating pregnancy, unspecified trimester: Secondary | ICD-10-CM | POA: Insufficient documentation

## 2017-04-23 DIAGNOSIS — O24419 Gestational diabetes mellitus in pregnancy, unspecified control: Secondary | ICD-10-CM

## 2017-04-23 DIAGNOSIS — O24410 Gestational diabetes mellitus in pregnancy, diet controlled: Secondary | ICD-10-CM

## 2017-04-23 DIAGNOSIS — Z3A00 Weeks of gestation of pregnancy not specified: Secondary | ICD-10-CM | POA: Diagnosis not present

## 2017-04-23 DIAGNOSIS — E669 Obesity, unspecified: Secondary | ICD-10-CM | POA: Diagnosis not present

## 2017-04-23 MED ORDER — ACCU-CHEK GUIDE W/DEVICE KIT: 1.0000 | PACK | Freq: Once | 0 refills | Status: AC

## 2017-04-23 MED ORDER — GLUCOSE BLOOD VI STRP: ORAL_STRIP | 5 refills | Status: DC

## 2017-04-23 MED ORDER — ACCU-CHEK FASTCLIX LANCETS MISC: 1.0000 | Freq: Four times a day (QID) | 5 refills | Status: DC

## 2017-04-23 NOTE — Progress Notes (Signed)
Diabetic testing supplies sent to pharmacy

## 2017-04-27 NOTE — Progress Notes (Signed)
  Patient was seen on 04/23/2017 for Gestational Diabetes self-management. Patient states no history of GDM, EDD 06/24/2017. She states she works at [employer]. Diet history obtained, fairly good variety of all food groups. She does consume some high sugar beverages which I plan to address in education portion of our visit.  The following learning objectives were met by the patient :   States the definition of Gestational Diabetes  States why dietary management is important in controlling blood glucose  Describes the effects of carbohydrates on blood glucose levels  Demonstrates ability to create a balanced meal plan  Demonstrates carbohydrate counting   States when to check blood glucose levels  Demonstrates proper blood glucose monitoring techniques  States the effect of stress and exercise on blood glucose levels  States the importance of limiting caffeine and abstaining from alcohol and smoking  Plan:  Aim for 3 Carb Choices per meal (45 grams) +/- 1 either way  Aim for 1-2 Carbs per snack Begin reading food labels for Total Carbohydrate of foods Consider  increasing your activity level by walking or other activity daily as tolerated Begin checking BG before breakfast and 2 hours after first bite of breakfast, lunch and dinner as directed by MD  Bring Log Book to every medical appointment   Take medication if directed by MD  Blood glucose monitor Rx called into pharmacy by MD office, but has not been picked up yet. : Accu Check Guide with Fast Clix drums. I instructed patient on use of meter and lancing device. Patient instructed to test pre breakfast and 2 hours each meal as directed by MD  Patient instructed to monitor glucose levels: FBS: 60 - 95 mg/dl 2 hour: <120 mg/dl  Patient received the following handouts:  Nutrition Diabetes and Pregnancy  Carbohydrate Counting List  Patient will be seen for follow-up as needed.

## 2017-04-28 ENCOUNTER — Encounter: Payer: Self-pay | Admitting: Family Medicine

## 2017-04-30 ENCOUNTER — Encounter: Payer: Self-pay | Admitting: Family Medicine

## 2017-04-30 ENCOUNTER — Ambulatory Visit (INDEPENDENT_AMBULATORY_CARE_PROVIDER_SITE_OTHER): Payer: Medicaid Other | Admitting: Family Medicine

## 2017-04-30 VITALS — BP 132/75 | HR 107 | Wt 231.0 lb

## 2017-04-30 DIAGNOSIS — D573 Sickle-cell trait: Secondary | ICD-10-CM

## 2017-04-30 DIAGNOSIS — Z3400 Encounter for supervision of normal first pregnancy, unspecified trimester: Secondary | ICD-10-CM

## 2017-04-30 DIAGNOSIS — R87612 Low grade squamous intraepithelial lesion on cytologic smear of cervix (LGSIL): Secondary | ICD-10-CM

## 2017-04-30 DIAGNOSIS — E669 Obesity, unspecified: Secondary | ICD-10-CM

## 2017-04-30 DIAGNOSIS — O99210 Obesity complicating pregnancy, unspecified trimester: Secondary | ICD-10-CM

## 2017-04-30 DIAGNOSIS — O24410 Gestational diabetes mellitus in pregnancy, diet controlled: Secondary | ICD-10-CM

## 2017-04-30 NOTE — Progress Notes (Signed)
   PRENATAL VISIT NOTE  Subjective:  Cheryl ColonelJamelia Malachi is a 27 y.o. G1P0 at 5815w1d being seen today for ongoing prenatal care.  She is currently monitored for the following issues for this high-risk pregnancy and has Discoloration of skin; Obesity (BMI 30-39.9); Eczema; Panic; IDA (iron deficiency anemia); Menorrhagia with irregular cycle; Supervision of normal first pregnancy, antepartum; Trichomoniasis; LGSIL of cervix of undetermined significance; H/O hemoglobinopathy; Sickle cell trait (HCC); Maternal obesity, antepartum; and Gestational diabetes on their problem list.  Patient reports sinus congestion.  Contractions: Not present. Vag. Bleeding: None.  Movement: Present. Denies leaking of fluid.   The following portions of the patient's history were reviewed and updated as appropriate: allergies, current medications, past family history, past medical history, past social history, past surgical history and problem list. Problem list updated.  Objective:   Vitals:   04/30/17 1014  BP: 132/75  Pulse: (!) 107  Weight: 231 lb (104.8 kg)    Fetal Status:     Movement: Present     General:  Alert, oriented and cooperative. Patient is in no acute distress.  Skin: Skin is warm and dry. No rash noted.   Cardiovascular: Normal heart rate noted  Respiratory: Normal respiratory effort, no problems with respiration noted  Abdomen: Soft, gravid, appropriate for gestational age.  Pain/Pressure: Present     Pelvic: Cervical exam deferred        Extremities: Normal range of motion.  Edema: None  Mental Status:  Normal mood and affect. Normal behavior. Normal judgment and thought content.   Assessment and Plan:  Pregnancy: G1P0 at 7115w1d  1. Supervision of normal first pregnancy, antepartum FHT and FH normal  2. Sickle cell trait (HCC)  3. LGSIL of cervix of undetermined significance  4. Diet controlled gestational diabetes mellitus (GDM) in third trimester Patient testing for past 5 days and  not testing after dinner. Patient to move CBGs to 1hr PP. PP mostly controlled. Fasting 92-95. Will see back in two weeks  5. Obesity (BMI 30-39.9)   Preterm labor symptoms and general obstetric precautions including but not limited to vaginal bleeding, contractions, leaking of fluid and fetal movement were reviewed in detail with the patient. Please refer to After Visit Summary for other counseling recommendations.  No Follow-up on file.   Levie HeritageJacob J Moni Rothrock, DO

## 2017-05-14 ENCOUNTER — Ambulatory Visit (INDEPENDENT_AMBULATORY_CARE_PROVIDER_SITE_OTHER): Payer: Medicaid Other | Admitting: Family Medicine

## 2017-05-14 VITALS — BP 126/78 | HR 98 | Wt 232.0 lb

## 2017-05-14 DIAGNOSIS — Z3400 Encounter for supervision of normal first pregnancy, unspecified trimester: Secondary | ICD-10-CM

## 2017-05-14 DIAGNOSIS — O24410 Gestational diabetes mellitus in pregnancy, diet controlled: Secondary | ICD-10-CM

## 2017-05-14 DIAGNOSIS — E669 Obesity, unspecified: Secondary | ICD-10-CM

## 2017-05-14 DIAGNOSIS — R87612 Low grade squamous intraepithelial lesion on cytologic smear of cervix (LGSIL): Secondary | ICD-10-CM

## 2017-05-14 DIAGNOSIS — D573 Sickle-cell trait: Secondary | ICD-10-CM

## 2017-05-14 MED ORDER — GLYBURIDE 2.5 MG PO TABS: 2.5000 mg | ORAL_TABLET | Freq: Every day | ORAL | 3 refills | Status: DC

## 2017-05-14 MED ORDER — DOXYLAMINE SUCCINATE (SLEEP) 25 MG PO TABS: 25.0000 mg | ORAL_TABLET | Freq: Every evening | ORAL | 2 refills | Status: DC | PRN

## 2017-05-14 NOTE — Progress Notes (Signed)
Subjective:  Cheryl Mullen is a 27 y.o. G1P0 at 532w1d being seen today for ongoing prenatal care.  She is currently monitored for the following issues for this high-risk pregnancy and has Discoloration of skin; Obesity (BMI 30-39.9); Eczema; Panic; IDA (iron deficiency anemia); Menorrhagia with irregular cycle; Supervision of normal first pregnancy, antepartum; Trichomoniasis; LGSIL of cervix of undetermined significance; H/O hemoglobinopathy; Sickle cell trait (HCC); Maternal obesity, antepartum; and Gestational diabetes on their problem list.  GDM: Patient diet controlled.  Reports no hypoglycemic episodes. Checking 1hr after eating Fasting: 102-115 2hr PP: 92-135. No checks after dinner - falls asleep on couch.  Patient reports vomiting.  Contractions: Not present. Vag. Bleeding: None.  Movement: Present. Denies leaking of fluid.   The following portions of the patient's history were reviewed and updated as appropriate: allergies, current medications, past family history, past medical history, past social history, past surgical history and problem list. Problem list updated.  Objective:   Vitals:   05/14/17 1047  BP: 126/78  Pulse: 98  Weight: 232 lb (105.2 kg)    Fetal Status: Fetal Heart Rate (bpm): 156   Movement: Present     General:  Alert, oriented and cooperative. Patient is in no acute distress.  Skin: Skin is warm and dry. No rash noted.   Cardiovascular: Normal heart rate noted  Respiratory: Normal respiratory effort, no problems with respiration noted  Abdomen: Soft, gravid, appropriate for gestational age. Pain/Pressure: Present     Pelvic: Vag. Bleeding: None     Cervical exam deferred        Extremities: Normal range of motion.  Edema: None  Mental Status: Normal mood and affect. Normal behavior. Normal judgment and thought content.   Urinalysis:      Assessment and Plan:  Pregnancy: G1P0 at 6732w1d  1. Supervision of normal first pregnancy, antepartum FHT and  FH normal. Doxylamine for vomiting  2. Sickle cell trait (HCC)   3. Diet controlled gestational diabetes mellitus (GDM) in third trimester Start glyburide at night. Advised pt to start testing after dinner Start antenatal testing  4. Obesity (BMI 30-39.9)  5. LGSIL of cervix of undetermined significance   Preterm labor symptoms and general obstetric precautions including but not limited to vaginal bleeding, contractions, leaking of fluid and fetal movement were reviewed in detail with the patient. Please refer to After Visit Summary for other counseling recommendations.  No Follow-up on file.   Levie HeritageStinson, Nathyn Luiz J, DO

## 2017-05-18 ENCOUNTER — Ambulatory Visit (INDEPENDENT_AMBULATORY_CARE_PROVIDER_SITE_OTHER): Payer: Medicaid Other

## 2017-05-18 VITALS — BP 128/80 | HR 98

## 2017-05-18 DIAGNOSIS — O24415 Gestational diabetes mellitus in pregnancy, controlled by oral hypoglycemic drugs: Secondary | ICD-10-CM | POA: Diagnosis not present

## 2017-05-18 NOTE — Progress Notes (Signed)
NST only visit. Jennifer Howard RN 

## 2017-05-21 ENCOUNTER — Ambulatory Visit (INDEPENDENT_AMBULATORY_CARE_PROVIDER_SITE_OTHER): Payer: Medicaid Other | Admitting: Family Medicine

## 2017-05-21 VITALS — BP 128/81 | HR 93 | Wt 231.0 lb

## 2017-05-21 DIAGNOSIS — E669 Obesity, unspecified: Secondary | ICD-10-CM

## 2017-05-21 DIAGNOSIS — R87612 Low grade squamous intraepithelial lesion on cytologic smear of cervix (LGSIL): Secondary | ICD-10-CM

## 2017-05-21 DIAGNOSIS — D573 Sickle-cell trait: Secondary | ICD-10-CM

## 2017-05-21 DIAGNOSIS — Z3400 Encounter for supervision of normal first pregnancy, unspecified trimester: Secondary | ICD-10-CM

## 2017-05-21 DIAGNOSIS — O24415 Gestational diabetes mellitus in pregnancy, controlled by oral hypoglycemic drugs: Secondary | ICD-10-CM

## 2017-05-21 NOTE — Progress Notes (Signed)
Subjective:  Cheryl Mullen is a 27 y.o. G1P0 at 4420w1d being seen today for ongoing prenatal care.  She is currently monitored for the following issues for this high-risk pregnancy and has Discoloration of skin; Obesity (BMI 30-39.9); Eczema; Panic; IDA (iron deficiency anemia); Menorrhagia with irregular cycle; Supervision of normal first pregnancy, antepartum; Trichomoniasis; LGSIL of cervix of undetermined significance; H/O hemoglobinopathy; Sickle cell trait (HCC); Maternal obesity, antepartum; and Gestational diabetes on their problem list.  GDM: Patient taking glyburide 2.5mg  at bedtime.  Reports no hypoglycemic episodes.  Tolerating medication well Fasting: 85-95 2hr PP: 79-132  Patient reports no complaints.  Contractions: Not present. Vag. Bleeding: None.  Movement: Present. Denies leaking of fluid.   The following portions of the patient's history were reviewed and updated as appropriate: allergies, current medications, past family history, past medical history, past social history, past surgical history and problem list. Problem list updated.  Objective:   Vitals:   05/21/17 1027  BP: 128/81  Pulse: 93  Weight: 231 lb (104.8 kg)    Fetal Status: Fetal Heart Rate (bpm): NST   Movement: Present     General:  Alert, oriented and cooperative. Patient is in no acute distress.  Skin: Skin is warm and dry. No rash noted.   Cardiovascular: Normal heart rate noted  Respiratory: Normal respiratory effort, no problems with respiration noted  Abdomen: Soft, gravid, appropriate for gestational age. Pain/Pressure: Present     Pelvic: Vag. Bleeding: None     Cervical exam deferred        Extremities: Normal range of motion.     Mental Status: Normal mood and affect. Normal behavior. Normal judgment and thought content.   Urinalysis:      Assessment and Plan:  Pregnancy: G1P0 at 8220w1d  1. Supervision of normal first pregnancy, antepartum FHT and FH normal  2. Gestational diabetes  mellitus (GDM) in third trimester controlled on oral hypoglycemic drug NST reactive Continue antenatal testing Continue glyburide   3. Sickle cell trait (HCC)  4. Obesity (BMI 30-39.9) Weight gain normal  5. LGSIL of cervix of undetermined significance PP colposcopy  Preterm labor symptoms and general obstetric precautions including but not limited to vaginal bleeding, contractions, leaking of fluid and fetal movement were reviewed in detail with the patient. Please refer to After Visit Summary for other counseling recommendations.  No Follow-up on file.   Levie HeritageStinson, Wilmore Holsomback J, DO

## 2017-05-24 ENCOUNTER — Other Ambulatory Visit: Payer: Medicaid Other

## 2017-05-24 ENCOUNTER — Encounter: Payer: Self-pay | Admitting: Family Medicine

## 2017-05-25 ENCOUNTER — Other Ambulatory Visit (HOSPITAL_COMMUNITY)
Admission: RE | Admit: 2017-05-25 | Discharge: 2017-05-25 | Disposition: A | Discharge status: Home / Self Care | Payer: Medicaid Other | Source: Ambulatory Visit | Attending: Advanced Practice Midwife | Admitting: Advanced Practice Midwife

## 2017-05-25 ENCOUNTER — Ambulatory Visit (INDEPENDENT_AMBULATORY_CARE_PROVIDER_SITE_OTHER): Payer: Medicaid Other

## 2017-05-25 DIAGNOSIS — O24415 Gestational diabetes mellitus in pregnancy, controlled by oral hypoglycemic drugs: Secondary | ICD-10-CM | POA: Diagnosis not present

## 2017-05-25 DIAGNOSIS — B373 Candidiasis of vulva and vagina: Secondary | ICD-10-CM | POA: Insufficient documentation

## 2017-05-25 DIAGNOSIS — O98819 Other maternal infectious and parasitic diseases complicating pregnancy, unspecified trimester: Secondary | ICD-10-CM | POA: Insufficient documentation

## 2017-05-25 DIAGNOSIS — O26899 Other specified pregnancy related conditions, unspecified trimester: Secondary | ICD-10-CM | POA: Diagnosis not present

## 2017-05-25 DIAGNOSIS — N898 Other specified noninflammatory disorders of vagina: Secondary | ICD-10-CM | POA: Insufficient documentation

## 2017-05-25 DIAGNOSIS — Z3490 Encounter for supervision of normal pregnancy, unspecified, unspecified trimester: Secondary | ICD-10-CM | POA: Diagnosis present

## 2017-05-25 DIAGNOSIS — Z3A00 Weeks of gestation of pregnancy not specified: Secondary | ICD-10-CM | POA: Insufficient documentation

## 2017-05-25 DIAGNOSIS — N949 Unspecified condition associated with female genital organs and menstrual cycle: Secondary | ICD-10-CM

## 2017-05-25 MED ORDER — TERCONAZOLE 0.4 % VA CREA: 1.0000 | TOPICAL_CREAM | Freq: Every day | VAGINAL | 1 refills | Status: DC

## 2017-05-25 NOTE — Progress Notes (Signed)
NST only visit.  Patient is complaining of vaginal burning and irritation. Patient given self swab and urine collected for urine culture.

## 2017-05-26 LAB — CERVICOVAGINAL ANCILLARY ONLY
BACTERIAL VAGINITIS: NEGATIVE
Candida vaginitis: POSITIVE — AB

## 2017-05-27 LAB — URINE CULTURE

## 2017-05-28 ENCOUNTER — Ambulatory Visit (INDEPENDENT_AMBULATORY_CARE_PROVIDER_SITE_OTHER): Payer: Medicaid Other | Admitting: Family Medicine

## 2017-05-28 VITALS — BP 132/78 | HR 107 | Wt 235.0 lb

## 2017-05-28 DIAGNOSIS — O99013 Anemia complicating pregnancy, third trimester: Secondary | ICD-10-CM

## 2017-05-28 DIAGNOSIS — Z3400 Encounter for supervision of normal first pregnancy, unspecified trimester: Secondary | ICD-10-CM

## 2017-05-28 DIAGNOSIS — O99213 Obesity complicating pregnancy, third trimester: Secondary | ICD-10-CM

## 2017-05-28 DIAGNOSIS — D573 Sickle-cell trait: Secondary | ICD-10-CM

## 2017-05-28 DIAGNOSIS — E669 Obesity, unspecified: Secondary | ICD-10-CM

## 2017-05-28 DIAGNOSIS — O24415 Gestational diabetes mellitus in pregnancy, controlled by oral hypoglycemic drugs: Secondary | ICD-10-CM

## 2017-05-28 NOTE — Progress Notes (Signed)
Subjective:  Cheryl ColonelJamelia Malachi is a 27 y.o. G1P0 at 5933w1d being seen today for ongoing prenatal care.  She is currently monitored for the following issues for this high-risk pregnancy and has Discoloration of skin; Obesity (BMI 30-39.9); Eczema; Panic; IDA (iron deficiency anemia); Menorrhagia with irregular cycle; Supervision of normal first pregnancy, antepartum; Trichomoniasis; LGSIL of cervix of undetermined significance; H/O hemoglobinopathy; Sickle cell trait (HCC); Maternal obesity, antepartum; and Gestational diabetes on their problem list.  GDM: Patient taking glyburide 2.5mg .  Reports no hypoglycemic episodes.  Tolerating medication well Fasting: 86-92 1hr PP: controlled. No checking after dinner still  Patient reports no complaints.  Contractions: Irregular. Vag. Bleeding: None.  Movement: Present. Denies leaking of fluid.   The following portions of the patient's history were reviewed and updated as appropriate: allergies, current medications, past family history, past medical history, past social history, past surgical history and problem list. Problem list updated.  Objective:   Vitals:   05/28/17 1029  BP: 132/78  Pulse: (!) 107  Weight: 235 lb (106.6 kg)    Fetal Status: Fetal Heart Rate (bpm): NST   Movement: Present     General:  Alert, oriented and cooperative. Patient is in no acute distress.  Skin: Skin is warm and dry. No rash noted.   Cardiovascular: Normal heart rate noted  Respiratory: Normal respiratory effort, no problems with respiration noted  Abdomen: Soft, gravid, appropriate for gestational age. Pain/Pressure: Present     Pelvic: Vag. Bleeding: None     Cervical exam deferred        Extremities: Normal range of motion.     Mental Status: Normal mood and affect. Normal behavior. Normal judgment and thought content.   Urinalysis:      Assessment and Plan:  Pregnancy: G1P0 at 3733w1d  1. Supervision of normal first pregnancy, antepartum FHT and FH  normal  2. Sickle cell trait (HCC)  3. Obesity (BMI 30-39.9)  4. Gestational diabetes mellitus (GDM) in third trimester controlled on oral hypoglycemic drug Pt to check after dinner.  US for growth in 2 weeks NST reactive - US MFM OB FOLLOW UP; Future - US MFM FETAL BPP WO NON STRESS; Future  Preterm labor symptoms and general obstetric precautions including but not limited to vaginal bleeding, contractions, leaking of fluid and fetal movement were reviewed in detail with the patient. Please refer to After Visit Summary for other counseling recommendations.  No Follow-up on file.   Levie HeritageStinson, Alvilda Mckenna J, DO

## 2017-05-30 LAB — STREP GP B NAA: Strep Gp B NAA: NEGATIVE

## 2017-06-01 ENCOUNTER — Ambulatory Visit (INDEPENDENT_AMBULATORY_CARE_PROVIDER_SITE_OTHER): Payer: Medicaid Other

## 2017-06-01 DIAGNOSIS — O24415 Gestational diabetes mellitus in pregnancy, controlled by oral hypoglycemic drugs: Secondary | ICD-10-CM | POA: Diagnosis not present

## 2017-06-01 NOTE — Addendum Note (Signed)
Addended by: Aviva SignsWILLIAMS, Braxon Suder L on: 06/01/2017 10:25 AM   Modules accepted: Level of Service

## 2017-06-01 NOTE — Patient Instructions (Signed)

## 2017-06-01 NOTE — Progress Notes (Signed)
I reviewed this NST on this patient  Fetal heart rate pattern was reactive, Category I There was good baseline variability and multiple accelerations There were no decelerations  Wynelle BourgeoisMarie Emma Schupp CNM<

## 2017-06-02 ENCOUNTER — Encounter: Payer: Self-pay | Admitting: Family Medicine

## 2017-06-03 ENCOUNTER — Telehealth: Payer: Self-pay | Admitting: *Deleted

## 2017-06-03 ENCOUNTER — Inpatient Hospital Stay (HOSPITAL_COMMUNITY)
Admission: AD | Admit: 2017-06-03 | Discharge: 2017-06-03 | Disposition: A | Discharge status: Home / Self Care | Payer: Medicaid Other | Source: Ambulatory Visit | Attending: Obstetrics & Gynecology | Admitting: Obstetrics & Gynecology

## 2017-06-03 ENCOUNTER — Encounter (HOSPITAL_COMMUNITY): Payer: Self-pay

## 2017-06-03 DIAGNOSIS — O479 False labor, unspecified: Secondary | ICD-10-CM

## 2017-06-03 DIAGNOSIS — O471 False labor at or after 37 completed weeks of gestation: Secondary | ICD-10-CM | POA: Diagnosis not present

## 2017-06-03 DIAGNOSIS — Z3689 Encounter for other specified antenatal screening: Secondary | ICD-10-CM

## 2017-06-03 DIAGNOSIS — R87612 Low grade squamous intraepithelial lesion on cytologic smear of cervix (LGSIL): Secondary | ICD-10-CM

## 2017-06-03 DIAGNOSIS — Z3A37 37 weeks gestation of pregnancy: Secondary | ICD-10-CM

## 2017-06-03 DIAGNOSIS — N898 Other specified noninflammatory disorders of vagina: Secondary | ICD-10-CM | POA: Diagnosis not present

## 2017-06-03 DIAGNOSIS — O24410 Gestational diabetes mellitus in pregnancy, diet controlled: Secondary | ICD-10-CM

## 2017-06-03 DIAGNOSIS — O26893 Other specified pregnancy related conditions, third trimester: Secondary | ICD-10-CM | POA: Diagnosis not present

## 2017-06-03 DIAGNOSIS — A599 Trichomoniasis, unspecified: Secondary | ICD-10-CM

## 2017-06-03 DIAGNOSIS — O99210 Obesity complicating pregnancy, unspecified trimester: Secondary | ICD-10-CM

## 2017-06-03 HISTORY — DX: Gestational diabetes mellitus in pregnancy, unspecified control: O24.419

## 2017-06-03 LAB — POCT FERN TEST: POCT Fern Test: NEGATIVE

## 2017-06-03 NOTE — MAU Provider Note (Signed)
History   [id number]   Chief Complaint  Patient presents with  . Labor Eval    HPI Cheryl Mullen is a 27 y.o. female  G1P0 @37 .0 wks here with report of leaking clear fluid x2 days. No gush of fluid. Leaking of fluid has continued. Pt reports 1-2 contractions per hr. She denies vaginal bleeding. Last intercourse was 2 days ago. She reports good fetal movement. All other systems negative.    Patient's last menstrual period was 09/04/2016 (exact date).  OB History  Gravida Para Term Preterm AB Living  1            SAB TAB Ectopic Multiple Live Births               # Outcome Date GA Lbr Len/2nd Weight Sex Delivery Anes PTL Lv  1 Current               Past Medical History:  Diagnosis Date  . Allergy   . Anemia   . Eczema   . Gestational diabetes     Family History  Problem Relation Age of Onset  . Hypertension Mother   . Hyperlipidemia Mother   . Hypertension Father   . Hyperlipidemia Father   . Hypertension Maternal Grandmother   . Hypertension Maternal Grandfather   . Diabetes Maternal Grandfather   . Hypertension Paternal Grandmother   . Hypertension Paternal Grandfather   . Cancer Neg Hx     Social History   Socioeconomic History  . Marital status: Single    Spouse name: None  . Number of children: None  . Years of education: None  . Highest education level: None  Social Needs  . Financial resource strain: None  . Food insecurity - worry: None  . Food insecurity - inability: None  . Transportation needs - medical: None  . Transportation needs - non-medical: None  Occupational History  . 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 Smoking status: Never Smoker  . Smokeless tobacco: Never Used  Substance and Sexual Activity  . Alcohol use: Yes    Alcohol/week: 0.0 oz  . Drug use: No  . Sexual activity: Yes    Partners: Female    Birth control/protection: Injection  Other Topics Concern  . None  Social History Narrative   Exercise--- NO     Allergies  Allergen Reactions  . Nickel Hives    No current facility-administered medications on file prior to encounter.    Current Outpatient Medications on File Prior to Encounter  Medication Sig Dispense Refill  . doxylamine, Sleep, (UNISOM) 25 MG tablet Take 1 tablet (25 mg total) by mouth at bedtime as needed (nausea and vomiting). 30 tablet 2  . glyBURIDE (DIABETA) 2.5 MG tablet Take 1 tablet (2.5 mg total) by mouth daily before supper. 30 tablet 3  . montelukast (SINGULAIR) 10 MG tablet Take 1 tablet (10 mg total) by mouth at bedtime. 90 tablet 3  . Prenatal MV-Min-FA-Omega-3 (PRENATAL GUMMIES/DHA & FA) 0.4-32.5 MG CHEW Chew 1 tablet by mouth daily. 90 tablet 3  . triamcinolone ointment (KENALOG) 0.1 % Apply 1 application topically 2 (two) times daily. Needs appointment for further refills. 60 g 3  . Vitamin D, Ergocalciferol, (DRISDOL) 50000 units CAPS capsule Take 1 capsule (50,000 Units total) by mouth every 7 (seven) days. 10 capsule 0  . ACCU-CHEK FASTCLIX LANCETS MISC 1 each by Percutaneous route 4 (four) times daily. 100 each 5  . glucose blood (ACCU-CHEK GUIDE) test strip  Use one test strip to check blood glucose 4 times daily. 100 each 5  . terconazole (TERAZOL 7) 0.4 % vaginal cream Place 1 applicator vaginally at bedtime. Inserted half way in the vagina. 45 g 1     Review of Systems  Gastrointestinal: Positive for abdominal pain (ctx).  Genitourinary: Positive for vaginal discharge. Negative for vaginal bleeding.     Physical Exam   Vitals:   06/03/17 1400  BP: 138/86  Pulse: (!) 109  Resp: 18  Temp: 98.9 F (37.2 C)  Weight: 237 lb (107.5 kg)  Height: 5' (1.524 m)    Physical Exam  Constitutional: She is oriented to person, place, and time. She appears well-developed and well-nourished. No distress.  HENT:  Head: Normocephalic and atraumatic.  Neck: Normal range of motion.  Respiratory: Effort normal. No respiratory distress.  Genitourinary:   Genitourinary Comments: SSE: +mucous, no pool, fern SVE 1/60/-3, vtx  Musculoskeletal: Normal range of motion.  Neurological: She is alert and oriented to person, place, and time.  Skin: Skin is warm and dry.  Psychiatric: She has a normal mood and affect.  EFM: 155 bpm, mod variability, + accels, no decels Toco: irritability  Results for orders placed or performed during the hospital encounter of 06/03/17 (from the past 24 hour(s))  POCT fern test     Status: None   Collection Time: 06/03/17  2:57 PM  Result Value Ref Range   POCT Fern Test Negative = intact amniotic membranes    MAU Course  Procedures  MDM No evidence of labor or ROM. Stable for discharge home.  Assessment and Plan  [redacted] weeks gestation Reactive NST False labor Vaginal discharge  Discharge home Follow up in OB office tomorrow as scheduled Labor precautions FMCs  Allergies as of 06/03/2017      Reactions   Nickel Hives      Medication List    STOP taking these medications   terconazole 0.4 % vaginal cream Commonly known as:  TERAZOL 7     TAKE these medications   ACCU-CHEK FASTCLIX LANCETS Misc 1 each by Percutaneous route 4 (four) times daily.   doxylamine (Sleep) 25 MG tablet Commonly known as:  UNISOM Take 1 tablet (25 mg total) by mouth at bedtime as needed (nausea and vomiting).   glucose blood test strip Commonly known as:  ACCU-CHEK GUIDE Use one test strip to check blood glucose 4 times daily.   glyBURIDE 2.5 MG tablet Commonly known as:  DIABETA Take 1 tablet (2.5 mg total) by mouth daily before supper.   montelukast 10 MG tablet Commonly known as:  SINGULAIR Take 1 tablet (10 mg total) by mouth at bedtime.   PRENATAL GUMMIES/DHA & FA 0.4-32.5 MG Chew Chew 1 tablet by mouth daily.   triamcinolone ointment 0.1 % Commonly known as:  KENALOG Apply 1 application topically 2 (two) times daily. Needs appointment for further refills.   Vitamin D (Ergocalciferol) 50000 units  Caps capsule Commonly known as:  DRISDOL Take 1 capsule (50,000 Units total) by mouth every 7 (seven) days.      Donette LarryBhambri, Marrell Dicaprio, CNM 06/03/2017 2:44 PM

## 2017-06-03 NOTE — MAU Note (Signed)
Pt reports she has been having ctx since Tuesday night. Contraction are not as strong as they were but she is also c/o leaking and wetness in her underwear. Good fetal movement reported

## 2017-06-03 NOTE — Discharge Instructions (Signed)

## 2017-06-03 NOTE — Telephone Encounter (Signed)
Pt called to office stating that she has increase in ctx and pain.  Pt states that she has been "leaking" as well.   [facility]. Pt states she will be seen today.

## 2017-06-04 ENCOUNTER — Ambulatory Visit (INDEPENDENT_AMBULATORY_CARE_PROVIDER_SITE_OTHER): Payer: Medicaid Other | Admitting: Family Medicine

## 2017-06-04 VITALS — BP 132/88 | HR 93 | Wt 236.0 lb

## 2017-06-04 DIAGNOSIS — D573 Sickle-cell trait: Secondary | ICD-10-CM

## 2017-06-04 DIAGNOSIS — L309 Dermatitis, unspecified: Secondary | ICD-10-CM

## 2017-06-04 DIAGNOSIS — Z3400 Encounter for supervision of normal first pregnancy, unspecified trimester: Secondary | ICD-10-CM

## 2017-06-04 DIAGNOSIS — Z3403 Encounter for supervision of normal first pregnancy, third trimester: Secondary | ICD-10-CM

## 2017-06-04 DIAGNOSIS — O24410 Gestational diabetes mellitus in pregnancy, diet controlled: Secondary | ICD-10-CM

## 2017-06-04 DIAGNOSIS — Z889 Allergy status to unspecified drugs, medicaments and biological substances status: Secondary | ICD-10-CM

## 2017-06-04 MED ORDER — TRIAMCINOLONE ACETONIDE 0.1 % EX OINT: 1.0000 "application " | TOPICAL_OINTMENT | Freq: Two times a day (BID) | CUTANEOUS | 3 refills | Status: DC

## 2017-06-04 MED ORDER — MONTELUKAST SODIUM 10 MG PO TABS: 10.0000 mg | ORAL_TABLET | Freq: Every day | ORAL | 3 refills | Status: DC

## 2017-06-04 NOTE — Progress Notes (Signed)
Subjective:  Cheryl Mullen is a 27 y.o. G1P0 at [redacted]w[redacted]d being seen today for ongoing prenatal care.  She is currently monitored for the following issues for this high-risk pregnancy and has Discoloration of skin; Obesity (BMI 30-39.9); Eczema; Panic; IDA (iron deficiency anemia); Menorrhagia with irregular cycle; Supervision of normal first pregnancy, antepartum; Trichomoniasis; LGSIL of cervix of undetermined significance; H/O hemoglobinopathy; Sickle cell trait (HCC); Maternal obesity, antepartum; and Gestational diabetes on their problem list.  GDM: Patient taking glyburide 2.5mg  qhs.  Reports no hypoglycemic episodes.  Tolerating medication well. Checks 1hr after eating Fasting: 88-90 2hr PP: 120s-130s  Patient reports no complaints.  Contractions: Irregular. Vag. Bleeding: None.  Movement: Present. Denies leaking of fluid.   The following portions of the patient's history were reviewed and updated as appropriate: allergies, current medications, past family history, past medical history, past social history, past surgical history and problem list. Problem list updated.  Objective:   Vitals:   06/04/17 1052  BP: 132/88  Pulse: 93  Weight: 236 lb (107 kg)    Fetal Status: Fetal Heart Rate (bpm): NST   Movement: Present     General:  Alert, oriented and cooperative. Patient is in no acute distress.  Skin: Skin is warm and dry. No rash noted.   Cardiovascular: Normal heart rate noted  Respiratory: Normal respiratory effort, no problems with respiration noted  Abdomen: Soft, gravid, appropriate for gestational age. Pain/Pressure: Present     Pelvic: Vag. Bleeding: None     Cervical exam deferred        Extremities: Normal range of motion.     Mental Status: Normal mood and affect. Normal behavior. Normal judgment and thought content.   Urinalysis:      Assessment and Plan:  Pregnancy: G1P0 at [redacted]w[redacted]d  1. Supervision of normal first pregnancy, antepartum FHT normal  2. Eczema,  unspecified type - triamcinolone ointment (KENALOG) 0.1 %; Apply 1 application topically 2 (two) times daily. Needs appointment for further refills.  Dispense: 60 g; Refill: 3  3. H/O seasonal allergies - montelukast (SINGULAIR) 10 MG tablet; Take 1 tablet (10 mg total) by mouth at bedtime.  Dispense: 90 tablet; Refill: 3  4. Diet controlled gestational diabetes mellitus (GDM) in third trimester NST reactive Continue glyburide Counseled to check CBG at night US next week  5. Sickle cell trait (HCC)   Term labor symptoms and general obstetric precautions including but not limited to vaginal bleeding, contractions, leaking of fluid and fetal movement were reviewed in detail with the patient. Please refer to After Visit Summary for other counseling recommendations.  No Follow-up on file.   Levie HeritageStinson, Jacob J, DO

## 2017-06-06 ENCOUNTER — Inpatient Hospital Stay (HOSPITAL_COMMUNITY)
Admission: AD | Admit: 2017-06-06 | Discharge: 2017-06-09 | DRG: 786 | Disposition: A | Discharge status: Home / Self Care | Payer: Medicaid Other | Source: Ambulatory Visit | Attending: Obstetrics & Gynecology | Admitting: Obstetrics & Gynecology

## 2017-06-06 ENCOUNTER — Other Ambulatory Visit: Payer: Self-pay

## 2017-06-06 ENCOUNTER — Inpatient Hospital Stay (HOSPITAL_COMMUNITY): Payer: Medicaid Other | Admitting: Anesthesiology

## 2017-06-06 ENCOUNTER — Encounter (HOSPITAL_COMMUNITY): Payer: Self-pay

## 2017-06-06 DIAGNOSIS — R87612 Low grade squamous intraepithelial lesion on cytologic smear of cervix (LGSIL): Secondary | ICD-10-CM

## 2017-06-06 DIAGNOSIS — O411230 Chorioamnionitis, third trimester, not applicable or unspecified: Secondary | ICD-10-CM | POA: Diagnosis present

## 2017-06-06 DIAGNOSIS — A599 Trichomoniasis, unspecified: Secondary | ICD-10-CM

## 2017-06-06 DIAGNOSIS — D573 Sickle-cell trait: Secondary | ICD-10-CM | POA: Diagnosis present

## 2017-06-06 DIAGNOSIS — O99214 Obesity complicating childbirth: Secondary | ICD-10-CM | POA: Diagnosis present

## 2017-06-06 DIAGNOSIS — O99210 Obesity complicating pregnancy, unspecified trimester: Secondary | ICD-10-CM

## 2017-06-06 DIAGNOSIS — Z3A37 37 weeks gestation of pregnancy: Secondary | ICD-10-CM | POA: Diagnosis not present

## 2017-06-06 DIAGNOSIS — O24425 Gestational diabetes mellitus in childbirth, controlled by oral hypoglycemic drugs: Secondary | ICD-10-CM | POA: Diagnosis present

## 2017-06-06 DIAGNOSIS — O9902 Anemia complicating childbirth: Secondary | ICD-10-CM | POA: Diagnosis present

## 2017-06-06 DIAGNOSIS — O4292 Full-term premature rupture of membranes, unspecified as to length of time between rupture and onset of labor: Secondary | ICD-10-CM | POA: Diagnosis present

## 2017-06-06 DIAGNOSIS — O24419 Gestational diabetes mellitus in pregnancy, unspecified control: Secondary | ICD-10-CM | POA: Diagnosis present

## 2017-06-06 DIAGNOSIS — O324XX0 Maternal care for high head at term, not applicable or unspecified: Secondary | ICD-10-CM | POA: Diagnosis present

## 2017-06-06 DIAGNOSIS — Z3A38 38 weeks gestation of pregnancy: Secondary | ICD-10-CM

## 2017-06-06 DIAGNOSIS — O24415 Gestational diabetes mellitus in pregnancy, controlled by oral hypoglycemic drugs: Secondary | ICD-10-CM

## 2017-06-06 LAB — POCT FERN TEST: POCT FERN TEST: POSITIVE

## 2017-06-06 LAB — CBC
HEMATOCRIT: 39.9 % (ref 36.0–46.0)
Hemoglobin: 13.9 g/dL (ref 12.0–15.0)
MCH: 29.6 pg (ref 26.0–34.0)
MCHC: 34.8 g/dL (ref 30.0–36.0)
MCV: 84.9 fL (ref 78.0–100.0)
Platelets: 252 10*3/uL (ref 150–400)
RBC: 4.7 MIL/uL (ref 3.87–5.11)
RDW: 14.1 % (ref 11.5–15.5)
WBC: 8.7 10*3/uL (ref 4.0–10.5)

## 2017-06-06 LAB — TYPE AND SCREEN
ABO/RH(D): O POS
ANTIBODY SCREEN: NEGATIVE

## 2017-06-06 LAB — GLUCOSE, CAPILLARY
GLUCOSE-CAPILLARY: 68 mg/dL (ref 65–99)
GLUCOSE-CAPILLARY: 81 mg/dL (ref 65–99)
GLUCOSE-CAPILLARY: 98 mg/dL (ref 65–99)

## 2017-06-06 LAB — ABO/RH: ABO/RH(D): O POS

## 2017-06-06 MED ORDER — LACTATED RINGERS IV SOLN
500.0000 mL | Freq: Once | INTRAVENOUS | Status: AC
  Administered 2017-06-06: 500 mL via INTRAVENOUS

## 2017-06-06 MED ORDER — SOD CITRATE-CITRIC ACID 500-334 MG/5ML PO SOLN
30.0000 mL | ORAL | Status: DC | PRN
  Administered 2017-06-07: 30 mL via ORAL
  Filled 2017-06-06: qty 15

## 2017-06-06 MED ORDER — ONDANSETRON HCL 4 MG/2ML IJ SOLN
4.0000 mg | Freq: Four times a day (QID) | INTRAMUSCULAR | Status: DC | PRN
  Administered 2017-06-06: 4 mg via INTRAVENOUS
  Filled 2017-06-06: qty 2

## 2017-06-06 MED ORDER — PHENYLEPHRINE 40 MCG/ML (10ML) SYRINGE FOR IV PUSH (FOR BLOOD PRESSURE SUPPORT)
80.0000 ug | PREFILLED_SYRINGE | INTRAVENOUS | Status: DC | PRN
  Administered 2017-06-06: 80 ug via INTRAVENOUS
  Filled 2017-06-06: qty 10

## 2017-06-06 MED ORDER — LIDOCAINE HCL (PF) 1 % IJ SOLN
INTRAMUSCULAR | Status: DC | PRN
  Administered 2017-06-06 (×2): 4 mL

## 2017-06-06 MED ORDER — LACTATED RINGERS IV SOLN
INTRAVENOUS | Status: DC
  Administered 2017-06-06 – 2017-06-07 (×4): via INTRAVENOUS

## 2017-06-06 MED ORDER — TERBUTALINE SULFATE 1 MG/ML IJ SOLN: 0.2500 mg | Freq: Once | INTRAMUSCULAR | Status: DC | PRN

## 2017-06-06 MED ORDER — OXYTOCIN 40 UNITS IN LACTATED RINGERS INFUSION - SIMPLE MED
2.5000 [IU]/h | INTRAVENOUS | Status: DC
  Filled 2017-06-06: qty 1000

## 2017-06-06 MED ORDER — LACTATED RINGERS IV SOLN
500.0000 mL | INTRAVENOUS | Status: DC | PRN
  Administered 2017-06-06: 1000 mL via INTRAVENOUS
  Administered 2017-06-07 (×2): 500 mL via INTRAVENOUS

## 2017-06-06 MED ORDER — FENTANYL 2.5 MCG/ML BUPIVACAINE 1/10 % EPIDURAL INFUSION (WH - ANES)
14.0000 mL/h | INTRAMUSCULAR | Status: DC | PRN
  Administered 2017-06-06 – 2017-06-07 (×4): 14 mL/h via EPIDURAL
  Filled 2017-06-06 (×4): qty 100

## 2017-06-06 MED ORDER — FENTANYL 2.5 MCG/ML BUPIVACAINE 1/10 % EPIDURAL INFUSION (WH - ANES): 14.0000 mL/h | INTRAMUSCULAR | Status: DC | PRN

## 2017-06-06 MED ORDER — ACETAMINOPHEN 325 MG PO TABS
650.0000 mg | ORAL_TABLET | ORAL | Status: DC | PRN
  Administered 2017-06-06 – 2017-06-07 (×3): 650 mg via ORAL
  Filled 2017-06-06 (×3): qty 2

## 2017-06-06 MED ORDER — EPHEDRINE 5 MG/ML INJ: 10.0000 mg | INTRAVENOUS | Status: DC | PRN

## 2017-06-06 MED ORDER — PHENYLEPHRINE 40 MCG/ML (10ML) SYRINGE FOR IV PUSH (FOR BLOOD PRESSURE SUPPORT)
80.0000 ug | PREFILLED_SYRINGE | INTRAVENOUS | Status: DC | PRN
  Administered 2017-06-06: 80 ug via INTRAVENOUS

## 2017-06-06 MED ORDER — OXYTOCIN 40 UNITS IN LACTATED RINGERS INFUSION - SIMPLE MED
1.0000 m[IU]/min | INTRAVENOUS | Status: DC
  Administered 2017-06-06: 2 m[IU]/min via INTRAVENOUS
  Administered 2017-06-07: 10 m[IU]/min via INTRAVENOUS
  Filled 2017-06-06: qty 1000

## 2017-06-06 MED ORDER — OXYTOCIN BOLUS FROM INFUSION: 500.0000 mL | Freq: Once | INTRAVENOUS | Status: DC

## 2017-06-06 MED ORDER — SODIUM CHLORIDE 0.9 % IV SOLN
2.0000 g | Freq: Four times a day (QID) | INTRAVENOUS | Status: DC
  Administered 2017-06-06 – 2017-06-07 (×4): 2 g via INTRAVENOUS
  Filled 2017-06-06: qty 2000
  Filled 2017-06-06: qty 2
  Filled 2017-06-06: qty 2000
  Filled 2017-06-06 (×3): qty 2

## 2017-06-06 MED ORDER — DIPHENHYDRAMINE HCL 50 MG/ML IJ SOLN: 12.5000 mg | INTRAMUSCULAR | Status: DC | PRN

## 2017-06-06 MED ORDER — FENTANYL CITRATE (PF) 100 MCG/2ML IJ SOLN
INTRAMUSCULAR | Status: AC
  Filled 2017-06-06: qty 2

## 2017-06-06 MED ORDER — FLEET ENEMA 7-19 GM/118ML RE ENEM: 1.0000 | ENEMA | RECTAL | Status: DC | PRN

## 2017-06-06 MED ORDER — FENTANYL CITRATE (PF) 100 MCG/2ML IJ SOLN
100.0000 ug | INTRAMUSCULAR | Status: DC | PRN
  Administered 2017-06-06: 100 ug via INTRAVENOUS

## 2017-06-06 MED ORDER — OXYCODONE-ACETAMINOPHEN 5-325 MG PO TABS: 1.0000 | ORAL_TABLET | ORAL | Status: DC | PRN

## 2017-06-06 MED ORDER — LIDOCAINE HCL (PF) 1 % IJ SOLN: 30.0000 mL | INTRAMUSCULAR | Status: DC | PRN

## 2017-06-06 MED ORDER — OXYCODONE-ACETAMINOPHEN 5-325 MG PO TABS: 2.0000 | ORAL_TABLET | ORAL | Status: DC | PRN

## 2017-06-06 MED ORDER — GENTAMICIN SULFATE 40 MG/ML IJ SOLN
160.0000 mg | Freq: Three times a day (TID) | INTRAVENOUS | Status: DC
  Administered 2017-06-06 – 2017-06-07 (×3): 160 mg via INTRAVENOUS
  Filled 2017-06-06 (×4): qty 4

## 2017-06-06 NOTE — MAU Note (Signed)
Reports LOF since 2230, has soaked 2 hand towels since. Feeling some ctx, no bleeding, +FM

## 2017-06-06 NOTE — H&P (Addendum)
Cheryl Mullen is a 27 y.o. female presenting for SROM at 2200 on 06/05/2017. GBS is negative. Clear fluid. OB History    Gravida Para Term Preterm AB Living   1             SAB TAB Ectopic Multiple Live Births                 Past Medical History:  Diagnosis Date  . Allergy   . Anemia   . Eczema   . Gestational diabetes    Past Surgical History:  Procedure Laterality Date  . TONSILLECTOMY AND ADENOIDECTOMY Bilateral 01/02/2013  . WISDOM TOOTH EXTRACTION     Family History: family history includes Diabetes in her maternal grandfather; Hyperlipidemia in her father and mother; Hypertension in her father, maternal grandfather, maternal grandmother, mother, paternal grandfather, and paternal grandmother. Social History:  reports that  has never smoked. she has never used smokeless tobacco. She reports that she drinks alcohol. She reports that she does not use drugs.     Maternal Diabetes: Yes:  Diabetes Type:  Insulin/Medication controlled Genetic Screening: Normal Maternal Ultrasounds/Referrals: Normal Fetal Ultrasounds or other Referrals:  None Maternal Substance Abuse:  No Significant Maternal Medications:  Meds include: Other: glyburide Significant Maternal Lab Results:  None Other Comments:  Sickle cell trait  Review of Systems  Constitutional: Negative.   HENT: Negative.   Eyes: Negative.   Respiratory: Negative.   Cardiovascular: Negative.   Gastrointestinal: Negative.   Genitourinary:       Watery vaginal discharge  Musculoskeletal: Negative.   Skin: Negative.   Neurological: Negative.   Endo/Heme/Allergies: Negative.   Psychiatric/Behavioral: Negative.    Maternal Medical History:  Reason for admission: Rupture of membranes.   Contractions: Onset was 6-12 hours ago.   Frequency: irregular.   Duration is approximately 60 seconds.   Perceived severity is mild.    Fetal activity: Perceived fetal activity is normal.   Last perceived fetal movement was within  the past hour.    Prenatal complications: no prenatal complications Prenatal Complications - Diabetes: gestational. Diabetes is managed by oral agent (monotherapy).      Dilation: 2 Effacement (%): 70 Station: -3 Exam by:: n druebbisch rn Blood pressure 138/87, pulse (!) 103, temperature 98 F (36.7 C), temperature source Oral, resp. rate 18, height 5' (1.524 m), weight 107.4 kg (236 lb 12 oz), last menstrual period 09/04/2016. Maternal Exam:  Uterine Assessment: Contraction frequency is rare.   Abdomen: Patient reports no abdominal tenderness. Estimated fetal weight is 8.5 pounds.   Fetal presentation: vertex  Introitus: Normal vulva. Normal vagina.  Amniotic fluid character: clear.  Pelvis: adequate for delivery.   Cervix: Cervix evaluated by digital exam.     Physical Exam  Constitutional: She is oriented to person, place, and time. She appears well-developed and well-nourished.  HENT:  Head: Normocephalic.  Cardiovascular: Normal rate and regular rhythm.  Respiratory: Effort normal and breath sounds normal.  GI: Soft. Bowel sounds are normal.  Genitourinary: Vagina normal and uterus normal.  Musculoskeletal: Normal range of motion.  Neurological: She is alert and oriented to person, place, and time. She has normal reflexes.  Skin: Skin is warm and dry.  Psychiatric: She has a normal mood and affect.    Prenatal labs: ABO, Rh: O/Positive/-- (09/13 1152) Antibody: Negative (09/13 1152) Rubella: 2.72 (09/13 1152) RPR: Non Reactive (12/28 0835)  HBsAg: Negative (09/13 1152)  HIV: Non Reactive (12/28 0835)  GBS: Negative (02/22 1133)   Assessment/Plan: SROM  38 week pregnancy - GBS GDM on glyburide Start pitocin Anticipate NVD  Fetal status reassuring, moderate variability, accels, baseline 140s, no decels  Cheryl Mullen 06/06/2017, 10:20 AM

## 2017-06-06 NOTE — Anesthesia Procedure Notes (Signed)
Epidural Patient location during procedure: OB Start time: 06/06/2017 2:00 PM End time: 06/06/2017 2:10 PM  Staffing Anesthesiologist: Lewie LoronGermeroth, Terrelle Ruffolo, MD Performed: anesthesiologist   Preanesthetic Checklist Completed: patient identified, pre-op evaluation, timeout performed, IV checked, risks and benefits discussed and monitors and equipment checked  Epidural Patient position: sitting Prep: site prepped and draped and DuraPrep Patient monitoring: heart rate, continuous pulse ox and blood pressure Approach: midline Location: L3-L4 Injection technique: LOR air and LOR saline  Needle:  Needle type: Tuohy  Needle gauge: 17 G Needle length: 9 cm Needle insertion depth: 8 cm Catheter type: closed end flexible Catheter size: 19 Gauge Catheter at skin depth: 13 cm Test dose: negative  Assessment Sensory level: T8 Events: blood not aspirated, injection not painful, no injection resistance, negative IV test and no paresthesia  Additional Notes Reason for block:procedure for pain

## 2017-06-06 NOTE — Anesthesia Preprocedure Evaluation (Signed)
Anesthesia Evaluation  Patient identified by MRN, date of birth, ID band Patient awake    Reviewed: Allergy & Precautions, NPO status , Patient's Chart, lab work & pertinent test results  Airway Mallampati: II  TM Distance: >3 FB Neck ROM: Full    Dental no notable dental hx.    Pulmonary neg pulmonary ROS,    Pulmonary exam normal breath sounds clear to auscultation       Cardiovascular negative cardio ROS Normal cardiovascular exam Rhythm:Regular Rate:Normal     Neuro/Psych PSYCHIATRIC DISORDERS Anxiety negative neurological ROS     GI/Hepatic negative GI ROS, Neg liver ROS,   Endo/Other  diabetes, GestationalMorbid obesity  Renal/GU negative Renal ROS     Musculoskeletal negative musculoskeletal ROS (+)   Abdominal (+) + obese,   Peds  Hematology  (+) Sickle cell trait and anemia ,   Anesthesia Other Findings   Reproductive/Obstetrics (+) Pregnancy                             Anesthesia Physical Anesthesia Plan  ASA: III  Anesthesia Plan: Epidural   Post-op Pain Management:    Induction:   PONV Risk Score and Plan:   Airway Management Planned:   Additional Equipment:   Intra-op Plan:   Post-operative Plan:   Informed Consent: I have reviewed the patients History and Physical, chart, labs and discussed the procedure including the risks, benefits and alternatives for the proposed anesthesia with the patient or authorized representative who has indicated his/her understanding and acceptance.     Plan Discussed with:   Anesthesia Plan Comments:         Anesthesia Quick Evaluation

## 2017-06-06 NOTE — Progress Notes (Signed)
ANTIBIOTIC CONSULT NOTE - INITIAL  Pharmacy Consult for Gentamicin Indication: Chorioamnionitis - maternal fever during delivery  Allergies  Allergen Reactions  . Nickel Hives    Patient Measurements: Height: 5' (152.4 cm) Weight: 236 lb 12 oz (107.4 kg) IBW/kg (Calculated) : 45.5 Adjusted Body Weight: 64.1  Vital Signs: Temp: 101.3 F (38.5 C) (03/03 1941) Temp Source: Axillary (03/03 1941) BP: 140/76 (03/03 1931) Pulse Rate: 104 (03/03 1931)  Labs: Recent Labs    06/06/17 0935  WBC 8.7  HGB 13.9  PLT 252   No results for input(s): GENTTROUGH, GENTPEAK, GENTRANDOM in the last 72 hours.   Microbiology: Recent Results (from the past 720 hour(s))  Urine Culture     Status: None   Collection Time: 05/25/17  8:23 AM  Result Value Ref Range Status   Urine Culture, Routine Final report  Final   Organism ID, Bacteria Comment  Final    Comment: Mixed urogenital flora 10,000-25,000 colony forming units per mL   Strep Gp B NAA     Status: None   Collection Time: 05/28/17 11:33 AM  Result Value Ref Range Status   Strep Gp B NAA Negative Negative Final    Comment: Centers for Disease Control and Prevention (CDC) and American Congress of Obstetricians and Gynecologists (ACOG) guidelines for prevention of perinatal group B streptococcal (GBS) disease specify co-collection of a vaginal and rectal swab specimen to maximize sensitivity of GBS detection. Per the CDC and ACOG, swabbing both the lower vagina and rectum substantially increases the yield of detection compared with sampling the vagina alone. Penicillin G, ampicillin, or cefazolin are indicated for intrapartum prophylaxis of perinatal GBS colonization. Reflex susceptibility testing should be performed prior to use of clindamycin only on GBS isolates from penicillin-allergic women who are considered a high risk for anaphylaxis. Treatment with vancomycin without additional testing is warranted if resistance to  clindamycin is noted.     Medications:  Amp 2g IV q6h  Assessment: 27 y.o. female G1P0 at 3741w3d  Estimated Ke = 0.306, Vd = 24.3 L  Goal of Therapy:  Gentamicin peak 6-8 mg/L and Trough < 1 mg/L  Plan:   Gentamicin 160 mg IV every 8 hrs  Check Scr with next labs if gentamicin continued. Will check gentamicin levels if continued > 72hr or clinically indicated.  Drusilla KannerGrimsley, Jadarion Halbig Lydia 06/06/2017,7:51 PM

## 2017-06-06 NOTE — Anesthesia Pain Management Evaluation Note (Signed)
  [facility] Pain Management Visit Note  Patient: Cheryl ColonelJamelia Malachi, 27 y.o., female  "Hello I am a member of the anesthesia team at [facility]. We have an anesthesia team available at all times to provide care throughout the hospital, including epidural management and anesthesia for C-section. I don't know your plan for the delivery whether it a natural birth, water birth, IV sedation, nitrous supplementation, doula or epidural, but we want to meet your pain goals."   1.Was your pain managed to your expectations on prior hospitalizations?   No prior hospitalizations  2.What is your expectation for pain management during this hospitalization?     Epidural  3.How can we help you reach that goal? Epidural in place at time of visit  Record the patient's initial score and the patient's pain goal.   Pain: 0  Pain Goal: 4 [facility] wants you to be able to say your pain was always managed very well.  Rica RecordsICKELTON,Mattison Golay 06/06/2017

## 2017-06-06 NOTE — Progress Notes (Addendum)
Cheryl ColonelJamelia Mullen is a 27 y.o. G1P0 at 6959w3d by admitted for rupture of membranes  Subjective: Pt states contractions are painful, requests epidural, anesthesia in room  Objective: BP 120/75 (BP Location: Right Arm)   Pulse (!) 101   Temp 97.8 F (36.6 C) (Oral)   Resp 19   Ht 5' (1.524 m)   Wt 107.4 kg (236 lb 12 oz)   LMP 09/04/2016 (Exact Date)   SpO2 99%   BMI 46.24 kg/m  No intake/output data recorded. No intake/output data recorded.  FHT:  FHR: 140s bpm, variability: moderate,  accelerations:  Present,  decelerations:  Absent UC:   regular, every 2-3 minutes SVE:   Dilation: 2 Effacement (%): 50 Station: -2 Exam by:: Lorn Junes. Goodman, RN  Labs: Lab Results  Component Value Date   WBC 8.7 06/06/2017   HGB 13.9 06/06/2017   HCT 39.9 06/06/2017   MCV 84.9 06/06/2017   PLT 252 06/06/2017    Assessment / Plan: Induction of labor due to PROM,  progressing well on pitocin  Labor: Progressing normally Preeclampsia:  n/a Fetal Wellbeing:  Category I Pain Control:  Epidural I/D:  n/a Anticipated MOD:  NSVD  [name] 06/06/2017, 1:51 PM  I was present and assessed this pt and agree with above assessment

## 2017-06-06 NOTE — Progress Notes (Addendum)
LABOR PROGRESS NOTE  Cheryl Mullen is a 27 y.o. G1P0 at 3248w3d  admitted for PROM at 2200 yesterday.  Subjective: Patient doing well. Pain is controlled with epidural.  Objective: BP 128/71 (BP Location: Right Arm)   Pulse (!) 103   Temp (!) 100.4 F (38 C) (Axillary)   Resp 17   Ht 5' (1.524 m)   Wt 236 lb 12 oz (107.4 kg)   LMP 09/04/2016 (Exact Date)   SpO2 100%   BMI 46.24 kg/m  or  Vitals:   06/06/17 2031 06/06/17 2032 06/06/17 2101 06/06/17 2123  BP: 125/70 125/70 128/71   Pulse: (!) 109 (!) 109 (!) 103   Resp:  18 17   Temp:  100 F (37.8 C)  (!) 100.4 F (38 C)  TempSrc:  Axillary  Axillary  SpO2:      Weight:      Height:        Last SVE Dilation: 3.5 Effacement (%): 80 Cervical Position: Middle Station: -2 Presentation: Vertex Exam by:: Mary SwazilandJordan Johnson, RN  FHT: baseline rate 150, moderate varibility, +acel, no decel Toco: ctx q~422min, MVU ~250  Assessment / Plan: 27 y.o. G1P0 at 5548w3d here for IOL for PROM  Labor: On IV Pitocin. Adequate MVUs. Will recheck SVE soon Triple I: Fever up to 101.3, ROM > 18h. Amp/Gent started Fetal Wellbeing:  Cat I Pain Control:  Epidural Anticipated MOD:  SVD  Frederik PearJulie P Degele, MD 06/06/2017, 9:55 PM

## 2017-06-07 ENCOUNTER — Encounter (HOSPITAL_COMMUNITY): Payer: Self-pay | Admitting: *Deleted

## 2017-06-07 ENCOUNTER — Encounter (HOSPITAL_COMMUNITY)
Admission: AD | Disposition: A | Discharge status: Home / Self Care | Payer: Self-pay | Source: Ambulatory Visit | Attending: Obstetrics & Gynecology

## 2017-06-07 DIAGNOSIS — O411230 Chorioamnionitis, third trimester, not applicable or unspecified: Secondary | ICD-10-CM

## 2017-06-07 DIAGNOSIS — Z3A37 37 weeks gestation of pregnancy: Secondary | ICD-10-CM

## 2017-06-07 DIAGNOSIS — O24425 Gestational diabetes mellitus in childbirth, controlled by oral hypoglycemic drugs: Secondary | ICD-10-CM

## 2017-06-07 LAB — RPR: RPR Ser Ql: NONREACTIVE

## 2017-06-07 LAB — GLUCOSE, CAPILLARY
GLUCOSE-CAPILLARY: 106 mg/dL — AB (ref 65–99)
GLUCOSE-CAPILLARY: 98 mg/dL (ref 65–99)
GLUCOSE-CAPILLARY: 109 mg/dL — AB (ref 65–99)
Glucose-Capillary: 128 mg/dL — ABNORMAL HIGH (ref 65–99)
Glucose-Capillary: 132 mg/dL — ABNORMAL HIGH (ref 65–99)
Glucose-Capillary: 84 mg/dL (ref 65–99)

## 2017-06-07 SURGERY — Surgical Case
Anesthesia: Epidural

## 2017-06-07 MED ORDER — FENTANYL CITRATE (PF) 100 MCG/2ML IJ SOLN
INTRAMUSCULAR | Status: AC
  Filled 2017-06-07: qty 2

## 2017-06-07 MED ORDER — SIMETHICONE 80 MG PO CHEW: 80.0000 mg | CHEWABLE_TABLET | ORAL | Status: DC | PRN

## 2017-06-07 MED ORDER — OXYTOCIN 10 UNIT/ML IJ SOLN
INTRAVENOUS | Status: DC | PRN
  Administered 2017-06-07: 40 [IU] via INTRAVENOUS

## 2017-06-07 MED ORDER — MORPHINE SULFATE (PF) 0.5 MG/ML IJ SOLN
INTRAMUSCULAR | Status: DC | PRN
  Administered 2017-06-07: 1 mg via INTRAVENOUS
  Administered 2017-06-07: 4 mg via EPIDURAL

## 2017-06-07 MED ORDER — SODIUM BICARBONATE 8.4 % IV SOLN
INTRAVENOUS | Status: DC | PRN
  Administered 2017-06-07 (×2): 5 mL via EPIDURAL

## 2017-06-07 MED ORDER — SENNOSIDES-DOCUSATE SODIUM 8.6-50 MG PO TABS
2.0000 | ORAL_TABLET | ORAL | Status: DC
  Administered 2017-06-08: 2 via ORAL
  Filled 2017-06-07: qty 2

## 2017-06-07 MED ORDER — FENTANYL CITRATE (PF) 100 MCG/2ML IJ SOLN
25.0000 ug | INTRAMUSCULAR | Status: DC | PRN
Start: 2017-06-07 — End: 2017-06-07
  Administered 2017-06-07 (×2): 50 ug via INTRAVENOUS

## 2017-06-07 MED ORDER — FERROUS SULFATE 325 (65 FE) MG PO TABS
325.0000 mg | ORAL_TABLET | Freq: Two times a day (BID) | ORAL | Status: DC
  Administered 2017-06-08: 325 mg via ORAL
  Filled 2017-06-07 (×2): qty 1

## 2017-06-07 MED ORDER — OXYTOCIN 40 UNITS IN LACTATED RINGERS INFUSION - SIMPLE MED: 2.5000 [IU]/h | INTRAVENOUS | Status: AC

## 2017-06-07 MED ORDER — METHYLERGONOVINE MALEATE 0.2 MG/ML IJ SOLN
INTRAMUSCULAR | Status: DC | PRN
  Administered 2017-06-07: 0.2 mg via INTRAMUSCULAR

## 2017-06-07 MED ORDER — DIBUCAINE 1 % RE OINT: 1.0000 "application " | TOPICAL_OINTMENT | RECTAL | Status: DC | PRN

## 2017-06-07 MED ORDER — MEASLES, MUMPS & RUBELLA VAC ~~LOC~~ INJ: 0.5000 mL | INJECTION | Freq: Once | SUBCUTANEOUS | Status: DC

## 2017-06-07 MED ORDER — ONDANSETRON HCL 4 MG/2ML IJ SOLN
INTRAMUSCULAR | Status: DC | PRN
  Administered 2017-06-07: 4 mg via INTRAVENOUS

## 2017-06-07 MED ORDER — BUPIVACAINE HCL (PF) 0.5 % IJ SOLN
INTRAMUSCULAR | Status: AC
  Filled 2017-06-07: qty 30

## 2017-06-07 MED ORDER — SODIUM CHLORIDE 0.9 % IV SOLN
2.0000 g | Freq: Once | INTRAVENOUS | Status: AC
  Administered 2017-06-07: 2 g via INTRAVENOUS
  Filled 2017-06-07: qty 2

## 2017-06-07 MED ORDER — PRENATAL MULTIVITAMIN CH
1.0000 | ORAL_TABLET | Freq: Every day | ORAL | Status: DC
  Administered 2017-06-08 – 2017-06-09 (×2): 1 via ORAL
  Filled 2017-06-07: qty 1

## 2017-06-07 MED ORDER — LIDOCAINE-EPINEPHRINE (PF) 2 %-1:200000 IJ SOLN
INTRAMUSCULAR | Status: AC
  Filled 2017-06-07: qty 20

## 2017-06-07 MED ORDER — ACETAMINOPHEN 325 MG PO TABS: 650.0000 mg | ORAL_TABLET | ORAL | Status: DC | PRN

## 2017-06-07 MED ORDER — MORPHINE SULFATE (PF) 0.5 MG/ML IJ SOLN
INTRAMUSCULAR | Status: AC
  Filled 2017-06-07: qty 10

## 2017-06-07 MED ORDER — OXYCODONE-ACETAMINOPHEN 5-325 MG PO TABS: 2.0000 | ORAL_TABLET | ORAL | Status: DC | PRN

## 2017-06-07 MED ORDER — SIMETHICONE 80 MG PO CHEW
80.0000 mg | CHEWABLE_TABLET | ORAL | Status: DC
  Administered 2017-06-08 – 2017-06-09 (×2): 80 mg via ORAL
  Filled 2017-06-07: qty 1

## 2017-06-07 MED ORDER — SCOPOLAMINE 1 MG/3DAYS TD PT72
MEDICATED_PATCH | TRANSDERMAL | Status: DC | PRN
  Administered 2017-06-07: 1 via TRANSDERMAL

## 2017-06-07 MED ORDER — IBUPROFEN 600 MG PO TABS
600.0000 mg | ORAL_TABLET | Freq: Four times a day (QID) | ORAL | Status: DC
  Administered 2017-06-08 – 2017-06-09 (×5): 600 mg via ORAL
  Filled 2017-06-07 (×4): qty 1

## 2017-06-07 MED ORDER — MAGNESIUM HYDROXIDE 400 MG/5ML PO SUSP: 30.0000 mL | ORAL | Status: DC | PRN

## 2017-06-07 MED ORDER — SODIUM CHLORIDE 0.9 % IR SOLN
Status: DC | PRN
  Administered 2017-06-07: 1

## 2017-06-07 MED ORDER — LACTATED RINGERS IV SOLN: INTRAVENOUS | Status: DC

## 2017-06-07 MED ORDER — ONDANSETRON HCL 4 MG/2ML IJ SOLN
INTRAMUSCULAR | Status: AC
  Filled 2017-06-07: qty 2

## 2017-06-07 MED ORDER — LACTATED RINGERS IV SOLN
INTRAVENOUS | Status: DC | PRN
  Administered 2017-06-07: 18:00:00 via INTRAVENOUS

## 2017-06-07 MED ORDER — COCONUT OIL OIL
1.0000 "application " | TOPICAL_OIL | Status: DC | PRN
  Administered 2017-06-08: 1 via TOPICAL
  Filled 2017-06-07: qty 120

## 2017-06-07 MED ORDER — DIPHENHYDRAMINE HCL 25 MG PO CAPS: 25.0000 mg | ORAL_CAPSULE | Freq: Four times a day (QID) | ORAL | Status: DC | PRN

## 2017-06-07 MED ORDER — MEPERIDINE HCL 25 MG/ML IJ SOLN: 6.2500 mg | INTRAMUSCULAR | Status: DC | PRN

## 2017-06-07 MED ORDER — SODIUM CHLORIDE 0.9 % IV SOLN
500.0000 mg | Freq: Once | INTRAVENOUS | Status: AC
  Administered 2017-06-07: 500 mg via INTRAVENOUS
  Filled 2017-06-07: qty 500

## 2017-06-07 MED ORDER — WITCH HAZEL-GLYCERIN EX PADS: 1.0000 "application " | MEDICATED_PAD | CUTANEOUS | Status: DC | PRN

## 2017-06-07 MED ORDER — MONTELUKAST SODIUM 10 MG PO TABS
10.0000 mg | ORAL_TABLET | Freq: Every day | ORAL | Status: DC
  Administered 2017-06-08: 10 mg via ORAL
  Filled 2017-06-07 (×4): qty 1

## 2017-06-07 MED ORDER — ENOXAPARIN SODIUM 60 MG/0.6ML ~~LOC~~ SOLN
50.0000 mg | SUBCUTANEOUS | Status: DC
  Administered 2017-06-08: 50 mg via SUBCUTANEOUS
  Filled 2017-06-07 (×3): qty 0.6

## 2017-06-07 MED ORDER — OXYCODONE-ACETAMINOPHEN 5-325 MG PO TABS
1.0000 | ORAL_TABLET | ORAL | Status: DC | PRN
  Administered 2017-06-09: 1 via ORAL

## 2017-06-07 MED ORDER — SODIUM BICARBONATE 8.4 % IV SOLN
INTRAVENOUS | Status: AC
  Filled 2017-06-07: qty 50

## 2017-06-07 MED ORDER — SCOPOLAMINE 1 MG/3DAYS TD PT72
MEDICATED_PATCH | TRANSDERMAL | Status: AC
  Filled 2017-06-07: qty 1

## 2017-06-07 MED ORDER — TETANUS-DIPHTH-ACELL PERTUSSIS 5-2.5-18.5 LF-MCG/0.5 IM SUSP: 0.5000 mL | Freq: Once | INTRAMUSCULAR | Status: DC

## 2017-06-07 MED ORDER — MENTHOL 3 MG MT LOZG: 1.0000 | LOZENGE | OROMUCOSAL | Status: DC | PRN

## 2017-06-07 MED ORDER — ENOXAPARIN SODIUM 40 MG/0.4ML ~~LOC~~ SOLN
40.0000 mg | SUBCUTANEOUS | Status: DC
  Filled 2017-06-07: qty 0.4

## 2017-06-07 MED ORDER — BUPIVACAINE HCL (PF) 0.5 % IJ SOLN
INTRAMUSCULAR | Status: DC | PRN
  Administered 2017-06-07: 30 mL

## 2017-06-07 MED ORDER — OXYTOCIN 10 UNIT/ML IJ SOLN
INTRAMUSCULAR | Status: AC
  Filled 2017-06-07: qty 4

## 2017-06-07 MED ORDER — ZOLPIDEM TARTRATE 5 MG PO TABS: 5.0000 mg | ORAL_TABLET | Freq: Every evening | ORAL | Status: DC | PRN

## 2017-06-07 SURGICAL SUPPLY — 35 items
APL SKNCLS STERI-STRIP NONHPOA (GAUZE/BANDAGES/DRESSINGS) ×1
BENZOIN TINCTURE PRP APPL 2/3 (GAUZE/BANDAGES/DRESSINGS) ×2 IMPLANT
CHLORAPREP W/TINT 26ML (MISCELLANEOUS) ×3 IMPLANT
CLAMP CORD UMBIL (MISCELLANEOUS) IMPLANT
CLOSURE WOUND 1/2 X4 (GAUZE/BANDAGES/DRESSINGS) ×1
CLOTH BEACON ORANGE TIMEOUT ST (SAFETY) ×3 IMPLANT
DRSG OPSITE POSTOP 4X10 (GAUZE/BANDAGES/DRESSINGS) ×3 IMPLANT
ELECT REM PT RETURN 9FT ADLT (ELECTROSURGICAL) ×3
ELECTRODE REM PT RTRN 9FT ADLT (ELECTROSURGICAL) ×1 IMPLANT
EXTRACTOR VACUUM M CUP 4 TUBE (SUCTIONS) IMPLANT
EXTRACTOR VACUUM M CUP 4' TUBE (SUCTIONS)
GAUZE SPONGE 4X4 12PLY STRL LF (GAUZE/BANDAGES/DRESSINGS) ×4 IMPLANT
GLOVE BIOGEL PI IND STRL 7.0 (GLOVE) ×3 IMPLANT
GLOVE BIOGEL PI INDICATOR 7.0 (GLOVE) ×6
GLOVE ECLIPSE 7.0 STRL STRAW (GLOVE) ×3 IMPLANT
GOWN STRL REUS W/TWL LRG LVL3 (GOWN DISPOSABLE) ×6 IMPLANT
KIT ABG SYR 3ML LUER SLIP (SYRINGE) IMPLANT
NDL HYPO 25X5/8 SAFETYGLIDE (NEEDLE) ×1 IMPLANT
NEEDLE HYPO 22GX1.5 SAFETY (NEEDLE) ×3 IMPLANT
NEEDLE HYPO 25X5/8 SAFETYGLIDE (NEEDLE) ×3 IMPLANT
NS IRRIG 1000ML POUR BTL (IV SOLUTION) ×3 IMPLANT
PACK C SECTION WH (CUSTOM PROCEDURE TRAY) ×3 IMPLANT
PAD ABD 7.5X8 STRL (GAUZE/BANDAGES/DRESSINGS) ×3 IMPLANT
PAD OB MATERNITY 4.3X12.25 (PERSONAL CARE ITEMS) ×3 IMPLANT
PENCIL SMOKE EVAC W/HOLSTER (ELECTROSURGICAL) ×3 IMPLANT
RTRCTR C-SECT PINK 25CM LRG (MISCELLANEOUS) IMPLANT
STRIP CLOSURE SKIN 1/2X4 (GAUZE/BANDAGES/DRESSINGS) ×1 IMPLANT
SUT PDS AB 0 CTX 36 PDP370T (SUTURE) IMPLANT
SUT PLAIN 2 0 XLH (SUTURE) IMPLANT
SUT VIC AB 0 CTX 36 (SUTURE) ×9
SUT VIC AB 0 CTX36XBRD ANBCTRL (SUTURE) ×2 IMPLANT
SUT VIC AB 4-0 KS 27 (SUTURE) ×3 IMPLANT
SYR CONTROL 10ML LL (SYRINGE) ×3 IMPLANT
TOWEL OR 17X24 6PK STRL BLUE (TOWEL DISPOSABLE) ×3 IMPLANT
TRAY FOLEY BAG SILVER LF 14FR (SET/KITS/TRAYS/PACK) ×3 IMPLANT

## 2017-06-07 NOTE — Op Note (Signed)
[name] PROCEDURE DATE: 06/07/2017  PREOPERATIVE DIAGNOSES: Intrauterine pregnancy at 4916w4d weeks gestation; chorioamnionitis, failure to progress: arrest of descent and failed induction of labor for gestational diabetes  POSTOPERATIVE DIAGNOSES: The same  PROCEDURE: Primary Low Transverse Cesarean Section  SURGEON:  Dr. Jaynie CollinsUgonna Iyani Dresner  ASSISTANT:  Dr. Caryl AdaJazma Phelps  ANESTHESIOLOGY TEAM: Anesthesiologist: Marcene DuosFitzgerald, Robert, MD; Lewie LoronGermeroth, John, MD; Jairo BenJackson, Carswell, MD CRNA: Shanon PayorGregory, Suzanne M, CRNA; Elgie CongoMalinova, Nataliya H, CRNA  INDICATIONS: Cheryl Mullen is a 27 y.o. G1P1001 at 3616w4d here for cesarean section secondary to the indications listed under preoperative diagnoses; please see preoperative note for further details.  The risks of cesarean section were discussed with the patient including but were not limited to: bleeding which may require transfusion or reoperation; infection which may require antibiotics; injury to bowel, bladder, ureters or other surrounding organs; injury to the fetus; need for additional procedures including hysterectomy in the event of a life-threatening hemorrhage; placental abnormalities wth subsequent pregnancies, incisional problems, thromboembolic phenomenon and other postoperative/anesthesia complications.   The patient concurred with the proposed plan, giving informed written consent for the procedure.    FINDINGS:  Viable female infant in cephalic presentation.  Apgars 2/7/8. Loose nuchal cord x 1. Clear amniotic fluid.  Intact placenta, three vessel cord.  Normal uterus, fallopian tubes and ovaries bilaterally.  ANESTHESIA:Epidural  INTRAVENOUS FLUIDS: 1800 ml   ESTIMATED BLOOD LOSS: 700 ml URINE OUTPUT:  100 ml SPECIMENS: Placenta sent to L&D COMPLICATIONS: None immediate  PROCEDURE IN DETAIL:  The patient preoperatively received intravenous antibiotics and had sequential compression devices applied to her lower extremities.  She was then  taken to the operating room where the epidural anesthesia was dosed up to surgical level and was found to be adequate. She was then placed in a dorsal supine position with a leftward tilt, and prepped and draped in a sterile manner.  A foley catheter was placed into her bladder and attached to constant gravity.  After an adequate timeout was performed, a Pfannenstiel skin incision was made with scalpel and carried through to the underlying layer of fascia. The fascia was incised in the midline, and this incision was extended bilaterally using the Mayo scissors.  Kocher clamps were applied to the superior aspect of the fascial incision and the underlying rectus muscles were dissected off bluntly.  A similar process was carried out on the inferior aspect of the fascial incision. The rectus muscles were separated in the midline bluntly and the peritoneum was entered bluntly. Attention was turned to the lower uterine segment where a low transverse hysterotomy was made with a scalpel and extended bilaterally bluntly.  The infant was successfully delivered, the cord was clamped and cut, and the infant was handed over to the awaiting neonatology team. Uterine massage was then administered, and the placenta delivered intact with a three-vessel cord. The uterus was then cleared of clots and debris.  The hysterotomy was closed with 0 Vicryl in a running locked fashion, and an imbricating layer was also placed with 0 Vicryl.  Figure-of-eight 0 Vicryl serosal stitches were placed to help with hemostasis.  The pelvis was cleared of all clot and debris. Hemostasis was confirmed on all surfaces.  The peritoneum and the rectus muscles were reapproximated using 0 Vicryl interrupted stitches. The fascia was then closed using 0 PDS in a running fashion.  The subcutaneous layer was irrigated, then reapproximated with 2-0 plain gut interrupted stitches, and 30 ml of 0.5% Marcaine was injected subcutaneously around the incision.  The  skin was closed with a 4-0 Vicryl subcuticular stitch. The patient tolerated the procedure well. Sponge, lap, instrument and needle counts were correct x 3.  She was taken to the recovery room in stable condition.    Jaynie Collins, MD, FACOG Obstetrician & Gynecologist, [facility] for [employer], [facility]

## 2017-06-07 NOTE — Progress Notes (Signed)
Labor Progress Note Cheryl Mullen is a 27 y.o. G1P0 at 2581w4d presented for PROM on 3/2.  S: Has been pushing for over three hours  O:  BP 136/82   Pulse (!) 111   Temp (!) 101.1 F (38.4 C) (Oral)   Resp 18   Ht 5' (1.524 m)   Wt 236 lb 12 oz (107.4 kg)   LMP 09/04/2016 (Exact Date)   SpO2 100%   BMI 46.24 kg/m   WUX:LKGMWNUUFHT:baseline rate 180s, mod variability, + accels, + decels (variables) Toco: ctx every 2-4 min, regular  CVE: Dilation: 10 Dilation Complete Date: 06/07/17 Dilation Complete Time: 1405 Effacement (%): 100 Cervical Position: Middle Station: 0 Presentation: Vertex Exam by:: Lowery Paullin  Significant caput and molding   A&P: 27 y.o. G1P0 3481w4d here for PROM. Now in second stage of labor with arrest of descent.  Reassuring FWB, CBGs stable.  Recommended cesarean section. The risks of cesarean section discussed with the patient included but were not limited to: bleeding which may require transfusion or reoperation; infection which may require antibiotics; injury to bowel, bladder, ureters or other surrounding organs; injury to the fetus; need for additional procedures including hysterectomy in the event of a life-threatening hemorrhage; placental abnormalities wth subsequent pregnancies, incisional problems, thromboembolic phenomenon and other postoperative/anesthesia complications. The patient concurred with the proposed plan, giving informed written consent for the procedure. Anesthesia and OR aware. Preoperative prophylactic antibiotics (Cefotetan and Azithromycin) and SCDs ordered on call to the OR.  To OR when ready.   Jaynie CollinsUgonna Berdene Askari, MD 5:28 PM

## 2017-06-07 NOTE — Transfer of Care (Signed)
Immediate Anesthesia Transfer of Care Note  Patient: Cheryl Mullen  Procedure(s) Performed: CESAREAN SECTION (N/A )  Patient Location: PACU  Anesthesia Type:Epidural  Level of Consciousness: awake, alert  and oriented  Airway & Oxygen Therapy: Patient Spontanous Breathing  Post-op Assessment: Report given to RN and Post -op Vital signs reviewed and stable  Post vital signs: Reviewed and stable  Last Vitals:  Vitals:   06/07/17 1650 06/07/17 1700  BP:    Pulse:    Resp:  18  Temp: (!) 38.4 C   SpO2:      Last Pain:  Vitals:   06/07/17 1650  TempSrc: Oral  PainSc:       Patients Stated Pain Goal: 3 (06/07/17 0146)  Complications: No apparent anesthesia complications

## 2017-06-07 NOTE — Anesthesia Postprocedure Evaluation (Signed)
Anesthesia Post Note  Patient: Electrical engineerJamelia Malachi  Procedure(s) Performed: CESAREAN SECTION (N/A )     Patient location during evaluation: PACU Anesthesia Type: Epidural Level of consciousness: awake and alert, patient cooperative and oriented Pain management: pain level controlled Vital Signs Assessment: post-procedure vital signs reviewed and stable Respiratory status: spontaneous breathing, nonlabored ventilation and respiratory function stable Cardiovascular status: blood pressure returned to baseline and stable Postop Assessment: no headache, epidural receding and no apparent nausea or vomiting Anesthetic complications: no    Last Vitals:  Vitals:   06/07/17 1930 06/07/17 1945  BP: (!) 141/88 (!) 143/87  Pulse: 100 (!) 106  Resp: 16 15  Temp: 37.7 C (!) 38 C  SpO2: 97% 97%    Last Pain:  Vitals:   06/07/17 2000  TempSrc:   PainSc: Asleep   Pain Goal: Patients Stated Pain Goal: 3 (06/07/17 0146)               Erling CruzJACKSON,E. Johnathon Mittal

## 2017-06-07 NOTE — Progress Notes (Addendum)
Labor Progress Note Serena ColonelJamelia Malachi is a 27 y.o. G1P0 at 5770w4d presented for PROM.  S: Patient uncomfortable with cervical exam.  O:  BP 123/72   Pulse (!) 121   Temp 99.2 F (37.3 C) (Oral)   Resp 20   Ht 5' (1.524 m)   Wt 236 lb 12 oz (107.4 kg)   LMP 09/04/2016 (Exact Date)   SpO2 100%   BMI 46.24 kg/m   ZOX:WRUEAVWUFHT:baseline rate 150, minimal variability, + acels, no decels Toco: ctx every 3-5 min, irregular  CVE: Dilation: 7 Effacement (%): 90 Cervical Position: Middle Station: -2 Presentation: Vertex Exam by:: Degele  A&P: 27 y.o. G1P0 10970w4d here for PROM. #Labor: Slow cervical change. Possible asynclitic fetal head vs. CPD with persistent cervical lip.  #Pain: Epidural #FWB: Cat I #GBS negative #GDM: CBGs mostly normal, continue to monitor.   Ellwood DenseAlison Kamelia Lampkins, DO 4:45 AM

## 2017-06-07 NOTE — Progress Notes (Signed)
Labor Progress Note Cheryl Mullen is a 27 y.o. G1P0 at 7017w4d presented for PROM on 3/2.  S: Has been pushing for about one hours  O:  BP 123/65   Pulse (!) 109   Temp 98.9 F (37.2 C) (Oral)   Resp 18   Ht 5' (1.524 m)   Wt 236 lb 12 oz (107.4 kg)   LMP 09/04/2016 (Exact Date)   SpO2 100%   BMI 46.24 kg/m   ZOX:WRUEAVWUFHT:baseline rate 180s, mod variability, + accels, + decels (variables) Toco: ctx every 2-5 min, regular  CVE: Dilation: 10 Dilation Complete Date: 06/07/17 Dilation Complete Time: 1405 Effacement (%): 100 Cervical Position: Middle Station: 0 Presentation: Vertex Exam by:: Taniaya Rudder     A&P: 27 y.o. G1P0 5317w4d here for PROM. Now in second stage of labor  #Labor: Not much descent yet. Continue pitocin and pushing. Possible asynclitic fetal head vs. CPD #Triple I: Continue Amp/Gent. Fetal tachycardia noted.   #Pain: Epidural #FWB: Overall reassuring #GBS negative #GDM: CBGs stable  Jaynie CollinsUgonna Shazia Mitchener, MD 3:37 PM

## 2017-06-07 NOTE — Consult Note (Signed)
[facility]  Delivery Note:  C-section       06/07/2017  5:39 PM  I was called to the operating room at the request of the patient's obstetrician (Dr. Macon LargeAnyanwu) for a primary c-section.  PRENATAL HX:  This is a 27 y/o G1P0 who was admitted yesterday for SROM at 2200 on 3/2 (ROM x44h).  Her pregnancy has been complicated by GDM for which she is on glyburide.  Delivery is by c-section for failure to progress.  She is GBS negative but has been febrile as high as 38.5 prior to delivery.  For her fever, she received Ampicillin and Gentamicin starting ~21 hours prior to delivery.    DELIVERY:  Cord clamping delayed for one minute, during which time infant had good tone but poor cry.  Upon arrival to bed, HR was ~ 90 bmp despite standard warming, drying and stimulation.  PPV was initiated x 30 seconds.  HR improved to ~ 100 but she continued to have poor respiratory effort, so PPV was given for another 30 seconds.  HR improved to 120, but she O2 saturations were in 60s and she had increased work of breathing with retractions.  She was given CPAP with FiO2 titrated for minute appropriate saturations, with maximum FiO2 50%.  She was transported to the NICU on CPAP, with weaning FiO2.  APGARs 2, 7 and 8.  Exam notable for molding, caput, and tachypnea.  Given increased risk of sepsis (prolonged ROM and maternal fever) and respiratory distress, she was admitted to the NICU for respiratory support and a further sepsis evaluation.    _____________________ Electronically Signed By: Maryan CharLindsey Chloie Loney, MD Neonatologist

## 2017-06-07 NOTE — Progress Notes (Signed)
Labor Progress Note Cheryl Mullen is a 27 y.o. G1P0 at 4973w4d presented for PROM on 3/2.  S: Comfortable with epidural  O:  BP (!) 143/62   Pulse (!) 101   Temp 98.9 F (37.2 C) (Oral)   Resp 18   Ht 5' (1.524 m)   Wt 107.4 kg (236 lb 12 oz)   LMP 09/04/2016 (Exact Date)   SpO2 100%   BMI 46.24 kg/m   WNU:UVOZDGUYFHT:baseline rate 150, min/mod variability, + accels, no decels Toco: ctx every 2-5 min, regular  CVE: Dilation: 9 Effacement (%): 100 Cervical Position: Middle Station: -1, 0 Presentation: Vertex Exam by:: [name], rn     A&P: 27 y.o. G1P0 1673w4d here for PROM. Active labor  #Labor: Slow cervical change. Continue pitocin. #Triple I: Continue Amp/Gent. VSS.  #Pain: Epidural #FWB: Cat I #GBS negative #GDM: CBGs mostly normal, continue to monitor.   Caryl AdaJazma Phelps, DO 10:44 AM

## 2017-06-07 NOTE — Progress Notes (Signed)
Labor Progress Note Serena ColonelJamelia Malachi is a 27 y.o. G1P0 at 4445w4d presented for PROM on 3/2.  S: Comfortable with epidural. No concerns. Starting to push.  O:  BP 126/83   Pulse (!) 105   Temp 99.1 F (37.3 C) (Oral)   Resp 18   Ht 5' (1.524 m)   Wt 107.4 kg (236 lb 12 oz)   LMP 09/04/2016 (Exact Date)   SpO2 100%   BMI 46.24 kg/m   BJY:NWGNFAOZFHT:baseline rate 150, mod variability, + accels, + decels (variables) Toco: ctx every 2-5 min, regular  CVE: Dilation: 10 Dilation Complete Date: 06/07/17 Dilation Complete Time: 1405 Effacement (%): 100 Cervical Position: Middle Station: 0 Presentation: Vertex Exam by:: [email or web address]     A&P: 27 y.o. G1P0 9345w4d here for PROM. Active labor  #Labor: Slow cervical change. Continue pitocin. Start practice pushing. Possible asynclitic fetal head vs. CPD #Triple I: Continue Amp/Gent. VSS.  #Pain: Epidural #FWB: Cat II #GBS negative #GDM: CBGs stable  Caryl AdaJazma Phelps, DO 2:33 PM

## 2017-06-08 ENCOUNTER — Other Ambulatory Visit: Payer: Medicaid Other

## 2017-06-08 ENCOUNTER — Encounter (HOSPITAL_COMMUNITY): Payer: Self-pay | Admitting: *Deleted

## 2017-06-08 LAB — CREATININE, SERUM
CREATININE: 2.04 mg/dL — AB (ref 0.44–1.00)
GFR calc Af Amer: 38 mL/min — ABNORMAL LOW (ref >60)
GFR calc non Af Amer: 33 mL/min — ABNORMAL LOW (ref >60)

## 2017-06-08 LAB — CBC
HCT: 32.7 % — ABNORMAL LOW (ref 36.0–46.0)
HEMOGLOBIN: 11.5 g/dL — AB (ref 12.0–15.0)
MCH: 29.3 pg (ref 26.0–34.0)
MCHC: 35.2 g/dL (ref 30.0–36.0)
MCV: 83.4 fL (ref 78.0–100.0)
Platelets: 216 10*3/uL (ref 150–400)
RBC: 3.92 MIL/uL (ref 3.87–5.11)
RDW: 14.6 % (ref 11.5–15.5)
WBC: 16.9 10*3/uL — ABNORMAL HIGH (ref 4.0–10.5)

## 2017-06-08 LAB — GLUCOSE, CAPILLARY
GLUCOSE-CAPILLARY: 91 mg/dL (ref 65–99)
GLUCOSE-CAPILLARY: 99 mg/dL (ref 65–99)

## 2017-06-08 MED ORDER — SODIUM CHLORIDE 0.9% FLUSH: 3.0000 mL | INTRAVENOUS | Status: DC | PRN

## 2017-06-08 MED ORDER — NALOXONE HCL 4 MG/10ML IJ SOLN
1.0000 ug/kg/h | INTRAVENOUS | Status: DC | PRN
  Filled 2017-06-08: qty 5

## 2017-06-08 MED ORDER — DIPHENHYDRAMINE HCL 50 MG/ML IJ SOLN
12.5000 mg | INTRAMUSCULAR | Status: DC | PRN
Start: 2017-06-08 — End: 2017-06-09

## 2017-06-08 MED ORDER — IBUPROFEN 600 MG PO TABS: 600.0000 mg | ORAL_TABLET | Freq: Four times a day (QID) | ORAL | 0 refills | Status: DC

## 2017-06-08 MED ORDER — NALOXONE HCL 0.4 MG/ML IJ SOLN: 0.4000 mg | INTRAMUSCULAR | Status: DC | PRN

## 2017-06-08 MED ORDER — ACETAMINOPHEN 500 MG PO TABS
1000.0000 mg | ORAL_TABLET | Freq: Four times a day (QID) | ORAL | Status: AC
  Administered 2017-06-08 (×2): 1000 mg via ORAL
  Filled 2017-06-08 (×2): qty 2

## 2017-06-08 MED ORDER — NALBUPHINE HCL 10 MG/ML IJ SOLN: 5.0000 mg | Freq: Once | INTRAMUSCULAR | Status: DC | PRN

## 2017-06-08 MED ORDER — ONDANSETRON HCL 4 MG/2ML IJ SOLN: 4.0000 mg | Freq: Three times a day (TID) | INTRAMUSCULAR | Status: DC | PRN

## 2017-06-08 MED ORDER — SCOPOLAMINE 1 MG/3DAYS TD PT72
1.0000 | MEDICATED_PATCH | Freq: Once | TRANSDERMAL | Status: DC
  Filled 2017-06-08: qty 1

## 2017-06-08 MED ORDER — DIPHENHYDRAMINE HCL 25 MG PO CAPS: 25.0000 mg | ORAL_CAPSULE | ORAL | Status: DC | PRN

## 2017-06-08 MED ORDER — KETOROLAC TROMETHAMINE 30 MG/ML IJ SOLN
30.0000 mg | Freq: Four times a day (QID) | INTRAMUSCULAR | Status: AC | PRN
  Administered 2017-06-08: 30 mg via INTRAVENOUS
  Filled 2017-06-08: qty 1

## 2017-06-08 MED ORDER — NALBUPHINE HCL 10 MG/ML IJ SOLN: 5.0000 mg | INTRAMUSCULAR | Status: DC | PRN

## 2017-06-08 MED ORDER — KETOROLAC TROMETHAMINE 30 MG/ML IJ SOLN: 30.0000 mg | Freq: Four times a day (QID) | INTRAMUSCULAR | Status: AC | PRN

## 2017-06-08 NOTE — Progress Notes (Signed)
Subjective: Postpartum Day 1: Cesarean Delivery Patient reports tolerating PO, + flatus and no problems voiding.    Objective: Vital signs in last 24 hours: Temp:  [98.8 F (37.1 C)-101.1 F (38.4 C)] 99.1 F (37.3 C) (03/05 0355) Pulse Rate:  [85-153] 109 (03/05 0355) Resp:  [15-26] 18 (03/05 0355) BP: (109-147)/(55-95) 124/69 (03/05 0355) SpO2:  [92 %-100 %] 95 % (03/05 0555)  Physical Exam:  General: alert, cooperative and no distress Lochia: appropriate Uterine Fundus: firm Incision: no significant drainage, no dehiscence DVT Evaluation: No evidence of DVT seen on physical exam.  Recent Labs    06/06/17 0935 06/08/17 0514  HGB 13.9 11.5*  HCT 39.9 32.7*    Assessment/Plan: Status post Cesarean section. Doing well postoperatively.  Continue current care.  Rolm BookbinderCaroline M Neill CNM 06/08/2017, 7:09 AM

## 2017-06-08 NOTE — Discharge Summary (Signed)
OB Discharge Summary     Patient Name: Cheryl Mullen DOB: Apr 08, 1990 MRN: [id number] Date of admission: 06/06/2017  Delivering MD: Jaynie Collins A )  Date of discharge: 06/09/2017    Admitting diagnosis: PROM Intrauterine pregnancy: [redacted]w[redacted]d    Secondary diagnosis:  Active Problems:   Patient Active Problem List   Diagnosis Date Noted  . Normal labor and delivery 06/06/2017  . Maternal obesity, antepartum 04/16/2017  . Gestational diabetes 04/16/2017  . Sickle cell trait (HCC) 02/19/2017  . 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 Trichomoniasis 01/15/2017  . LGSIL of cervix of undetermined significance 01/15/2017  . Supervision of normal first pregnancy, antepartum 12/17/2016  . IDA (iron deficiency anemia) 07/19/2015  . Menorrhagia with irregular cycle 07/19/2015  . Panic 05/31/2015  . Obesity (BMI 30-39.9) 05/17/2013  . Eczema 05/17/2013  . Discoloration of skin 01/17/2013    Additional problems: none     Discharge diagnosis: Term Pregnancy Delivered and GDM A2                                                                                                Post partum procedures:none  Complications: Intrauterine Inflammation or infection (Chorioamniotis)  Hospital course:  Induction of Labor With Cesarean Section  27 y.o. yo G1P1001 at [redacted]w[redacted]d was admitted to the hospital 06/06/2017 for induction of labor. Patient had a labor course significant for arrest of descent in 2nd stage of labor. The patient went for cesarean section due to Arrest of Descent, and delivered a Viable infant,06/07/2017  Membrane Rupture Time/Date: 10:30 PM ,06/05/2017   Details of operation can be found in separate operative Note.  Patient had an uncomplicated postpartum course. She is ambulating, tolerating a regular diet, passing flatus, and urinating well.  Patient is discharged home in stable condition on 06/09/17.                                    Physical exam  Vitals:   06/08/17 1434 06/08/17 1650  BP:   122/73  Pulse:  90  Resp:  18  Temp:  97.7 F (36.5 C)  SpO2: 98% 98%    General: alert, cooperative and no distress Lochia: appropriate Uterine Fundus: firm Incision: Healing well with no significant drainage, No significant erythema, Dressing is clean, dry, and intact DVT Evaluation: No evidence of DVT seen on physical exam.  Labs: No results found for this or any previous visit (from the past 24 hour(s)).   Discharge instruction: per After Visit Summary and "Baby and Me Booklet".  After visit meds:  Allergies  Allergen Reactions  . Nickel Hives    Allergies as of 06/09/2017      Reactions   Nickel Hives      Medication List    STOP taking these medications   ACCU-CHEK FASTCLIX LANCETS Misc   doxylamine (Sleep) 25 MG tablet Commonly known as:  UNISOM   glucose blood test strip Commonly known as:  ACCU-CHEK GUIDE   glyBURIDE 2.5 MG tablet Commonly known as:  DIABETA   montelukast  10 MG tablet Commonly known as:  SINGULAIR   triamcinolone ointment 0.1 % Commonly known as:  KENALOG     TAKE these medications   ibuprofen 600 MG tablet Commonly known as:  ADVIL,MOTRIN Take 1 tablet (600 mg total) by mouth every 6 (six) hours.   PRENATAL GUMMIES/DHA & FA 0.4-32.5 MG Chew Chew 1 tablet by mouth daily.        Diet: routine diet  Activity: Advance as tolerated. Pelvic rest for 6 weeks.   Outpatient follow up:1 week Future Appointments:  Future Appointments  Date Time Provider Department Center  06/24/2017 10:30 AM Levie HeritageStinson, Jacob J, DO [facility] None  07/15/2017 10:30 AM Levie HeritageStinson, Jacob J, DO [facility] None  10/15/2017 10:45 AM [facility] None  10/15/2017 11:15 AM [place], Brand MalesSarah M, NP [facility] None    Follow up Appt: No Follow-up on file.     Postpartum contraception: undecided  Newborn Data: APGAR (1 MIN): 2   APGAR (5 MINS): 7     Baby Feeding: Breast Disposition:home with mother  Rolm Bookbindermber Agron Swiney, DO  06/09/2017

## 2017-06-08 NOTE — Addendum Note (Signed)
Addendum  created 06/08/17 1659 by Algis GreenhouseBurger, Gavriel Holzhauer A, CRNA   Sign clinical note

## 2017-06-08 NOTE — Anesthesia Postprocedure Evaluation (Signed)
Anesthesia Post Note  Patient: Cheryl Mullen  Procedure(s) Performed: CESAREAN SECTION (N/A )     Patient location during evaluation: Mother Baby Anesthesia Type: Epidural Level of consciousness: awake Pain management: satisfactory to patient Vital Signs Assessment: post-procedure vital signs reviewed and stable Respiratory status: spontaneous breathing Cardiovascular status: stable Anesthetic complications: no    Last Vitals:  Vitals:   06/08/17 1224 06/08/17 1434  BP: 113/64   Pulse: 87   Resp: 18   Temp: (!) 36.4 C   SpO2: 99% 98%    Last Pain:  Vitals:   06/08/17 1224  TempSrc: Oral  PainSc:    Pain Goal: Patients Stated Pain Goal: 3 (06/07/17 0146)               Cephus ShellingBURGER,Soham Hollett

## 2017-06-08 NOTE — Discharge Instructions (Signed)

## 2017-06-09 ENCOUNTER — Ambulatory Visit: Payer: Self-pay

## 2017-06-09 NOTE — Plan of Care (Signed)
POC discussed with pt, including pain management and pumping.  No questions or concerns at this time.

## 2017-06-09 NOTE — Lactation Note (Addendum)
This note was copied from a baby's chart. Lactation Consultation Note  Patient Name: Cheryl Serena ColonelJamelia Malachi WUJWJ'XToday's Date: 06/09/2017 Reason for consult: Initial assessment;NICU baby;Early term 37-38.6wks;Primapara;Other (Comment)(WIC loaner [name] )  Baby is 45 hours.  Per mom has been pumping regularly and still no milk and using the #24 Flange and its comfortable.  LC reviewed supply and demand and the importance of continuing to be consistent with pumping at least 8 - 10 x's in 24 hours for 15 -20 mins . LC reviewed the set up of the DEBP and both phases.  LC mentioned to mom when  Sore nipple and engorgement prevention and tx reviewed.  [name] , and the process followed.  [name] request to [place] [facility] this afternoon and mom aware to call WIC in [place] today and leave a  [facility].  Mother informed of post-discharge support and given phone number to the lactation department, including services for phone call assistance; out-patient appointments; and breastfeeding support group. List of other breastfeeding resources in the community given in the handout. Encouraged mother to call for problems or concerns related to breastfeeding. Per mom while visiting the baby in NICU the baby was rooting so she latched her.       Maternal Data Does the patient have breastfeeding experience prior to this delivery?: No  Feeding Feeding Type: Formula Length of feed: 30 min  LATCH Score                   Interventions Interventions: Breast feeding basics reviewed;DEBP  Lactation Tools Discussed/Used Tools: Pump;Flanges Flange Size: 24;27;Other (comment)(per mom using the #61F and LC mentioned when her milk comes in may need #27 ) Breast pump type: Double-Electric Breast Pump WIC Program: Yes Pump Review: Setup, frequency, and cleaning;Milk Storage Initiated by:: MAI / Reviewed /  Date initiated::  06/09/17   Consult Status Consult Status: PRN Follow-up type: Other (comment)(baby in NICU )    Matilde SprangMargaret Ann Sartaj Hoskin 06/09/2017, 4:40 PM

## 2017-06-09 NOTE — Anesthesia Postprocedure Evaluation (Signed)
Anesthesia Post Note  Patient: Cheryl Mullen  Procedure(s) Performed: CESAREAN SECTION (N/A )     Patient location during evaluation: Mother Baby Anesthesia Type: Epidural Level of consciousness: awake, awake and alert and oriented Pain management: pain level controlled Vital Signs Assessment: post-procedure vital signs reviewed and stable Respiratory status: spontaneous breathing, nonlabored ventilation and respiratory function stable Cardiovascular status: stable Postop Assessment: no headache, no backache, patient able to bend at knees, no apparent nausea or vomiting and adequate PO intake Anesthetic complications: no    Last Vitals:  Vitals:   06/08/17 1434 06/08/17 1650  BP:  122/73  Pulse:  90  Resp:  18  Temp:  36.5 C  SpO2: 98% 98%    Last Pain:  Vitals:   06/09/17 0751  TempSrc:   PainSc: 9    Pain Goal: Patients Stated Pain Goal: 3 (06/07/17 0146)               Chanse Kagel

## 2017-06-11 ENCOUNTER — Encounter: Payer: Medicaid Other | Admitting: Family Medicine

## 2017-06-11 ENCOUNTER — Ambulatory Visit (HOSPITAL_COMMUNITY): Payer: Medicaid Other

## 2017-06-15 ENCOUNTER — Encounter: Payer: Medicaid Other | Admitting: Advanced Practice Midwife

## 2017-06-15 ENCOUNTER — Other Ambulatory Visit: Payer: Medicaid Other

## 2017-06-16 ENCOUNTER — Encounter: Payer: Self-pay | Admitting: Family Medicine

## 2017-06-17 ENCOUNTER — Inpatient Hospital Stay (HOSPITAL_COMMUNITY): Payer: Medicaid Other

## 2017-06-18 ENCOUNTER — Ambulatory Visit (INDEPENDENT_AMBULATORY_CARE_PROVIDER_SITE_OTHER): Payer: Medicaid Other | Admitting: Obstetrics & Gynecology

## 2017-06-18 ENCOUNTER — Encounter: Payer: Self-pay | Admitting: Obstetrics & Gynecology

## 2017-06-18 ENCOUNTER — Encounter: Payer: Medicaid Other | Admitting: Obstetrics & Gynecology

## 2017-06-18 DIAGNOSIS — O165 Unspecified maternal hypertension, complicating the puerperium: Secondary | ICD-10-CM

## 2017-06-18 MED ORDER — AMLODIPINE BESYLATE 5 MG PO TABS: 5.0000 mg | ORAL_TABLET | Freq: Every day | ORAL | 1 refills | Status: DC

## 2017-06-18 NOTE — Progress Notes (Signed)
History:  27 y.o. G1P1001 here today for f/u BP and Creatinine. Pt reports being on multiple meds while in labor. She reports that her BP was fine while in the hosp.  She denies HA or visual changes.  On chart review it does not apper that pt was on Magnesium or other BP meds. She does report that she had a lot of swelling that has resolved.   The following portions of the patient's history were reviewed and updated as appropriate: allergies, current medications, past family history, past medical history, past social history, past surgical history and problem list.  Review of Systems:  Pertinent items are noted in HPI.   Objective:  Physical Exam Blood pressure (!) 153/98, pulse 87, weight 215 lb (97.5 kg), unknown if currently breastfeeding.  CONSTITUTIONAL: Well-developed, well-nourished female in no acute distress.  HENT:  Normocephalic, atraumatic EYES: Conjunctivae and EOM are normal. No scleral icterus.  NECK: Normal range of motion SKIN: Skin is warm and dry. No rash noted. Not diaphoretic.No pallor. NEUROLGIC: Alert and oriented to person, place, and time. Normal coordination.   Labs and Imaging BMP Latest Ref Rng & Units 06/08/2017 12/17/2016 07/03/2015  Glucose 65 - 99 mg/dL - 96 93  BUN 6 - 23 mg/dL - - 9  Creatinine 4.090.44 - 1.00 mg/dL 8.11(B2.04(H) - 1.470.93  Sodium 135 - 145 mEq/L - - 137  Potassium 3.5 - 5.1 mEq/L - - 4.1  Chloride 96 - 112 mEq/L - - 104  CO2 19 - 32 mEq/L - - 25  Calcium 8.4 - 10.5 mg/dL - - 9.1    Assessment & Plan:   Elevated BP PP and elevated Cr.    Repeat BMP  Norvasc 5 mg po q day  F/u in 1 week for repeat BP  Alycen Mack L. Harraway-Smith, M.D., Evern CoreFACOG

## 2017-06-18 NOTE — Progress Notes (Signed)
Dr. Erin FullingHarraway Smith reviewed BP and spoke with patient about starting her on Norvasc. Armandina StammerJennifer Howard RM

## 2017-06-19 LAB — BASIC METABOLIC PANEL
BUN/Creatinine Ratio: 10 (ref 9–23)
BUN: 22 mg/dL — ABNORMAL HIGH (ref 6–20)
CHLORIDE: 104 mmol/L (ref 96–106)
CO2: 17 mmol/L — ABNORMAL LOW (ref 20–29)
CREATININE: 2.15 mg/dL — AB (ref 0.57–1.00)
Calcium: 9.4 mg/dL (ref 8.7–10.2)
GFR calc non Af Amer: 31 mL/min/{1.73_m2} — ABNORMAL LOW (ref >59)
GFR, EST AFRICAN AMERICAN: 36 mL/min/{1.73_m2} — AB (ref >59)
GLUCOSE: 94 mg/dL (ref 65–99)
Potassium: 4.8 mmol/L (ref 3.5–5.2)
SODIUM: 142 mmol/L (ref 134–144)

## 2017-06-19 LAB — CREATININE, SERUM
Creatinine, Ser: 2.28 mg/dL — ABNORMAL HIGH (ref 0.57–1.00)
GFR calc Af Amer: 33 mL/min/{1.73_m2} — ABNORMAL LOW (ref >59)
GFR calc non Af Amer: 29 mL/min/{1.73_m2} — ABNORMAL LOW (ref >59)

## 2017-06-24 ENCOUNTER — Encounter: Payer: Self-pay | Admitting: Family Medicine

## 2017-06-24 ENCOUNTER — Ambulatory Visit (INDEPENDENT_AMBULATORY_CARE_PROVIDER_SITE_OTHER): Payer: Medicaid Other | Admitting: Family Medicine

## 2017-06-24 DIAGNOSIS — O24410 Gestational diabetes mellitus in pregnancy, diet controlled: Secondary | ICD-10-CM

## 2017-06-24 DIAGNOSIS — O165 Unspecified maternal hypertension, complicating the puerperium: Secondary | ICD-10-CM

## 2017-06-24 DIAGNOSIS — R7989 Other specified abnormal findings of blood chemistry: Secondary | ICD-10-CM

## 2017-06-24 MED ORDER — AMLODIPINE BESYLATE 10 MG PO TABS: 10.0000 mg | ORAL_TABLET | Freq: Every day | ORAL | 2 refills | Status: DC

## 2017-06-24 NOTE — Progress Notes (Signed)
   Subjective:    Patient ID: Cheryl ColonelJamelia Malachi, female    DOB: May 07, 1990, 27 y.o.   MRN: [id number]  HPI Patient doing well - no concerns today. Having increased stress: her daughter was admitted to the NICU due to low calcium and seizures. Has not been sleeping well. Has been taking BP medicine. No HA, vision changes, swelling.    Review of Systems     Objective:   Physical Exam  Constitutional: She appears well-developed and well-nourished.  Cardiovascular: Normal rate, regular rhythm and normal heart sounds.  Pulmonary/Chest: Effort normal and breath sounds normal. No respiratory distress. She has no wheezes. She has no rales.  Abdominal: Soft. Bowel sounds are normal. She exhibits no distension. There is no tenderness. There is no rebound.  Incision clean, dry, intact  Skin: Skin is warm and dry.  Psychiatric: She has a normal mood and affect. Her behavior is normal. Judgment and thought content normal.      Assessment & Plan:  1. Postpartum care and examination Normal - incision well healed  2. Elevated serum creatinine Recheck creatinine. If not improving, may need to refer to Nephrology - Creatinine  3. Postpartum hypertension No symptoms. Increase norvasc to 10mg  daily - amLODipine (NORVASC) 10 MG tablet; Take 1 tablet (10 mg total) by mouth daily.  Dispense: 30 tablet; Refill: 2  4. Diet controlled gestational diabetes mellitus (GDM) in third trimester Will need PP 2hr GTT.

## 2017-06-25 ENCOUNTER — Encounter: Payer: Self-pay | Admitting: Family Medicine

## 2017-06-25 LAB — CREATININE, SERUM
CREATININE: 1.77 mg/dL — AB (ref 0.57–1.00)
GFR, EST AFRICAN AMERICAN: 45 mL/min/{1.73_m2} — AB (ref >59)
GFR, EST NON AFRICAN AMERICAN: 39 mL/min/{1.73_m2} — AB (ref >59)

## 2017-06-27 NOTE — Progress Notes (Signed)
I was consulted, reviewed results and agree w/ POC.  Katrinka BlazingSmith, [place], [place]M 06/27/2017 9:34 AM

## 2017-07-15 ENCOUNTER — Encounter: Payer: Self-pay | Admitting: Family Medicine

## 2017-07-15 ENCOUNTER — Ambulatory Visit (INDEPENDENT_AMBULATORY_CARE_PROVIDER_SITE_OTHER): Payer: Medicaid Other | Admitting: Family Medicine

## 2017-07-15 ENCOUNTER — Encounter: Payer: Self-pay | Admitting: *Deleted

## 2017-07-15 ENCOUNTER — Other Ambulatory Visit: Payer: Self-pay

## 2017-07-15 DIAGNOSIS — O24415 Gestational diabetes mellitus in pregnancy, controlled by oral hypoglycemic drugs: Secondary | ICD-10-CM

## 2017-07-15 DIAGNOSIS — R7989 Other specified abnormal findings of blood chemistry: Secondary | ICD-10-CM

## 2017-07-15 DIAGNOSIS — Z1389 Encounter for screening for other disorder: Secondary | ICD-10-CM | POA: Diagnosis not present

## 2017-07-15 DIAGNOSIS — O165 Unspecified maternal hypertension, complicating the puerperium: Secondary | ICD-10-CM | POA: Insufficient documentation

## 2017-07-15 MED ORDER — NORETHINDRONE 0.35 MG PO TABS: 1.0000 | ORAL_TABLET | Freq: Every day | ORAL | 11 refills | Status: DC

## 2017-07-15 NOTE — Progress Notes (Signed)
Patient is not taking blood pressure pill. Cheryl StammerJennifer Howard RN  Post Partum Exam  Cheryl Mullen is a 27 y.o. 661P1001 female who presents for a postpartum visit. She is 4 weeks postpartum following a low cervical transverse Cesarean section. I have fully reviewed the prenatal and intrapartum course. The delivery was at 37.4 gestational weeks.  Anesthesia: epidural. Postpartum course has been uneventful. 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 Baby is feeding by Bleeding no bleeding. Bowel function is normal. Bladder function is normal. Patient is sexually active. Contraception method is OCP (estrogen/progesterone) and oral progesterone-only contraceptive. Postpartum depression screening:neg  The following portions of the patient's history were reviewed and updated as appropriate: allergies, current medications, past family history, past medical history, past social history, past surgical history and problem list. Last pap smear done 12-17-16 and was Abnormal- LGSIL can not rule out high grade  Review of Systems Pertinent items are noted in HPI.     Objective:  Blood pressure (!) 156/108, pulse 84, currently breastfeeding.  General:  alert, cooperative and no distress  Lungs: clear to auscultation bilaterally  Heart:  regular rate and rhythm, S1, S2 normal, no murmur, click, rub or gallop  Abdomen: soft, non-tender; bowel sounds normal; no masses,  no organomegaly. Small pinhole break in skin on left corner. No drainage or erythema.         Assessment:    Normal postpartum exam. Pap smear not done at today's visit.   Plan:  1. Postpartum care and examination Micronor - Creatinine - Glucose tolerance, 2 hours  2. Elevated serum creatinine Repeat today - Creatinine  3. Gestational diabetes mellitus (GDM) in third trimester controlled on oral hypoglycemic drug Check 2hr GTT - Glucose tolerance, 2 hours  4. Postpartum hypertension Patient  stopped taking amlodipine due to decreasing milk supply. Will recheck Cr - if normal, can start ACE-I, such as Vasotec. Patient to follow up with PCP

## 2017-07-15 NOTE — Telephone Encounter (Signed)
Patient states her pharmacy said her birth control is on back order.   [facility] [place] [facility] in [facility] and transferred script to them. Armandina StammerJennifer Haruo Stepanek RN

## 2017-07-22 LAB — CREATININE, SERUM
Creatinine, Ser: 1.19 mg/dL — ABNORMAL HIGH (ref 0.57–1.00)
GFR calc non Af Amer: 63 mL/min/{1.73_m2} (ref >59)
GFR, EST AFRICAN AMERICAN: 73 mL/min/{1.73_m2} (ref >59)

## 2017-07-22 LAB — GLUCOSE TOLERANCE, 2 HOURS
GLUCOSE FASTING GTT: 88 mg/dL (ref 65–99)
Glucose, 2 hour: 141 mg/dL — ABNORMAL HIGH (ref 65–139)

## 2017-07-26 ENCOUNTER — Encounter (INDEPENDENT_AMBULATORY_CARE_PROVIDER_SITE_OTHER): Payer: Self-pay

## 2017-07-26 ENCOUNTER — Other Ambulatory Visit: Payer: Self-pay | Admitting: Family Medicine

## 2017-07-26 ENCOUNTER — Encounter: Payer: Self-pay | Admitting: Family Medicine

## 2017-07-26 MED ORDER — ENALAPRIL MALEATE 5 MG PO TABS: 5.0000 mg | ORAL_TABLET | Freq: Every day | ORAL | 3 refills | Status: DC

## 2017-09-10 ENCOUNTER — Encounter: Payer: Self-pay | Admitting: Family Medicine

## 2017-09-10 ENCOUNTER — Ambulatory Visit: Payer: Medicaid Other | Admitting: Family Medicine

## 2017-09-10 VITALS — BP 131/81 | HR 99 | Temp 98.7°F | Resp 16 | Ht 60.0 in | Wt 225.2 lb

## 2017-09-10 DIAGNOSIS — L309 Dermatitis, unspecified: Secondary | ICD-10-CM

## 2017-09-10 DIAGNOSIS — D821 Di George's syndrome: Secondary | ICD-10-CM | POA: Diagnosis not present

## 2017-09-10 MED ORDER — TRIAMCINOLONE ACETONIDE 0.1 % EX OINT: 1.0000 "application " | TOPICAL_OINTMENT | Freq: Two times a day (BID) | CUTANEOUS | 3 refills | Status: DC

## 2017-09-10 NOTE — Assessment & Plan Note (Signed)
[facility] have [facility] testing Referral put in

## 2017-09-10 NOTE — Progress Notes (Signed)
Patient ID: Cheryl Mullen, female    DOB: Feb 01, 1991  Age: 27 y.o. MRN: [id number]    Subjective:  Subjective  HPI Cheryl Mullen presents with c/o exac of her eczema --- she needs a refill on her medication  She also recently gave birth to a girl who was dx with Digeorge syndrome.  The ped at [facility] advised that the entire family get genetic testing.    Review of Systems  Constitutional: Negative for chills and fever.  HENT: Negative for congestion and hearing loss.   Eyes: Negative for discharge.  Respiratory: Negative for cough and shortness of breath.   Cardiovascular: Negative for chest pain, palpitations and leg swelling.  Gastrointestinal: Negative for abdominal pain, blood in stool, constipation, diarrhea, nausea and vomiting.  Genitourinary: Negative for dysuria, frequency, hematuria and urgency.  Musculoskeletal: Negative for back pain and myalgias.  Skin: Negative for rash.  Allergic/Immunologic: Negative for environmental allergies.  Neurological: Negative for dizziness, weakness and headaches.  Hematological: Does not bruise/bleed easily.  Psychiatric/Behavioral: Negative for suicidal ideas. The patient is not nervous/anxious.     History Past Medical History:  Diagnosis Date  . Allergy   . Anemia   . Eczema   . Gestational diabetes     She has a past surgical history that includes Tonsillectomy and adenoidectomy (Bilateral, 01/02/2013); Wisdom tooth extraction; and Cesarean section (N/A, 06/07/2017).   Her family history includes Diabetes in her maternal grandfather; Hyperlipidemia in her father and mother; Hypertension in her father, maternal grandfather, maternal grandmother, mother, paternal grandfather, and paternal grandmother.She reports that she has never smoked. She has never used smokeless tobacco. She reports that she drinks alcohol. She reports that she does not use drugs.  Current Outpatient Medications on File Prior to Visit  Medication Sig Dispense  Refill  . montelukast (SINGULAIR) 10 MG tablet Take 10 mg by mouth at bedtime.    . norethindrone (ORTHO MICRONOR) 0.35 MG tablet Take 1 tablet (0.35 mg total) by mouth daily. 1 Package 11  . Prenatal MV-Min-FA-Omega-3 (PRENATAL GUMMIES/DHA & FA) 0.4-32.5 MG CHEW Chew 1 tablet by mouth daily. 90 tablet 3  . enalapril (VASOTEC) 5 MG tablet Take 1 tablet (5 mg total) by mouth daily. (Patient not taking: Reported on 09/10/2017) 30 tablet 3   No current facility-administered medications on file prior to visit.      Objective:  Objective  Physical Exam  Constitutional: She is oriented to person, place, and time. She appears well-developed and well-nourished.  HENT:  Head: Normocephalic and atraumatic.  Eyes: Conjunctivae and EOM are normal.  Neck: Normal range of motion. Neck supple. No JVD present. Carotid bruit is not present. No thyromegaly present.  Cardiovascular: Normal rate, regular rhythm and normal heart sounds.  No murmur heard. Pulmonary/Chest: Effort normal and breath sounds normal. No respiratory distress. She has no wheezes. She has no rales. She exhibits no tenderness.  Musculoskeletal: She exhibits no edema.  Neurological: She is alert and oriented to person, place, and time.  Skin: Rash noted.     Psychiatric: She has a normal mood and affect.  Nursing note and vitals reviewed.  BP 131/81 (BP Location: Left Arm, Cuff Size: Normal)   Pulse 99   Temp 98.7 F (37.1 C) (Oral)   Resp 16   Ht 5' (1.524 m)   Wt 225 lb 3.2 oz (102.2 kg)   LMP 09/03/2017   SpO2 (!) 89%   BMI 43.98 kg/m  Wt Readings from Last 3 Encounters:  09/10/17 225 lb  3.2 oz (102.2 kg)  06/24/17 212 lb (96.2 kg)  06/18/17 215 lb (97.5 kg)     Lab Results  Component Value Date   WBC 16.9 (H) 06/08/2017   HGB 11.5 (L) 06/08/2017   HCT 32.7 (L) 06/08/2017   PLT 216 06/08/2017   GLUCOSE 94 06/18/2017   ALT 16 07/03/2015   AST 17 07/03/2015   NA 142 06/18/2017   K 4.8 06/18/2017   CL 104  06/18/2017   CREATININE 1.19 (H) 07/19/2017   BUN 22 (H) 06/18/2017   CO2 17 (L) 06/18/2017   TSH 0.95 05/29/2016   HGBA1C 5.3 12/17/2016    No results found.   Assessment & Plan:  Plan  I have discontinued Cheryl Mullen "Jay"'s ibuprofen. I am also having her maintain her PRENATAL GUMMIES/DHA & FA, montelukast, norethindrone, enalapril, and triamcinolone ointment.  Meds ordered this encounter  Medications  . triamcinolone ointment (KENALOG) 0.1 %    Sig: Apply 1 application topically 2 (two) times daily.    Dispense:  80 g    Refill:  3    Problem List Items Addressed This Visit      Unprioritized   DiGeorge syndrome (HCC) - Primary    Pt daughters dr at [facility] requested entire family have genetic testing Referral put in       Relevant Orders   Ambulatory referral to Genetics   Eczema   Relevant Medications   triamcinolone ointment (KENALOG) 0.1 %      Follow-up: No follow-ups on file.  Donato Schultz, DO

## 2017-09-10 NOTE — Patient Instructions (Signed)

## 2017-09-29 ENCOUNTER — Encounter: Payer: Self-pay | Admitting: Family

## 2017-10-15 ENCOUNTER — Other Ambulatory Visit: Payer: Medicaid Other

## 2017-10-15 ENCOUNTER — Ambulatory Visit: Payer: Medicaid Other | Admitting: Family

## 2017-10-18 ENCOUNTER — Inpatient Hospital Stay (HOSPITAL_BASED_OUTPATIENT_CLINIC_OR_DEPARTMENT_OTHER): Payer: Medicaid Other | Admitting: Family

## 2017-10-18 ENCOUNTER — Inpatient Hospital Stay: Payer: Medicaid Other | Attending: Hematology & Oncology

## 2017-10-18 ENCOUNTER — Encounter: Payer: Self-pay | Admitting: Family

## 2017-10-18 ENCOUNTER — Other Ambulatory Visit: Payer: Self-pay

## 2017-10-18 VITALS — BP 131/71 | HR 84 | Temp 98.3°F | Resp 18

## 2017-10-18 DIAGNOSIS — N921 Excessive and frequent menstruation with irregular cycle: Secondary | ICD-10-CM | POA: Diagnosis not present

## 2017-10-18 DIAGNOSIS — D500 Iron deficiency anemia secondary to blood loss (chronic): Secondary | ICD-10-CM | POA: Diagnosis present

## 2017-10-18 DIAGNOSIS — N920 Excessive and frequent menstruation with regular cycle: Secondary | ICD-10-CM | POA: Insufficient documentation

## 2017-10-18 DIAGNOSIS — D573 Sickle-cell trait: Secondary | ICD-10-CM

## 2017-10-18 DIAGNOSIS — Z79899 Other long term (current) drug therapy: Secondary | ICD-10-CM | POA: Insufficient documentation

## 2017-10-18 LAB — CBC WITH DIFFERENTIAL (CANCER CENTER ONLY)
BASOS PCT: 0 %
Basophils Absolute: 0 10*3/uL (ref 0.0–0.1)
Eosinophils Absolute: 0.2 10*3/uL (ref 0.0–0.5)
Eosinophils Relative: 5 %
HEMATOCRIT: 39.1 % (ref 34.8–46.6)
HEMOGLOBIN: 13.4 g/dL (ref 11.6–15.9)
Lymphocytes Relative: 46 %
Lymphs Abs: 2.3 10*3/uL (ref 0.9–3.3)
MCH: 30.2 pg (ref 26.0–34.0)
MCHC: 34.3 g/dL (ref 32.0–36.0)
MCV: 88.3 fL (ref 81.0–101.0)
MONOS PCT: 6 %
Monocytes Absolute: 0.3 10*3/uL (ref 0.1–0.9)
NEUTROS ABS: 2.2 10*3/uL (ref 1.5–6.5)
NEUTROS PCT: 43 %
Platelet Count: 250 10*3/uL (ref 145–400)
RBC: 4.43 MIL/uL (ref 3.70–5.32)
RDW: 11.8 % (ref 11.1–15.7)
WBC Count: 5.1 10*3/uL (ref 3.9–10.0)

## 2017-10-18 LAB — RETICULOCYTES
RBC.: 4.35 MIL/uL (ref 3.70–5.45)
Retic Count, Absolute: 117.5 10*3/uL — ABNORMAL HIGH (ref 33.7–90.7)
Retic Ct Pct: 2.7 % — ABNORMAL HIGH (ref 0.7–2.1)

## 2017-10-18 NOTE — Progress Notes (Signed)
Hematology and Oncology Follow Up Visit  Arthurine Oleary [id number] April 26, 1990 27 y.o. 10/18/2017   Principle Diagnosis:  Iron deficiency anemia secondary to menorrhagia   Current Therapy:   IV iron as indicated - last received in June 2017   Interim History: [name] is here today with her beautiful baby girl Jayla. She is doing well and has no complaints at this time.  She denies fatigue. She stopped breast feeding and had a cycle last month for 3 days. The first day was heavy and the other 2 were light.  No fever, chills, n/v, cough, rash, dizziness, SOB, chest pain, palpitations, abdominal pain or changes in bowel or bladder habits.  No swelling, tenderness, numbness or tingling in her extremities. No c/o pain.  She has maintained a good appetite and is staying well hydrated. Her weight is stable.   ECOG Performance Status: 1 - Symptomatic but completely ambulatory  Medications:  Allergies as of 10/18/2017      Reactions   Nickel Hives      Medication List        Accurate as of 10/18/17  1:40 PM. Always use your most recent med list.          enalapril 5 MG tablet Commonly known as:  VASOTEC Take 1 tablet (5 mg total) by mouth daily.   montelukast 10 MG tablet Commonly known as:  SINGULAIR Take 10 mg by mouth at bedtime.   norethindrone 0.35 MG tablet Commonly known as:  ORTHO MICRONOR Take 1 tablet (0.35 mg total) by mouth daily.   PRENATAL GUMMIES/DHA & FA 0.4-32.5 MG Chew Chew 1 tablet by mouth daily.   triamcinolone ointment 0.1 % Commonly known as:  KENALOG Apply 1 application topically 2 (two) times daily.       Allergies:  Allergies  Allergen Reactions  . Nickel Hives    Past Medical History, Surgical history, Social history, and Family History were reviewed and updated.  Review of Systems: All other 10 point review of systems is negative.   Physical Exam:  oral temperature is 98.3 F (36.8 C). Her blood pressure is 131/71 and her  pulse is 84. Her respiration is 18 and oxygen saturation is 95%.   Wt Readings from Last 3 Encounters:  09/10/17 225 lb 3.2 oz (102.2 kg)  06/24/17 212 lb (96.2 kg)  06/18/17 215 lb (97.5 kg)    Ocular: Sclerae unicteric, pupils equal, round and reactive to light Ear-nose-throat: Oropharynx clear, dentition fair Lymphatic: No cervical, supraclavicular or axillary adenopathy Lungs no rales or rhonchi, good excursion bilaterally Heart regular rate and rhythm, no murmur appreciated Abd soft, nontender, positive bowel sounds, no liver or spleen tip palpated on exam, no fluid wave  MSK no focal spinal tenderness, no joint edema Neuro: non-focal, well-oriented, appropriate affect Breasts: Deferred   Lab Results  Component Value Date   WBC 5.1 10/18/2017   HGB 13.4 10/18/2017   HCT 39.1 10/18/2017   MCV 88.3 10/18/2017   PLT 250 10/18/2017   Lab Results  Component Value Date   FERRITIN 37 04/16/2017   IRON 98 04/16/2017   TIBC 398 04/16/2017   UIBC 300 04/16/2017   IRONPCTSAT 25 04/16/2017   Lab Results  Component Value Date   RETICCTPCT 3.1 (H) 04/16/2017   RBC 4.43 10/18/2017   No results found for: KPAFRELGTCHN, LAMBDASER, KAPLAMBRATIO No results found for: IGGSERUM, IGA, IGMSERUM No results found for: TOTALPROTELP, ALBUMINELP, A1GS, A2GS, BETS, BETA2SER, GAMS, MSPIKE, SPEI   Chemistry  Component Value Date/Time   NA 142 06/18/2017 1140   K 4.8 06/18/2017 1140   CL 104 06/18/2017 1140   CO2 17 (L) 06/18/2017 1140   BUN 22 (H) 06/18/2017 1140   CREATININE 1.19 (H) 07/19/2017 1025      Component Value Date/Time   CALCIUM 9.4 06/18/2017 1140   ALKPHOS 75 07/03/2015 1647   AST 17 07/03/2015 1647   ALT 16 07/03/2015 1647   BILITOT 0.3 07/03/2015 1647      Impression and Plan: Ms. Pricilla LarssonMalachi is a very pleasant 27 yo African American female with ion deficiency anemia secondary to heavy cycles.  We will see what her iron studies show and bring her back in for  infusion if needed.  We will plan to see her back in another 6 months for follow-up.  She will contact our office with any questions or concerns. We can certainly see her sooner if need be.   Emeline GinsSarah Meshelle Holness, NP 7/15/20191:40 PM

## 2017-10-19 LAB — IRON AND TIBC
Iron: 54 ug/dL (ref 41–142)
Saturation Ratios: 15 % — ABNORMAL LOW (ref 21–57)
TIBC: 354 ug/dL (ref 236–444)
UIBC: 299 ug/dL

## 2017-10-19 LAB — FERRITIN: Ferritin: 86 ng/mL (ref 11–307)

## 2017-10-28 ENCOUNTER — Inpatient Hospital Stay: Payer: Medicaid Other

## 2017-10-28 ENCOUNTER — Other Ambulatory Visit: Payer: Self-pay

## 2017-10-28 VITALS — BP 128/81 | HR 93 | Temp 98.0°F | Resp 20

## 2017-10-28 DIAGNOSIS — N921 Excessive and frequent menstruation with irregular cycle: Secondary | ICD-10-CM

## 2017-10-28 DIAGNOSIS — D500 Iron deficiency anemia secondary to blood loss (chronic): Secondary | ICD-10-CM | POA: Diagnosis not present

## 2017-10-28 MED ORDER — FERUMOXYTOL INJECTION 510 MG/17 ML
510.0000 mg | Freq: Once | INTRAVENOUS | Status: AC
  Administered 2017-10-28: 510 mg via INTRAVENOUS
  Filled 2017-10-28: qty 17

## 2017-10-28 MED ORDER — SODIUM CHLORIDE 0.9 % IV SOLN
Freq: Once | INTRAVENOUS | Status: AC
  Administered 2017-10-28: 14:00:00 via INTRAVENOUS
  Filled 2017-10-28: qty 250

## 2017-10-28 NOTE — Patient Instructions (Signed)

## 2017-10-31 ENCOUNTER — Encounter: Payer: Self-pay | Admitting: Family Medicine

## 2017-11-18 ENCOUNTER — Ambulatory Visit: Payer: Medicaid Other | Admitting: Family Medicine

## 2017-11-18 ENCOUNTER — Encounter: Payer: Self-pay | Admitting: Family Medicine

## 2017-11-18 VITALS — BP 140/90 | HR 97 | Ht 60.0 in

## 2017-11-18 DIAGNOSIS — M5441 Lumbago with sciatica, right side: Secondary | ICD-10-CM

## 2017-11-18 DIAGNOSIS — M9903 Segmental and somatic dysfunction of lumbar region: Secondary | ICD-10-CM

## 2017-11-18 DIAGNOSIS — M5442 Lumbago with sciatica, left side: Secondary | ICD-10-CM | POA: Diagnosis not present

## 2017-11-18 DIAGNOSIS — Z3202 Encounter for pregnancy test, result negative: Secondary | ICD-10-CM | POA: Diagnosis not present

## 2017-11-18 DIAGNOSIS — N912 Amenorrhea, unspecified: Secondary | ICD-10-CM | POA: Diagnosis not present

## 2017-11-18 LAB — POCT URINE PREGNANCY: PREG TEST UR: NEGATIVE

## 2017-11-18 MED ORDER — BACLOFEN 10 MG PO TABS: 10.0000 mg | ORAL_TABLET | Freq: Three times a day (TID) | ORAL | 0 refills | Status: DC | PRN

## 2017-11-18 NOTE — Progress Notes (Signed)
Patient hasnt had a period in 45 days. Patient states LMP in June. Patient states she has missed a few pills. POCT pregnancy test negative. Armandina StammerJennifer Eloina Ergle RN

## 2017-11-18 NOTE — Progress Notes (Signed)
   Subjective:    Patient ID: Cheryl Mullen, female    DOB: 10/15/1990, 27 y.o.   MRN: [id number]  HPI Patient seen for bilateral lower back pain, radiation into buttocks bilaterally, but right greater than left. Worse when standing for short periods when holding daughter. No palliating factors. Worsening.   Review of Systems     Objective:   Physical Exam  Constitutional: She is oriented to person, place, and time. She appears well-developed and well-nourished.  Abdominal: Soft. She exhibits no distension. There is no tenderness.  Musculoskeletal: Normal range of motion.  Tenderness to lower lumbar paraspinals.  OSE: right ant innom, L/L sacral torsion. L5 ESRL, L4 ESRR  Neurological: She is alert and oriented to person, place, and time. She displays normal reflexes. No cranial nerve deficit.  Skin: Skin is warm and dry.  Psychiatric: She has a normal mood and affect. Her behavior is normal. Thought content normal.      Assessment & Plan:  1. Amenorrhea - POCT urine pregnancy  2. Acute bilateral low back pain with bilateral sciatica 3. Somatic dysfunction of spine, lumbar OMT done after patient permission. HVLA technique utilized. 3 areas treated with improvement of tissue texture and joint mobility. Patient tolerated procedure well.

## 2017-12-13 ENCOUNTER — Ambulatory Visit: Payer: Medicaid Other | Admitting: Family Medicine

## 2018-04-06 ENCOUNTER — Encounter (HOSPITAL_BASED_OUTPATIENT_CLINIC_OR_DEPARTMENT_OTHER): Payer: Self-pay | Admitting: Emergency Medicine

## 2018-04-06 ENCOUNTER — Other Ambulatory Visit: Payer: Self-pay

## 2018-04-06 ENCOUNTER — Emergency Department (HOSPITAL_BASED_OUTPATIENT_CLINIC_OR_DEPARTMENT_OTHER)
Admission: EM | Admit: 2018-04-06 | Discharge: 2018-04-06 | Disposition: A | Discharge status: Home / Self Care | Payer: Medicaid Other | Attending: Emergency Medicine | Admitting: Emergency Medicine

## 2018-04-06 DIAGNOSIS — J111 Influenza due to unidentified influenza virus with other respiratory manifestations: Secondary | ICD-10-CM | POA: Insufficient documentation

## 2018-04-06 DIAGNOSIS — R69 Illness, unspecified: Secondary | ICD-10-CM

## 2018-04-06 DIAGNOSIS — R05 Cough: Secondary | ICD-10-CM | POA: Diagnosis present

## 2018-04-06 LAB — INFLUENZA PANEL BY PCR (TYPE A & B)
INFLBPCR: NEGATIVE
Influenza A By PCR: POSITIVE — AB

## 2018-04-06 MED ORDER — BALOXAVIR MARBOXIL(80 MG DOSE) 2 X 40 MG PO TBPK: 80.0000 mg | ORAL_TABLET | Freq: Once | ORAL | 0 refills | Status: AC

## 2018-04-06 MED ORDER — ACETAMINOPHEN ER 650 MG PO TBCR: 650.0000 mg | EXTENDED_RELEASE_TABLET | Freq: Three times a day (TID) | ORAL | 0 refills | Status: AC | PRN

## 2018-04-06 MED ORDER — IBUPROFEN 800 MG PO TABS
800.0000 mg | ORAL_TABLET | Freq: Once | ORAL | Status: AC
  Administered 2018-04-06: 800 mg via ORAL
  Filled 2018-04-06: qty 1

## 2018-04-06 MED ORDER — IBUPROFEN 800 MG PO TABS: 800.0000 mg | ORAL_TABLET | Freq: Three times a day (TID) | ORAL | 0 refills | Status: DC

## 2018-04-06 NOTE — ED Notes (Signed)
Family at bedside. 

## 2018-04-06 NOTE — ED Provider Notes (Signed)
[facility] EMERGENCY DEPARTMENT Provider Note   CSN: [id number] Arrival date & time: 04/06/18  0940     History   Chief Complaint Chief Complaint  Patient presents with  . URI    HPI Cheryl Mullen is a 28 y.o. female.  28 year old female presents with complaint of cough, congestion, sore throat, body aches, fevers, chills, nausea and vomiting (no recent emesis).  Patient's symptoms started last night, has not taken anything for her symptoms.  Patient has a history of asthma as a child, has not any complications since that time, non smoker, no there complaints or concerns.      Past Medical History:  Diagnosis Date  . Allergy   . Anemia   . Eczema   . Gestational diabetes     Patient Active Problem List   Diagnosis Date Noted  . 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 Postpartum hypertension 07/15/2017  . Gestational diabetes 04/16/2017  . Sickle cell trait (HCC) 02/19/2017  . 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 Trichomoniasis 01/15/2017  . LGSIL of cervix of undetermined significance 01/15/2017  . IDA (iron deficiency anemia) 07/19/2015  . Menorrhagia with irregular cycle 07/19/2015  . Panic 05/31/2015  . Obesity (BMI 30-39.9) 05/17/2013  . Eczema 05/17/2013  . Discoloration of skin 01/17/2013    Past Surgical History:  Procedure Laterality Date  . CESAREAN SECTION N/A 06/07/2017   Procedure: CESAREAN SECTION;  Surgeon: Tereso Newcomer, MD;  Location: [facility];  Service: Obstetrics;  Laterality: N/A;  . TONSILLECTOMY AND ADENOIDECTOMY Bilateral 01/02/2013  . WISDOM TOOTH EXTRACTION       OB History    Gravida  1   Para  1   Term  1   Preterm      AB      Living  1     SAB      TAB      Ectopic      Multiple  0   Live Births  1            Home Medications    Prior to Admission medications   Medication Sig Start Date End Date Taking? Authorizing Provider  acetaminophen (TYLENOL 8 HOUR) 650 MG CR tablet Take 1  tablet (650 mg total) by mouth every 8 (eight) hours as needed for up to 7 days for pain or fever. 04/06/18 04/13/18  Jeannie Fend, PA-C  baclofen (LIORESAL) 10 MG tablet Take 1 tablet (10 mg total) by mouth 3 (three) times daily as needed for muscle spasms. 11/18/17   Levie Heritage, DO  Baloxavir Marboxil,80 MG Dose, (XOFLUZA) 2 x 40 MG TBPK Take 80 mg by mouth once for 1 dose. 04/06/18 04/06/18  Army Melia A, PA-C  enalapril (VASOTEC) 5 MG tablet Take 1 tablet (5 mg total) by mouth daily. Patient not taking: Reported on 09/10/2017 07/26/17   Levie Heritage, DO  ibuprofen (ADVIL,MOTRIN) 800 MG tablet Take 1 tablet (800 mg total) by mouth 3 (three) times daily. 04/06/18   Jeannie Fend, PA-C  montelukast (SINGULAIR) 10 MG tablet Take 10 mg by mouth at bedtime.    [provider]  norethindrone (ORTHO MICRONOR) 0.35 MG tablet Take 1 tablet (0.35 mg total) by mouth daily. 07/15/17   Levie Heritage, DO  Prenatal MV-Min-FA-Omega-3 (PRENATAL GUMMIES/DHA & FA) 0.4-32.5 MG CHEW Chew 1 tablet by mouth daily. 12/18/16   Levie Heritage, DO  triamcinolone ointment (KENALOG) 0.1 % Apply 1 application topically 2 (two)  times daily. 09/10/17   Donato SchultzLowne Chase, Yvonne R, DO    Family History Family History  Problem Relation Age of Onset  . Hypertension Mother   . Hyperlipidemia Mother   . Hypertension Father   . Hyperlipidemia Father   . Hypertension Maternal Grandmother   . Hypertension Maternal Grandfather   . Diabetes Maternal Grandfather   . Hypertension Paternal Grandmother   . Hypertension Paternal Grandfather   . Cancer Neg Hx     Social History Social History   Tobacco Use  . Smoking status: Never Smoker  . Smokeless tobacco: Never Used  Substance Use Topics  . Alcohol use: Yes    Alcohol/week: 0.0 standard drinks  . Drug use: No     Allergies   Nickel   Review of Systems Review of Systems  Constitutional: Positive for chills and fever.  HENT: Positive for congestion.  Negative for ear pain, postnasal drip, sinus pressure and sinus pain.   Eyes: Negative for discharge and redness.  Respiratory: Positive for cough. Negative for shortness of breath and wheezing.   Cardiovascular: Negative for chest pain.  Gastrointestinal: Positive for nausea and vomiting. Negative for constipation and diarrhea.  Musculoskeletal: Positive for arthralgias and myalgias.  Skin: Negative for rash.  Allergic/Immunologic: Positive for immunocompromised state.  Neurological: Negative for weakness.  Hematological: Negative for adenopathy.  Psychiatric/Behavioral: Negative for confusion.  All other systems reviewed and are negative.    Physical Exam Updated Vital Signs BP (!) 133/93   Pulse (!) 113   Temp (!) 102.4 F (39.1 C) (Oral)   Resp (!) 22   Ht 5' (1.524 m)   Wt 105.2 kg   LMP 04/06/2018   SpO2 99%   BMI 45.31 kg/m   Physical Exam Vitals signs and nursing note reviewed.  Constitutional:      Appearance: Normal appearance. She is obese.  HENT:     Head: Normocephalic and atraumatic.     Right Ear: Tympanic membrane and ear canal normal.     Left Ear: Tympanic membrane and ear canal normal.     Nose: Congestion present. No rhinorrhea.     Mouth/Throat:     Mouth: Mucous membranes are moist.     Pharynx: Posterior oropharyngeal erythema present. No oropharyngeal exudate.  Eyes:     Conjunctiva/sclera: Conjunctivae normal.  Neck:     Musculoskeletal: Neck supple. No muscular tenderness.  Cardiovascular:     Rate and Rhythm: Regular rhythm. Tachycardia present.     Pulses: Normal pulses.     Heart sounds: Normal heart sounds. No murmur.  Pulmonary:     Effort: Pulmonary effort is normal.     Breath sounds: Normal breath sounds. No wheezing.  Lymphadenopathy:     Cervical: No cervical adenopathy.  Skin:    General: Skin is warm and dry.  Neurological:     Mental Status: She is alert and oriented to person, place, and time.  Psychiatric:         Behavior: Behavior normal.      ED Treatments / Results  Labs (all labs ordered are listed, but only abnormal results are displayed) Labs Reviewed  INFLUENZA PANEL BY PCR (TYPE A & B) - Abnormal; Notable for the following components:      Result Value   Influenza A By PCR POSITIVE (*)    All other components within normal limits    EKG None  Radiology No results found.  Procedures Procedures (including critical care time)  Medications Ordered in  ED Medications  ibuprofen (ADVIL,MOTRIN) tablet 800 mg (800 mg Oral Given 04/06/18 1003)     Initial Impression / Assessment and Plan / ED Course  I have reviewed the triage vital signs and the nursing notes.  Pertinent labs & imaging results that were available during my care of the patient were reviewed by me and considered in my medical decision making (see chart for details).  Clinical Course as of Apr 06 1606  Wed Apr 06, 2018  1159 Influenza panel by PCR (type A & B) [LM]  [id number] 28 year old female presents with flulike symptoms onset last night.  On exam patient is well-appearing, boggy nasal membranes, lung sounds are clear.  Patient was febrile and tachycardic in triage, given Tylenol.  Patient has a history of DiGeorge syndrome, recommend treatment with antiviral, given prescription for Xofluza and advised to take this today as soon as possible.  Patient has other family members with similar immune compromised state that she was exposed to today, flu testing sent out and returned after patient's discharge positive for influenza A.  Patient was advised prior to discharge to call the hospital later today for her test results to notify exposed family members so they may notify their providers for any treatment recommendations.  Patient to follow-up with her PCP, return to the ER for any new or worsening symptoms.   [LM]    Clinical Course User Index [LM] Jeannie FendMurphy, Roxana Lai A, PA-C   Final Clinical Impressions(s) / ED Diagnoses    Final diagnoses:  Influenza-like illness    ED Discharge Orders         Ordered    ibuprofen (ADVIL,MOTRIN) 800 MG tablet  3 times daily     04/06/18 1016    Baloxavir Marboxil,80 MG Dose, (XOFLUZA) 2 x 40 MG TBPK   Once     04/06/18 1016    acetaminophen (TYLENOL 8 HOUR) 650 MG CR tablet  Every 8 hours PRN     04/06/18 1016           Alden HippMurphy, Kariss Longmire A, PA-C 04/06/18 1608    Alvira MondaySchlossman, Erin, MD 04/09/18 1031

## 2018-04-06 NOTE — ED Notes (Signed)
ED Provider at bedside. 

## 2018-04-06 NOTE — ED Triage Notes (Signed)
Headache, fever, cough, congestion, body aches since last night. Some N/V.

## 2018-04-06 NOTE — Discharge Instructions (Addendum)
Take Motrin and Tylenol as needed as prescribed. Do NOT take other medications containing Tylenol in addition to the Tylenol prescribed today. Take Lasandra BeechXofluza today as soon as possible. This medicine only helps if taken in the first 48 hours of symptoms. Home to rest. Drink plenty of hydrating fluids such as G2.  ER for severe or concerning symptoms. Return to work when fever free for 24 hours without taking fever reducing medications.

## 2018-04-18 ENCOUNTER — Inpatient Hospital Stay: Payer: Medicaid Other | Attending: Family | Admitting: Family

## 2018-04-18 ENCOUNTER — Encounter: Payer: Self-pay | Admitting: Family

## 2018-04-18 ENCOUNTER — Inpatient Hospital Stay: Payer: Medicaid Other

## 2018-04-18 ENCOUNTER — Other Ambulatory Visit: Payer: Self-pay

## 2018-04-18 VITALS — BP 120/72 | HR 86 | Temp 98.7°F | Resp 18 | Wt 229.0 lb

## 2018-04-18 DIAGNOSIS — N921 Excessive and frequent menstruation with irregular cycle: Secondary | ICD-10-CM

## 2018-04-18 DIAGNOSIS — D500 Iron deficiency anemia secondary to blood loss (chronic): Secondary | ICD-10-CM | POA: Diagnosis present

## 2018-04-18 DIAGNOSIS — Z79899 Other long term (current) drug therapy: Secondary | ICD-10-CM | POA: Insufficient documentation

## 2018-04-18 DIAGNOSIS — D573 Sickle-cell trait: Secondary | ICD-10-CM

## 2018-04-18 DIAGNOSIS — N920 Excessive and frequent menstruation with regular cycle: Secondary | ICD-10-CM | POA: Diagnosis not present

## 2018-04-18 LAB — CBC WITH DIFFERENTIAL (CANCER CENTER ONLY)
Abs Immature Granulocytes: 0.01 10*3/uL (ref 0.00–0.07)
Basophils Absolute: 0 10*3/uL (ref 0.0–0.1)
Basophils Relative: 0 %
Eosinophils Absolute: 0.1 10*3/uL (ref 0.0–0.5)
Eosinophils Relative: 2 %
HCT: 38.9 % (ref 36.0–46.0)
Hemoglobin: 13.2 g/dL (ref 12.0–15.0)
Immature Granulocytes: 0 %
Lymphocytes Relative: 48 %
Lymphs Abs: 2.5 10*3/uL (ref 0.7–4.0)
MCH: 29.5 pg (ref 26.0–34.0)
MCHC: 33.9 g/dL (ref 30.0–36.0)
MCV: 87 fL (ref 80.0–100.0)
Monocytes Absolute: 0.4 10*3/uL (ref 0.1–1.0)
Monocytes Relative: 8 %
Neutro Abs: 2.2 10*3/uL (ref 1.7–7.7)
Neutrophils Relative %: 42 %
PLATELETS: 282 10*3/uL (ref 150–400)
RBC: 4.47 MIL/uL (ref 3.87–5.11)
RDW: 12.6 % (ref 11.5–15.5)
WBC Count: 5.3 10*3/uL (ref 4.0–10.5)
nRBC: 0 % (ref 0.0–0.2)

## 2018-04-18 LAB — RETICULOCYTES
Immature Retic Fract: 12.8 % (ref 2.3–15.9)
RBC.: 4.47 MIL/uL (ref 3.87–5.11)
Retic Count, Absolute: 116.2 10*3/uL (ref 19.0–186.0)
Retic Ct Pct: 2.6 % (ref 0.4–3.1)

## 2018-04-18 NOTE — Progress Notes (Signed)
Hematology and Oncology Follow Up Visit  Cheryl Mullen [id number] 1990/10/25 28 y.o. 04/18/2018   Principle Diagnosis:  Iron deficiency anemia secondary to menorrhagia  Current Therapy:  IV iron as indicated    Interim History:  Cheryl Mullen is here today for follow-up with her sweet baby girl. She is doing well and has no complaints at this time.  Her cycle is irregular at times but not heavy. She has had no other bleeding, no bruising or petechiae.  No fever, chills, n/v, cough rash, dizziness, SOB, chest pain, palpitations, abdominal pain or changes in bowel or bladder habits.  No swelling, tenderness, numbness or tingling in her extremities.  No lymphadenopathy noted on exam.  She has maintained a good appetite and is staying well hydrated. Her weight is stable.   ECOG Performance Status: 1 - Symptomatic but completely ambulatory  Medications:  Allergies as of 04/18/2018      Reactions   Nickel Hives      Medication List       Accurate as of April 18, 2018 10:48 AM. Always use your most recent med list.        baclofen 10 MG tablet Commonly known as:  LIORESAL Take 1 tablet (10 mg total) by mouth 3 (three) times daily as needed for muscle spasms.   enalapril 5 MG tablet Commonly known as:  VASOTEC Take 1 tablet (5 mg total) by mouth daily.   ibuprofen 800 MG tablet Commonly known as:  ADVIL,MOTRIN Take 1 tablet (800 mg total) by mouth 3 (three) times daily.   montelukast 10 MG tablet Commonly known as:  SINGULAIR Take 10 mg by mouth at bedtime.   norethindrone 0.35 MG tablet Commonly known as:  ORTHO MICRONOR Take 1 tablet (0.35 mg total) by mouth daily.   PRENATAL GUMMIES/DHA & FA 0.4-32.5 MG Chew Chew 1 tablet by mouth daily.   triamcinolone ointment 0.1 % Commonly known as:  KENALOG Apply 1 application topically 2 (two) times daily.       Allergies:  Allergies  Allergen Reactions  . Nickel Hives    Past Medical History, Surgical  history, Social history, and Family History were reviewed and updated.  Review of Systems: All other 10 point review of systems is negative.   Physical Exam:  vitals were not taken for this visit.   Wt Readings from Last 3 Encounters:  04/06/18 232 lb (105.2 kg)  09/10/17 225 lb 3.2 oz (102.2 kg)  06/24/17 212 lb (96.2 kg)    Ocular: Sclerae unicteric, pupils equal, round and reactive to light Ear-nose-throat: Oropharynx clear, dentition fair Lymphatic: No cervical, supraclavicular or axillary adenopathy Lungs no rales or rhonchi, good excursion bilaterally Heart regular rate and rhythm, no murmur appreciated Abd soft, nontender, positive bowel sounds, no liver or spleen tip palpated on exam, no fluid wave  MSK no focal spinal tenderness, no joint edema Neuro: non-focal, well-oriented, appropriate affect Breasts: Deferred   Lab Results  Component Value Date   WBC 5.1 10/18/2017   HGB 13.4 10/18/2017   HCT 39.1 10/18/2017   MCV 88.3 10/18/2017   PLT 250 10/18/2017   Lab Results  Component Value Date   FERRITIN 86 10/18/2017   IRON 54 10/18/2017   TIBC 354 10/18/2017   UIBC 299 10/18/2017   IRONPCTSAT 15 (L) 10/18/2017   Lab Results  Component Value Date   RETICCTPCT 2.7 (H) 10/18/2017   RBC 4.43 10/18/2017   RBC 4.35 10/18/2017   No results found for: KPAFRELGTCHN, LAMBDASER,  KAPLAMBRATIO No results found for: IGGSERUM, IGA, IGMSERUM No results found for: Dorene ArOTALPROTELP, ALBUMINELP, A1GS, A2GS, Colin BentonBETS, BETA2SER, GAMS, MSPIKE, SPEI   Chemistry      Component Value Date/Time   NA 142 06/18/2017 1140   K 4.8 06/18/2017 1140   CL 104 06/18/2017 1140   CO2 17 (L) 06/18/2017 1140   BUN 22 (H) 06/18/2017 1140   CREATININE 1.19 (H) 07/19/2017 1025      Component Value Date/Time   CALCIUM 9.4 06/18/2017 1140   ALKPHOS 75 07/03/2015 1647   AST 17 07/03/2015 1647   ALT 16 07/03/2015 1647   BILITOT 0.3 07/03/2015 1647       Impression and Plan: Cheryl Mullen is a  very pleasant 28 yo African American female with iron deficiency anemia secondary to heavy cycles.  We will see what her iron studies show and bring her back in for infusion if needed.  We will plan to see her in another 6 months.  She will contact our office with any questions or concerns. We can certainly see her sooner if need be.   Emeline GinsSarah Denicia Pagliarulo, NP 1/13/202010:48 AM

## 2018-04-19 LAB — IRON AND TIBC
Iron: 84 ug/dL (ref 41–142)
Saturation Ratios: 30 % (ref 21–57)
TIBC: 279 ug/dL (ref 236–444)
UIBC: 195 ug/dL (ref 120–384)

## 2018-04-19 LAB — FERRITIN: Ferritin: 130 ng/mL (ref 11–307)

## 2018-04-21 ENCOUNTER — Encounter: Payer: Self-pay | Admitting: Family

## 2018-05-13 ENCOUNTER — Encounter: Payer: Self-pay | Admitting: Family

## 2018-05-13 ENCOUNTER — Encounter: Payer: Self-pay | Admitting: Family Medicine

## 2018-07-29 ENCOUNTER — Encounter: Payer: Self-pay | Admitting: Family Medicine

## 2018-08-09 ENCOUNTER — Telehealth: Payer: Self-pay

## 2018-08-09 ENCOUNTER — Ambulatory Visit: Payer: Medicaid Other

## 2018-08-09 ENCOUNTER — Other Ambulatory Visit: Payer: Self-pay

## 2018-08-09 DIAGNOSIS — N912 Amenorrhea, unspecified: Secondary | ICD-10-CM

## 2018-08-09 NOTE — Telephone Encounter (Signed)
Patient called and is 20 days late on her period. Patient has taken home pregnancy tests that are negative. Patient is becoming more nauseated in last few days.   Will have patient come in for HCG . [name] RN

## 2018-08-09 NOTE — Telephone Encounter (Signed)
That's fine. Put the order under me and I'll look for it.

## 2018-08-09 NOTE — Progress Notes (Signed)
Patient presents for lab draw. Patient is 20 days late for her period.  [name] RN

## 2018-08-10 LAB — BETA HCG QUANT (REF LAB): hCG Quant: <1 m[IU]/mL

## 2018-09-29 ENCOUNTER — Ambulatory Visit (INDEPENDENT_AMBULATORY_CARE_PROVIDER_SITE_OTHER): Payer: Medicaid Other | Admitting: Family Medicine

## 2018-09-29 ENCOUNTER — Encounter: Payer: Self-pay | Admitting: Family Medicine

## 2018-09-29 ENCOUNTER — Other Ambulatory Visit: Payer: Self-pay

## 2018-09-29 VITALS — BP 128/81 | HR 98 | Ht 60.0 in | Wt 240.0 lb

## 2018-09-29 DIAGNOSIS — L309 Dermatitis, unspecified: Secondary | ICD-10-CM | POA: Diagnosis not present

## 2018-09-29 DIAGNOSIS — N939 Abnormal uterine and vaginal bleeding, unspecified: Secondary | ICD-10-CM

## 2018-09-29 MED ORDER — TRIAMCINOLONE ACETONIDE 0.1 % EX OINT: 1.0000 "application " | TOPICAL_OINTMENT | Freq: Two times a day (BID) | CUTANEOUS | 3 refills | Status: DC

## 2018-09-29 MED ORDER — NORGESTIMATE-ETH ESTRADIOL 0.25-35 MG-MCG PO TABS: 1.0000 | ORAL_TABLET | Freq: Every day | ORAL | 11 refills | Status: DC

## 2018-09-29 NOTE — Progress Notes (Signed)
   Subjective:    Patient ID: Cheryl Mullen, female    DOB: [birth year], 28 y.o.   MRN: [id number]  HPI Patient seen for irregular bleeding.  She was having normal periods, then went 60 to 70 days without a period.  The past 10 days, she has been having some intermittent heavy bleeding with clots, then no bleeding. No recent weight loss or gain, no new medications, no galactorrhea.   Review of Systems     Objective:   Physical Exam Constitutional:      Appearance: Normal appearance.  Pulmonary:     Effort: Pulmonary effort is normal.  Abdominal:     General: Abdomen is flat. There is no distension.     Tenderness: There is no abdominal tenderness. There is no guarding.  Neurological:     General: No focal deficit present.     Mental Status: She is alert.  Psychiatric:        Mood and Affect: Mood normal.        Behavior: Behavior normal.        Thought Content: Thought content normal.        Judgment: Judgment normal.        Assessment & Plan:  1. Abnormal vaginal bleeding Sprintec for 3 months, then will reevaluate  2. Eczema of both hands - triamcinolone ointment (KENALOG) 0.1 %; Apply 1 application topically 2 (two) times daily.  Dispense: 80 g; Refill: 3

## 2018-09-29 NOTE — Progress Notes (Signed)
Patient didn't have a period for 63 days. Patient started her period on June 16th and has had a period that is on and off for ten days. Mulitple negative home pregnancy test. Kathrene Alu RN

## 2018-10-17 ENCOUNTER — Other Ambulatory Visit: Payer: Self-pay

## 2018-10-17 ENCOUNTER — Inpatient Hospital Stay: Payer: Medicaid Other | Attending: Hematology & Oncology

## 2018-10-17 ENCOUNTER — Encounter: Payer: Self-pay | Admitting: Family

## 2018-10-17 ENCOUNTER — Inpatient Hospital Stay (HOSPITAL_BASED_OUTPATIENT_CLINIC_OR_DEPARTMENT_OTHER): Payer: Medicaid Other | Admitting: Family

## 2018-10-17 VITALS — BP 138/88 | HR 88 | Temp 97.9°F | Resp 19 | Ht 60.0 in | Wt 237.0 lb

## 2018-10-17 DIAGNOSIS — Z793 Long term (current) use of hormonal contraceptives: Secondary | ICD-10-CM | POA: Diagnosis not present

## 2018-10-17 DIAGNOSIS — N921 Excessive and frequent menstruation with irregular cycle: Secondary | ICD-10-CM

## 2018-10-17 DIAGNOSIS — Z79899 Other long term (current) drug therapy: Secondary | ICD-10-CM | POA: Insufficient documentation

## 2018-10-17 DIAGNOSIS — N920 Excessive and frequent menstruation with regular cycle: Secondary | ICD-10-CM | POA: Diagnosis not present

## 2018-10-17 DIAGNOSIS — D500 Iron deficiency anemia secondary to blood loss (chronic): Secondary | ICD-10-CM | POA: Insufficient documentation

## 2018-10-17 DIAGNOSIS — D573 Sickle-cell trait: Secondary | ICD-10-CM

## 2018-10-17 LAB — CBC WITH DIFFERENTIAL (CANCER CENTER ONLY)
Abs Immature Granulocytes: 0.03 10*3/uL (ref 0.00–0.07)
Basophils Absolute: 0 10*3/uL (ref 0.0–0.1)
Basophils Relative: 0 %
Eosinophils Absolute: 0.1 10*3/uL (ref 0.0–0.5)
Eosinophils Relative: 1 %
HCT: 36.9 % (ref 36.0–46.0)
Hemoglobin: 12.3 g/dL (ref 12.0–15.0)
Immature Granulocytes: 1 %
Lymphocytes Relative: 37 %
Lymphs Abs: 2.1 10*3/uL (ref 0.7–4.0)
MCH: 28.9 pg (ref 26.0–34.0)
MCHC: 33.3 g/dL (ref 30.0–36.0)
MCV: 86.8 fL (ref 80.0–100.0)
Monocytes Absolute: 0.2 10*3/uL (ref 0.1–1.0)
Monocytes Relative: 3 %
Neutro Abs: 3.3 10*3/uL (ref 1.7–7.7)
Neutrophils Relative %: 58 %
Platelet Count: 286 10*3/uL (ref 150–400)
RBC: 4.25 MIL/uL (ref 3.87–5.11)
RDW: 13.3 % (ref 11.5–15.5)
WBC Count: 5.7 10*3/uL (ref 4.0–10.5)
nRBC: 0 % (ref 0.0–0.2)

## 2018-10-17 LAB — RETICULOCYTES
Immature Retic Fract: 21.5 % — ABNORMAL HIGH (ref 2.3–15.9)
RBC.: 4.25 MIL/uL (ref 3.87–5.11)
Retic Count, Absolute: 133 10*3/uL (ref 19.0–186.0)
Retic Ct Pct: 3.1 % (ref 0.4–3.1)

## 2018-10-17 NOTE — Progress Notes (Signed)
Hematology and Oncology Follow Up Visit  Cheryl Mullen [id number] [birth year] 28 y.o. 10/17/2018   Principle Diagnosis:  Iron deficiency anemia secondary to menorrhagia  Current Therapy:   IV iron as indicated    Interim History:  Cheryl Mullen is here today for follow-up. She is doing well but has noted some fatigue at times.  She states that her cycle has been irregular and quite heavy.  She has had no other bleeding, no bruising or petechiae.  No ice cravings, fever, chills, n/v, cough, rash, dizziness, SOB, chest pain, palpitations, abdominal pain or changes in bowel or bladder habits.  No swelling, tenderness, numbness or tingling in her extremities.  She has a good appetite and is staying hydrated. Her weight is stable.   ECOG Performance Status: 1 - Symptomatic but completely ambulatory  Medications:  Allergies as of 10/17/2018      Reactions   Nickel Hives      Medication List       Accurate as of October 17, 2018 11:29 AM. If you have any questions, ask your nurse or doctor.        montelukast 10 MG tablet Commonly known as: SINGULAIR Take 10 mg by mouth at bedtime.   norgestimate-ethinyl estradiol 0.25-35 MG-MCG tablet Commonly known as: ORTHO-CYCLEN Take 1 tablet by mouth daily.   triamcinolone ointment 0.1 % Commonly known as: KENALOG Apply 1 application topically 2 (two) times daily.       Allergies:  Allergies  Allergen Reactions  . Nickel Hives    Past Medical History, Surgical history, Social history, and Family History were reviewed and updated.  Review of Systems: All other 10 point review of systems is negative.   Physical Exam:  height is 5' (1.524 m) and weight is 237 lb 0.6 oz (107.5 kg). Her oral temperature is 97.9 F (36.6 C). Her blood pressure is 138/88 and her pulse is 88. Her respiration is 19 and oxygen saturation is 100%.   Wt Readings from Last 3 Encounters:  10/17/18 237 lb 0.6 oz (107.5 kg)  09/29/18 240 lb (108.9 kg)   04/18/18 229 lb (103.9 kg)    Ocular: Sclerae unicteric, pupils equal, round and reactive to light Ear-nose-throat: Oropharynx clear, dentition fair Lymphatic: No cervical or supraclavicular adenopathy Lungs no rales or rhonchi, good excursion bilaterally Heart regular rate and rhythm, no murmur appreciated Abd soft, nontender, positive bowel sounds, no liver or spleen tip palpated on exam, no fluid wave  MSK no focal spinal tenderness, no joint edema Neuro: non-focal, well-oriented, appropriate affect Breasts: Deferred    Lab Results  Component Value Date   WBC 5.7 10/17/2018   HGB 12.3 10/17/2018   HCT 36.9 10/17/2018   MCV 86.8 10/17/2018   PLT 286 10/17/2018   Lab Results  Component Value Date   FERRITIN 130 04/18/2018   IRON 84 04/18/2018   TIBC 279 04/18/2018   UIBC 195 04/18/2018   IRONPCTSAT 30 04/18/2018   Lab Results  Component Value Date   RETICCTPCT 3.1 10/17/2018   RBC 4.25 10/17/2018   RBC 4.25 10/17/2018   No results found for: KPAFRELGTCHN, LAMBDASER, KAPLAMBRATIO No results found for: IGGSERUM, IGA, IGMSERUM No results found for: TOTALPROTELP, ALBUMINELP, A1GS, A2GS, BETS, BETA2SER, GAMS, MSPIKE, SPEI   Chemistry      Component Value Date/Time   NA 142 06/18/2017 1140   K 4.8 06/18/2017 1140   CL 104 06/18/2017 1140   CO2 17 (L) 06/18/2017 1140   BUN 22 (H) 06/18/2017 1140  CREATININE 1.19 (H) 07/19/2017 1025      Component Value Date/Time   CALCIUM 9.4 06/18/2017 1140   ALKPHOS 75 07/03/2015 1647   AST 17 07/03/2015 1647   ALT 16 07/03/2015 1647   BILITOT 0.3 07/03/2015 1647       Impression and Plan: Cheryl Mullen is a very pleasant 28 yo African American female with iron deficiency anemia secondary to heavy cycles.  We will see what her iron studies show and bring her back in for infusion if needed.  We will plan to see her back in another 6 months.  She will contact our office with any questions or concerns. We can certainly see her  sooner if needed.   Laverna Peace, NP 7/13/202011:29 AM

## 2018-10-18 ENCOUNTER — Encounter: Payer: Self-pay | Admitting: Family

## 2018-10-18 LAB — FERRITIN: Ferritin: 68 ng/mL (ref 11–307)

## 2018-10-18 LAB — IRON AND TIBC
Iron: 57 ug/dL (ref 41–142)
Saturation Ratios: 16 % — ABNORMAL LOW (ref 21–57)
TIBC: 365 ug/dL (ref 236–444)
UIBC: 308 ug/dL (ref 120–384)

## 2018-10-19 ENCOUNTER — Telehealth: Payer: Self-pay | Admitting: Family

## 2018-10-19 ENCOUNTER — Encounter: Payer: Self-pay | Admitting: Family Medicine

## 2018-10-19 NOTE — Telephone Encounter (Signed)
Called and advised patient of appointment for Iron infusion for next week per 7/14 sch msg

## 2018-10-19 NOTE — Telephone Encounter (Signed)
Called and advised patient that per Burman Freestone, NP we are not providing any work notes at present for not being able to work outside

## 2018-10-25 ENCOUNTER — Encounter: Payer: Self-pay | Admitting: Family

## 2018-10-25 ENCOUNTER — Inpatient Hospital Stay: Payer: Medicaid Other

## 2018-10-25 ENCOUNTER — Other Ambulatory Visit: Payer: Self-pay

## 2018-10-25 VITALS — BP 139/92 | HR 86 | Temp 97.5°F | Resp 17

## 2018-10-25 DIAGNOSIS — N921 Excessive and frequent menstruation with irregular cycle: Secondary | ICD-10-CM

## 2018-10-25 DIAGNOSIS — D500 Iron deficiency anemia secondary to blood loss (chronic): Secondary | ICD-10-CM | POA: Diagnosis not present

## 2018-10-25 MED ORDER — SODIUM CHLORIDE 0.9 % IV SOLN
510.0000 mg | Freq: Once | INTRAVENOUS | Status: AC
  Administered 2018-10-25: 510 mg via INTRAVENOUS
  Filled 2018-10-25: qty 510

## 2018-10-25 MED ORDER — SODIUM CHLORIDE 0.9 % IV SOLN
INTRAVENOUS | Status: DC
  Administered 2018-10-25: 14:00:00 via INTRAVENOUS
  Filled 2018-10-25: qty 250

## 2018-10-25 NOTE — Patient Instructions (Signed)

## 2018-10-27 ENCOUNTER — Other Ambulatory Visit: Payer: Self-pay

## 2018-10-27 ENCOUNTER — Encounter: Payer: Self-pay | Admitting: Family Medicine

## 2018-10-27 ENCOUNTER — Ambulatory Visit (INDEPENDENT_AMBULATORY_CARE_PROVIDER_SITE_OTHER): Payer: Medicaid Other | Admitting: Family Medicine

## 2018-10-27 VITALS — BP 147/87 | HR 91 | Temp 98.6°F | Resp 18 | Ht 60.0 in | Wt 241.2 lb

## 2018-10-27 DIAGNOSIS — D821 Di George's syndrome: Secondary | ICD-10-CM

## 2018-10-27 DIAGNOSIS — J302 Other seasonal allergic rhinitis: Secondary | ICD-10-CM | POA: Diagnosis not present

## 2018-10-27 DIAGNOSIS — R21 Rash and other nonspecific skin eruption: Secondary | ICD-10-CM | POA: Insufficient documentation

## 2018-10-27 DIAGNOSIS — Z8279 Family history of other congenital malformations, deformations and chromosomal abnormalities: Secondary | ICD-10-CM

## 2018-10-27 MED ORDER — LEVOCETIRIZINE DIHYDROCHLORIDE 5 MG PO TABS: 5.0000 mg | ORAL_TABLET | Freq: Every evening | ORAL | 5 refills | Status: DC

## 2018-10-27 MED ORDER — MONTELUKAST SODIUM 10 MG PO TABS: 10.0000 mg | ORAL_TABLET | Freq: Every day | ORAL | 1 refills | Status: DC

## 2018-10-27 NOTE — Assessment & Plan Note (Signed)
New-- -? Allergic reaction Since resolved Take antihistamine daily  rto if returns Will get labs

## 2018-10-27 NOTE — Patient Instructions (Signed)
Rash, Adult A rash is a change in the color of your skin. A rash can also change the way your skin feels. There are many different conditions and factors that can cause a rash. Some rashes may disappear after a few days, but some may last for a few weeks. Common causes of rashes include:  Viral infections, such as: ? Colds. ? Measles. ? Hand, foot, and mouth disease.  Bacterial infections, such as: ? Scarlet fever. ? Impetigo.  Fungal infections, such as Candida.  Allergic reactions to food, medicines, or skin care products. Follow these instructions at home: The goal of treatment is to stop the itching and keep the rash from spreading. Pay attention to any changes in your symptoms. Follow these instructions to help with your condition: Medicine Take or apply over-the-counter and prescription medicines only as told by your health care provider. These may include:  Corticosteroid creams to treat red or swollen skin.  Anti-itch lotions.  Oral allergy medicines (antihistamines).  Oral corticosteroids for severe symptoms.  Skin care  Apply cool compresses to the affected areas.  Do not scratch or rub your skin.  Avoid covering the rash. Make sure the rash is exposed to air as much as possible. Managing itching and discomfort  Avoid hot showers or baths, which can make itching worse. A cold shower may help.  Try taking a bath with: ? Epsom salts. Follow manufacturer instructions on the packaging. You can get these at your local pharmacy or grocery store. ? Baking soda. Pour a small amount into the bath as told by your health care provider. ? Colloidal oatmeal. Follow manufacturer instructions on the packaging. You can get this at your local pharmacy or grocery store.  Try applying baking soda paste to your skin. Stir water into baking soda until it reaches a paste-like consistency.  Try applying calamine lotion. This is an over-the-counter lotion that helps to relieve  itchiness.  Keep cool and out of the sun. Sweating and being hot can make itching worse. General instructions   Rest as needed.  Drink enough fluid to keep your urine pale yellow.  Wear loose-fitting clothing.  Avoid scented soaps, detergents, and perfumes. Use gentle soaps, detergents, perfumes, and other cosmetic products.  Avoid any substance that causes your rash. Keep a journal to help track what causes your rash. Write down: ? What you eat. ? What cosmetic products you use. ? What you drink. ? What you wear. This includes jewelry.  Keep all follow-up visits as told by your health care provider. This is important. Contact a health care provider if:  You sweat at night.  You lose weight.  You urinate more than normal.  You urinate less than normal, or you notice that your urine is a darker color than usual.  You feel weak.  You vomit.  Your skin or the whites of your eyes look yellow (jaundice).  Your skin: ? Tingles. ? Is numb.  Your rash: ? Does not go away after several days. ? Gets worse.  You are: ? Unusually thirsty. ? More tired than normal.  You have: ? New symptoms. ? Pain in your abdomen. ? A fever. ? Diarrhea. Get help right away if you:  Have a fever and your symptoms suddenly get worse.  Develop confusion.  Have a severe headache or a stiff neck.  Have severe joint pains or stiffness.  Have a seizure.  Develop a rash that covers all or most of your body. The rash may   or may not be painful.  Develop blisters that: ? Are on top of the rash. ? Grow larger or grow together. ? Are painful. ? Are inside your nose or mouth.  Develop a rash that: ? Looks like purple pinprick-sized spots all over your body. ? Has a "bull's eye" or looks like a target. ? Is not related to sun exposure, is red and painful, and causes your skin to peel. Summary  A rash is a change in the color of your skin. Some rashes disappear after a few days,  but some may last for a few weeks.  The goal of treatment is to stop the itching and keep the rash from spreading.  Take or apply over-the-counter and prescription medicines only as told by your health care provider.  Contact a health care provider if you have new or worsening symptoms.  Keep all follow-up visits as told by your health care provider. This is important. This information is not intended to replace advice given to you by your health care provider. Make sure you discuss any questions you have with your health care provider. Document Released: 03/13/2002 Document Revised: 07/15/2018 Document Reviewed: 10/25/2017 Elsevier Patient Education  2020 Elsevier Inc.  

## 2018-10-27 NOTE — Progress Notes (Signed)
Patient ID: Cheryl Mullen, female    DOB: 08/09/1990  Age: 28 y.o. MRN: [id number]    Subjective:  Subjective  HPI Cheryl Mullen presents with c/o rash on arms and legs---- it has since resolved but did look like hives (she had pictures on her phone.)   Her daughter was dx with DiGeorge syndrome and she is requesting genetic testing and would like blood work for autoimmune disorders as well  Pt has no other complaints   Review of Systems  Constitutional: Negative for chills and fever.  HENT: Negative for congestion and hearing loss.   Eyes: Negative for discharge.  Respiratory: Negative for cough and shortness of breath.   Cardiovascular: Negative for chest pain, palpitations and leg swelling.  Gastrointestinal: Negative for abdominal pain, blood in stool, constipation, diarrhea, nausea and vomiting.  Genitourinary: Negative for dysuria, frequency, hematuria and urgency.  Musculoskeletal: Negative for back pain and myalgias.  Skin: Negative for rash.  Allergic/Immunologic: Negative for environmental allergies.  Neurological: Negative for dizziness, weakness and headaches.  Hematological: Does not bruise/bleed easily.  Psychiatric/Behavioral: Negative for suicidal ideas. The patient is not nervous/anxious.     History Past Medical History:  Diagnosis Date  . Allergy   . Anemia   . Eczema   . Gestational diabetes     She has a past surgical history that includes Tonsillectomy and adenoidectomy (Bilateral, 01/02/2013); Wisdom tooth extraction; and Cesarean section (N/A, 06/07/2017).   Her family history includes Diabetes in her maternal grandfather; Hyperlipidemia in her father and mother; Hypertension in her father, maternal grandfather, maternal grandmother, mother, paternal grandfather, and paternal grandmother.She reports that she has never smoked. She has never used smokeless tobacco. She reports current alcohol use. She reports that she does not use drugs.  Current Outpatient  Medications on File Prior to Visit  Medication Sig Dispense Refill  . norgestimate-ethinyl estradiol (ORTHO-CYCLEN) 0.25-35 MG-MCG tablet Take 1 tablet by mouth daily. 1 Package 11  . triamcinolone ointment (KENALOG) 0.1 % Apply 1 application topically 2 (two) times daily. 80 g 3   No current facility-administered medications on file prior to visit.      Objective:  Objective  Physical Exam Vitals signs and nursing note reviewed. Exam conducted with a chaperone present.  Constitutional:      General: She is not in acute distress.    Appearance: She is well-developed.  HENT:     Right Ear: External ear normal.     Left Ear: External ear normal.     Nose: Nose normal.  Eyes:     Pupils: Pupils are equal, round, and reactive to light.  Neck:     Musculoskeletal: Normal range of motion and neck supple.  Cardiovascular:     Rate and Rhythm: Normal rate and regular rhythm.     Heart sounds: Normal heart sounds. No murmur.  Pulmonary:     Effort: Pulmonary effort is normal. No respiratory distress.     Breath sounds: Normal breath sounds. No wheezing or rales.  Chest:     Chest wall: No tenderness.  Neurological:     Mental Status: She is alert and oriented to person, place, and time.  Psychiatric:        Behavior: Behavior normal.        Thought Content: Thought content normal.        Judgment: Judgment normal.    BP (!) 147/87 (BP Location: Left Arm, Patient Position: Sitting, Cuff Size: Normal)   Pulse 91   Temp 98.6  F (37 C) (Oral)   Resp 18   Ht 5' (1.524 m)   Wt 241 lb 3.2 oz (109.4 kg)   SpO2 100%   BMI 47.11 kg/m  Wt Readings from Last 3 Encounters:  10/27/18 241 lb 3.2 oz (109.4 kg)  10/17/18 237 lb 0.6 oz (107.5 kg)  09/29/18 240 lb (108.9 kg)     Lab Results  Component Value Date   WBC 5.7 10/17/2018   HGB 12.3 10/17/2018   HCT 36.9 10/17/2018   PLT 286 10/17/2018   GLUCOSE 94 06/18/2017   ALT 16 07/03/2015   AST 17 07/03/2015   NA 142 06/18/2017    K 4.8 06/18/2017   CL 104 06/18/2017   CREATININE 1.19 (H) 07/19/2017   BUN 22 (H) 06/18/2017   CO2 17 (L) 06/18/2017   TSH 0.95 05/29/2016   HGBA1C 5.3 12/17/2016    No results found.   Assessment & Plan:  Plan  I have changed Cheryl Mullen "Jay"'s montelukast. I am also having her start on levocetirizine. Additionally, I am having her maintain her norgestimate-ethinyl estradiol and triamcinolone ointment.  Meds ordered this encounter  Medications  . montelukast (SINGULAIR) 10 MG tablet    Sig: Take 1 tablet (10 mg total) by mouth at bedtime.    Dispense:  90 tablet    Refill:  1  . levocetirizine (XYZAL) 5 MG tablet    Sig: Take 1 tablet (5 mg total) by mouth every evening.    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 Allergic reaction Since resolved Take antihistamine daily  rto if returns Will get labs       Relevant Medications   levocetirizine (XYZAL) 5 MG tablet   Other Relevant Orders   TSH   CBC with Differential/Platelet   Comprehensive metabolic panel   Antinuclear Antib (ANA)   Sedimentation rate   Seasonal allergies - Primary   Relevant Medications   montelukast (SINGULAIR) 10 MG tablet   levocetirizine (XYZAL) 5 MG tablet   Other Relevant Orders   TSH   CBC with Differential/Platelet   Comprehensive metabolic panel   Antinuclear Antib (ANA)   Sedimentation rate    Other Visit Diagnoses    Family history of congenital or genetic condition       Relevant Orders   Ambulatory referral to Genetics      Follow-up: No follow-ups on file.  [name], DO

## 2018-10-27 NOTE — Assessment & Plan Note (Signed)
Family hx Genetic referral put in

## 2018-10-28 ENCOUNTER — Ambulatory Visit (INDEPENDENT_AMBULATORY_CARE_PROVIDER_SITE_OTHER): Payer: Medicaid Other | Admitting: Obstetrics & Gynecology

## 2018-10-28 ENCOUNTER — Encounter: Payer: Self-pay | Admitting: Obstetrics & Gynecology

## 2018-10-28 VITALS — BP 130/92 | Wt 237.0 lb

## 2018-10-28 DIAGNOSIS — N939 Abnormal uterine and vaginal bleeding, unspecified: Secondary | ICD-10-CM | POA: Diagnosis not present

## 2018-10-28 LAB — CBC WITH DIFFERENTIAL/PLATELET
Basophils Absolute: 0 10*3/uL (ref 0.0–0.1)
Basophils Relative: 0.7 % (ref 0.0–3.0)
Eosinophils Absolute: 0.1 10*3/uL (ref 0.0–0.7)
Eosinophils Relative: 1.6 % (ref 0.0–5.0)
HCT: 35.2 % — ABNORMAL LOW (ref 36.0–46.0)
Hemoglobin: 11.6 g/dL — ABNORMAL LOW (ref 12.0–15.0)
Lymphocytes Relative: 27.5 % (ref 12.0–46.0)
Lymphs Abs: 2 10*3/uL (ref 0.7–4.0)
MCHC: 32.9 g/dL (ref 30.0–36.0)
MCV: 88.2 fl (ref 78.0–100.0)
Monocytes Absolute: 0.5 10*3/uL (ref 0.1–1.0)
Monocytes Relative: 6.4 % (ref 3.0–12.0)
Neutro Abs: 4.6 10*3/uL (ref 1.4–7.7)
Neutrophils Relative %: 63.8 % (ref 43.0–77.0)
Platelets: 250 10*3/uL (ref 150.0–400.0)
RBC: 4 Mil/uL (ref 3.87–5.11)
RDW: 14.2 % (ref 11.5–15.5)
WBC: 7.2 10*3/uL (ref 4.0–10.5)

## 2018-10-28 LAB — COMPREHENSIVE METABOLIC PANEL
ALT: 47 U/L — ABNORMAL HIGH (ref 0–35)
AST: 46 U/L — ABNORMAL HIGH (ref 0–37)
Albumin: 4 g/dL (ref 3.5–5.2)
Alkaline Phosphatase: 56 U/L (ref 39–117)
BUN: 10 mg/dL (ref 6–23)
CO2: 26 mEq/L (ref 19–32)
Calcium: 8.7 mg/dL (ref 8.4–10.5)
Chloride: 106 mEq/L (ref 96–112)
Creatinine, Ser: 0.95 mg/dL (ref 0.40–1.20)
GFR: 84.85 mL/min (ref >60.00)
Glucose, Bld: 109 mg/dL — ABNORMAL HIGH (ref 70–99)
Potassium: 4 mEq/L (ref 3.5–5.1)
Sodium: 140 mEq/L (ref 135–145)
Total Bilirubin: 0.3 mg/dL (ref 0.2–1.2)
Total Protein: 7 g/dL (ref 6.0–8.3)

## 2018-10-28 LAB — TSH: TSH: 0.68 u[IU]/mL (ref 0.35–4.50)

## 2018-10-28 LAB — SEDIMENTATION RATE: Sed Rate: 21 mm/hr — ABNORMAL HIGH (ref 0–20)

## 2018-10-28 MED ORDER — MEGESTROL ACETATE 40 MG PO TABS: 40.0000 mg | ORAL_TABLET | Freq: Two times a day (BID) | ORAL | 5 refills | Status: DC

## 2018-10-28 NOTE — Progress Notes (Signed)
History:  28 y.o. G1P1001 here today for f/u of AUB. Pt reports that she has been bleeding for >60 days. She reports that she has not been sexually active because she is bleeding. She has not had a work up for her bleeding.      The following portions of the patient's history were reviewed and updated as appropriate: allergies, current medications, past family history, past medical history, past social history, past surgical history and problem list.  Review of Systems:  Pertinent items are noted in HPI.    Objective:  Physical Exam Blood pressure (!) 130/92, weight 237 lb (107.5 kg), last menstrual period 09/20/2018, currently breastfeeding.  CONSTITUTIONAL: Well-developed, well-nourished female in no acute distress.  HENT:  Normocephalic, atraumatic EYES: Conjunctivae and EOM are normal. No scleral icterus.  NECK: Normal range of motion SKIN: Skin is warm and dry. No rash noted. Not diaphoretic.No pallor. [place]: Alert and oriented to person, place, and time. Normal coordination.    Assessment & Plan:  AUB - suspect PCOS  Diagnoses and all orders for this visit:  Abnormal uterine bleeding (AUB) -     US PELVIS TRANSVAGINAL NON-OB (TV ONLY); Future -     megestrol (MEGACE) 40 MG tablet; Take 1 tablet (40 mg total) by mouth 2 (two) times daily. Can increase to two tablets twice a day in the event of heavy bleeding -     TSH+Prl+TestT+TestF+17OHP  Pt will f/u in 6 weeks. She is considering an LnIUD at that time if Megace stops the bleeding.   Total face-to-face time with patient was 18 min.  Greater than 50% was spent in counseling and coordination of care with the patient.   Genella Bas L. Harraway-Smith, M.D., Cherlynn June

## 2018-10-28 NOTE — Progress Notes (Signed)
Patient went without a period for 63 days. Patient started bleeding on June 16th and has not stopped. Patient started bleeding and now is having heavy bleeding x3 days (soaking super tampon and pad). Kathrene Alu RN

## 2018-10-28 NOTE — Patient Instructions (Signed)
Levonorgestrel intrauterine device (IUD) What is this medicine? LEVONORGESTREL IUD (LEE voe nor jes trel) is a contraceptive (birth control) device. The device is placed inside the uterus by a healthcare professional. It is used to prevent pregnancy. This device can also be used to treat heavy bleeding that occurs during your period. This medicine may be used for other purposes; ask your health care provider or pharmacist if you have questions. COMMON BRAND NAME(S): Kyleena, LILETTA, Mirena, Skyla What should I tell my health care provider before I take this medicine? They need to know if you have any of these conditions:  abnormal Pap smear  cancer of the breast, uterus, or cervix  diabetes  endometritis  genital or pelvic infection now or in the past  have more than one sexual partner or your partner has more than one partner  heart disease  history of an ectopic or tubal pregnancy  immune system problems  IUD in place  liver disease or tumor  problems with blood clots or take blood-thinners  seizures  use intravenous drugs  uterus of unusual shape  vaginal bleeding that has not been explained  an unusual or allergic reaction to levonorgestrel, other hormones, silicone, or polyethylene, medicines, foods, dyes, or preservatives  pregnant or trying to get pregnant  breast-feeding How should I use this medicine? This device is placed inside the uterus by a health care professional. Talk to your pediatrician regarding the use of this medicine in children. Special care may be needed. Overdosage: If you think you have taken too much of this medicine contact a poison control center or emergency room at once. NOTE: This medicine is only for you. Do not share this medicine with others. What if I miss a dose? This does not apply. Depending on the brand of device you have inserted, the device will need to be replaced every 3 to 6 years if you wish to continue using this type  of birth control. What may interact with this medicine? Do not take this medicine with any of the following medications:  amprenavir  bosentan  fosamprenavir This medicine may also interact with the following medications:  aprepitant  armodafinil  barbiturate medicines for inducing sleep or treating seizures  bexarotene  boceprevir  griseofulvin  medicines to treat seizures like carbamazepine, ethotoin, felbamate, oxcarbazepine, phenytoin, topiramate  modafinil  pioglitazone  rifabutin  rifampin  rifapentine  some medicines to treat HIV infection like atazanavir, efavirenz, indinavir, lopinavir, nelfinavir, tipranavir, ritonavir  St. John's wort  warfarin This list may not describe all possible interactions. Give your health care provider a list of all the medicines, herbs, non-prescription drugs, or dietary supplements you use. Also tell them if you smoke, drink alcohol, or use illegal drugs. Some items may interact with your medicine. What should I watch for while using this medicine? Visit your doctor or health care professional for regular check ups. See your doctor if you or your partner has sexual contact with others, becomes HIV positive, or gets a sexual transmitted disease. This product does not protect you against HIV infection (AIDS) or other sexually transmitted diseases. You can check the placement of the IUD yourself by reaching up to the top of your vagina with clean fingers to feel the threads. Do not pull on the threads. It is a good habit to check placement after each menstrual period. Call your doctor right away if you feel more of the IUD than just the threads or if you cannot feel the threads at   all. The IUD may come out by itself. You may become pregnant if the device comes out. If you notice that the IUD has come out use a backup birth control method like condoms and call your health care provider. Using tampons will not change the position of the  IUD and are okay to use during your period. This IUD can be safely scanned with magnetic resonance imaging (MRI) only under specific conditions. Before you have an MRI, tell your healthcare provider that you have an IUD in place, and which type of IUD you have in place. What side effects may I notice from receiving this medicine? Side effects that you should report to your doctor or health care professional as soon as possible:  allergic reactions like skin rash, itching or hives, swelling of the face, lips, or tongue  fever, flu-like symptoms  genital sores  high blood pressure  no menstrual period for 6 weeks during use  pain, swelling, warmth in the leg  pelvic pain or tenderness  severe or sudden headache  signs of pregnancy  stomach cramping  sudden shortness of breath  trouble with balance, talking, or walking  unusual vaginal bleeding, discharge  yellowing of the eyes or skin Side effects that usually do not require medical attention (report to your doctor or health care professional if they continue or are bothersome):  acne  breast pain  change in sex drive or performance  changes in weight  cramping, dizziness, or faintness while the device is being inserted  headache  irregular menstrual bleeding within first 3 to 6 months of use  nausea This list may not describe all possible side effects. Call your doctor for medical advice about side effects. You may report side effects to FDA at 1-800-FDA-1088. Where should I keep my medicine? This does not apply. NOTE: This sheet is a summary. It may not cover all possible information. If you have questions about this medicine, talk to your doctor, pharmacist, or health care provider.  2020 Elsevier/Gold Standard (2018-02-01 13:22:01)  

## 2018-10-29 LAB — ANA: Anti Nuclear Antibody (ANA): NEGATIVE

## 2018-10-31 ENCOUNTER — Encounter: Payer: Self-pay | Admitting: Obstetrics & Gynecology

## 2018-11-01 ENCOUNTER — Other Ambulatory Visit: Payer: Self-pay

## 2018-11-01 ENCOUNTER — Encounter (HOSPITAL_BASED_OUTPATIENT_CLINIC_OR_DEPARTMENT_OTHER): Payer: Self-pay

## 2018-11-01 ENCOUNTER — Ambulatory Visit (HOSPITAL_BASED_OUTPATIENT_CLINIC_OR_DEPARTMENT_OTHER)
Admission: RE | Admit: 2018-11-01 | Discharge: 2018-11-01 | Disposition: A | Discharge status: Home / Self Care | Payer: Medicaid Other | Source: Ambulatory Visit | Attending: Obstetrics & Gynecology | Admitting: Obstetrics & Gynecology

## 2018-11-01 DIAGNOSIS — N939 Abnormal uterine and vaginal bleeding, unspecified: Secondary | ICD-10-CM | POA: Insufficient documentation

## 2018-11-03 LAB — TSH+PRL+TESTT+TESTF+17OHP
17-Hydroxyprogesterone: 31 ng/dL
Prolactin: 13.3 ng/mL (ref 4.8–23.3)
TSH: 0.684 u[IU]/mL (ref 0.450–4.500)
Testosterone, Free: 1.2 pg/mL (ref 0.0–4.2)
Testosterone, Total, LC/MS: 32 ng/dL (ref 10.0–55.0)

## 2018-12-09 ENCOUNTER — Other Ambulatory Visit: Payer: Self-pay

## 2018-12-09 ENCOUNTER — Ambulatory Visit (INDEPENDENT_AMBULATORY_CARE_PROVIDER_SITE_OTHER): Payer: Medicaid Other | Admitting: Obstetrics & Gynecology

## 2018-12-09 ENCOUNTER — Encounter: Payer: Self-pay | Admitting: Obstetrics & Gynecology

## 2018-12-09 VITALS — BP 130/64 | HR 81 | Ht 63.0 in | Wt 231.0 lb

## 2018-12-09 DIAGNOSIS — N939 Abnormal uterine and vaginal bleeding, unspecified: Secondary | ICD-10-CM

## 2018-12-09 DIAGNOSIS — Z3202 Encounter for pregnancy test, result negative: Secondary | ICD-10-CM

## 2018-12-09 DIAGNOSIS — Z30430 Encounter for insertion of intrauterine contraceptive device: Secondary | ICD-10-CM

## 2018-12-09 LAB — POCT URINE PREGNANCY: Preg Test, Ur: NEGATIVE
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
## 2018-12-22 ENCOUNTER — Ambulatory Visit: Payer: Medicaid Other | Admitting: Family Medicine
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
## 2018-12-30 ENCOUNTER — Ambulatory Visit: Payer: Medicaid Other | Admitting: Family Medicine

## 2019-02-06 ENCOUNTER — Encounter: Payer: Self-pay | Admitting: Family Medicine

## 2019-02-06 ENCOUNTER — Ambulatory Visit (INDEPENDENT_AMBULATORY_CARE_PROVIDER_SITE_OTHER): Payer: Medicaid Other | Admitting: Family Medicine

## 2019-02-06 ENCOUNTER — Other Ambulatory Visit: Payer: Self-pay

## 2019-02-06 VITALS — Wt 229.0 lb

## 2019-02-06 DIAGNOSIS — M5441 Lumbago with sciatica, right side: Secondary | ICD-10-CM

## 2019-02-06 DIAGNOSIS — M5442 Lumbago with sciatica, left side: Secondary | ICD-10-CM | POA: Diagnosis not present

## 2019-02-06 MED ORDER — CYCLOBENZAPRINE HCL 10 MG PO TABS: 10.0000 mg | ORAL_TABLET | Freq: Three times a day (TID) | ORAL | 0 refills | Status: DC | PRN

## 2019-02-06 MED ORDER — PREDNISONE 10 MG PO TABS: ORAL_TABLET | ORAL | 0 refills | Status: DC

## 2019-02-06 NOTE — Progress Notes (Signed)
Virtual Visit via Video Note  I connected with Myrtis Ser on 02/06/19 at  3:00 PM EST by a video enabled telemedicine application and verified that I am speaking with the correct person using two identifiers.  Location: Patient: in car Provider: office    I discussed the limitations of evaluation and management by telemedicine and the availability of in person appointments. The patient expressed understanding and agreed to proceed.  History of Present Illness: Pt c/o low back pain that radiates to the buttocks  X 1 week    Tylenol does not help  Pain has been constant   Observations/Objective: No vitals obtained Pt is in NAD  Assessment and Plan: 1. Acute bilateral low back pain with bilateral sciatica Muscle relaxer and pred taper F/u 10-14 days or sooner prn  - predniSONE (DELTASONE) 10 MG tablet; TAKE 3 TABLETS PO QD FOR 3 DAYS THEN TAKE 2 TABLETS PO QD FOR 3 DAYS THEN TAKE 1 TABLET PO QD FOR 3 DAYS THEN TAKE 1/2 TAB PO QD FOR 3 DAYS  Dispense: 20 tablet; Refill: 0 - cyclobenzaprine (FLEXERIL) 10 MG tablet; Take 1 tablet (10 mg total) by mouth 3 (three) times daily as needed for muscle spasms.  Dispense: 30 tablet; Refill: 0   Follow Up Instructions:    I discussed the assessment and treatment plan with the patient. The patient was provided an opportunity to ask questions and all were answered. The patient agreed with the plan and demonstrated an understanding of the instructions.   The patient was advised to call back or seek an in-person evaluation if the symptoms worsen or if the condition fails to improve as anticipated.  I provided 15 minutes of non-face-to-face time during this encounter.   [name], DO

## 2019-03-09 ENCOUNTER — Telehealth: Payer: Self-pay | Admitting: *Deleted

## 2019-03-09 ENCOUNTER — Encounter: Payer: Self-pay | Admitting: Family Medicine

## 2019-03-09 ENCOUNTER — Ambulatory Visit (INDEPENDENT_AMBULATORY_CARE_PROVIDER_SITE_OTHER): Payer: Self-pay | Admitting: Family Medicine

## 2019-03-09 ENCOUNTER — Other Ambulatory Visit: Payer: Self-pay

## 2019-03-09 ENCOUNTER — Ambulatory Visit: Payer: Medicaid Other | Admitting: Family Medicine

## 2019-03-09 DIAGNOSIS — J0140 Acute pansinusitis, unspecified: Secondary | ICD-10-CM

## 2019-03-09 DIAGNOSIS — J324 Chronic pansinusitis: Secondary | ICD-10-CM

## 2019-03-09 MED ORDER — PREDNISONE 20 MG PO TABS: 40.0000 mg | ORAL_TABLET | Freq: Every day | ORAL | 0 refills | Status: DC

## 2019-03-09 MED ORDER — AMOXICILLIN-POT CLAVULANATE 600-42.9 MG/5ML PO SUSR: ORAL | 0 refills | Status: DC

## 2019-03-09 NOTE — Telephone Encounter (Signed)
Scheduling Inquiry for Clinic >> Mar 09, 2019  8:40 AM Cheryl Mullen wrote: Reason for CRM:   Pt called to see if she can be seen earlier than 2:40 today.  Tried office, no answer.

## 2019-03-09 NOTE — Progress Notes (Signed)
Virtual Visit via Video Note  I connected with Cheryl Mullen on 03/09/19 at  2:20 PM EST by a video enabled telemedicine application and verified that I am speaking with the correct person using two identifiers.  Location: Patient: in car Provider: office    I discussed the limitations of evaluation and management by telemedicine and the availability of in person appointments. The patient expressed understanding and agreed to proceed.  History of Present Illness: Pt is in her car-- c/o 3 day hx of sinus pressure -- she was tested for covid the day before thanksgiving  She is taking xyzal and singulair     Observations/Objective: No fever-- no other vitals obtained Pt is in NAD  Assessment and Plan: 1. Pansinusitis, unspecified chronicity pred taper and abx per orders Saline spray - predniSONE (DELTASONE) 20 MG tablet; Take 2 tablets (40 mg total) by mouth daily.  Dispense: 10 tablet; Refill: 0 - amoxicillin-clavulanate (AUGMENTIN ES-600) 600-42.9 MG/5ML suspension; 1  1/2 tsp bid x 10 days  Dispense: 200 mL; Refill: 0  Follow Up Instructions:    I discussed the assessment and treatment plan with the patient. The patient was provided an opportunity to ask questions and all were answered. The patient agreed with the plan and demonstrated an understanding of the instructions.   The patient was advised to call back or seek an in-person evaluation if the symptoms worsen or if the condition fails to improve as anticipated.  I provided 15 minutes of non-face-to-face time during this encounter.   [name], DO

## 2019-03-09 NOTE — Telephone Encounter (Deleted)
Copied from [place] [id number]. Topic: Appointment Scheduling - Scheduling Inquiry for Clinic >> Mar 09, 2019  8:40 AM Reyne Dumas L wrote: Reason for CRM:   Pt called to see if she can be seen earlier than 2:40 today.  Tried office, no answer.

## 2019-03-09 NOTE — Telephone Encounter (Signed)
Spoke with pt and scheduled her for 2:20 today.

## 2019-04-19 ENCOUNTER — Inpatient Hospital Stay: Payer: Medicaid Other

## 2019-04-19 ENCOUNTER — Inpatient Hospital Stay: Payer: Medicaid Other | Attending: Family | Admitting: Family

## 2019-04-19 ENCOUNTER — Other Ambulatory Visit: Payer: Self-pay

## 2019-04-19 ENCOUNTER — Encounter: Payer: Self-pay | Admitting: Family

## 2019-04-19 VITALS — BP 130/73 | HR 87 | Temp 97.6°F | Resp 18 | Ht 60.0 in | Wt 235.0 lb

## 2019-04-19 DIAGNOSIS — D500 Iron deficiency anemia secondary to blood loss (chronic): Secondary | ICD-10-CM | POA: Insufficient documentation

## 2019-04-19 DIAGNOSIS — R0981 Nasal congestion: Secondary | ICD-10-CM | POA: Insufficient documentation

## 2019-04-19 DIAGNOSIS — N921 Excessive and frequent menstruation with irregular cycle: Secondary | ICD-10-CM

## 2019-04-19 DIAGNOSIS — Z79899 Other long term (current) drug therapy: Secondary | ICD-10-CM | POA: Diagnosis not present

## 2019-04-19 DIAGNOSIS — N920 Excessive and frequent menstruation with regular cycle: Secondary | ICD-10-CM | POA: Diagnosis not present

## 2019-04-19 DIAGNOSIS — D573 Sickle-cell trait: Secondary | ICD-10-CM

## 2019-04-19 LAB — RETICULOCYTES
Immature Retic Fract: 12.7 % (ref 2.3–15.9)
RBC.: 4.57 MIL/uL (ref 3.87–5.11)
Retic Count, Absolute: 89.6 10*3/uL (ref 19.0–186.0)
Retic Ct Pct: 2 % (ref 0.4–3.1)

## 2019-04-19 LAB — CBC WITH DIFFERENTIAL (CANCER CENTER ONLY)
Abs Immature Granulocytes: 0.01 10*3/uL (ref 0.00–0.07)
Basophils Absolute: 0 10*3/uL (ref 0.0–0.1)
Basophils Relative: 0 %
Eosinophils Absolute: 0.1 10*3/uL (ref 0.0–0.5)
Eosinophils Relative: 2 %
HCT: 38.4 % (ref 36.0–46.0)
Hemoglobin: 13 g/dL (ref 12.0–15.0)
Immature Granulocytes: 0 %
Lymphocytes Relative: 41 %
Lymphs Abs: 2.7 10*3/uL (ref 0.7–4.0)
MCH: 28.4 pg (ref 26.0–34.0)
MCHC: 33.9 g/dL (ref 30.0–36.0)
MCV: 83.8 fL (ref 80.0–100.0)
Monocytes Absolute: 0.4 10*3/uL (ref 0.1–1.0)
Monocytes Relative: 6 %
Neutro Abs: 3.4 10*3/uL (ref 1.7–7.7)
Neutrophils Relative %: 51 %
Platelet Count: 298 10*3/uL (ref 150–400)
RBC: 4.58 MIL/uL (ref 3.87–5.11)
RDW: 13.2 % (ref 11.5–15.5)
WBC Count: 6.6 10*3/uL (ref 4.0–10.5)
nRBC: 0 % (ref 0.0–0.2)

## 2019-04-19 NOTE — Progress Notes (Signed)
Hematology and Oncology Follow Up Visit  Cheryl Mullen [id number] 07-04-1990 29 y.o. 04/19/2019   Principle Diagnosis:  Iron deficiency anemia secondary to menorrhagia  Current Therapy:   IV iron as indicated    Interim History:  Cheryl Mullen is here today for follow-up. She is doing well but has noted some recent headaches. She has issues with sinus congestion and pressure with weather changes.  She denies fatigue.  She has been taking Megace as needed for heavy menstrual bleeding and this helps stop her cycle. She has had no other blood loss. No bruising or petechiae.  No fever, chills, n/v, cough, rash, dizziness, SOB, chest pain, palpitations, abdominal pain or changes in bowel or bladder habits.  No swelling, tenderness, numbness or tingling in her extremities.  No falls or syncopal episodes.  She has maintained a good appetite and is staying well hydrated. Her weight is stable.   ECOG Performance Status: 1 - Symptomatic but completely ambulatory  Medications:  Allergies as of 04/19/2019      Reactions   Prednisone Other (See Comments)   Hear rate speeds up   Nickel Hives      Medication List       Accurate as of April 19, 2019 11:33 AM. If you have any questions, ask your nurse or doctor.        STOP taking these medications   amoxicillin-clavulanate 600-42.9 MG/5ML suspension Commonly known as: Augmentin ES-600 Stopped by: Laverna Peace, NP   predniSONE 10 MG tablet Commonly known as: DELTASONE Stopped by: Laverna Peace, NP   predniSONE 20 MG tablet Commonly known as: DELTASONE Stopped by: Laverna Peace, NP     TAKE these medications   cyclobenzaprine 10 MG tablet Commonly known as: FLEXERIL Take 1 tablet (10 mg total) by mouth 3 (three) times daily as needed for muscle spasms.   levocetirizine 5 MG tablet Commonly known as: Xyzal Take 1 tablet (5 mg total) by mouth every evening.   megestrol 40 MG tablet Commonly known as: MEGACE Take  1 tablet (40 mg total) by mouth 2 (two) times daily. Can increase to two tablets twice a day in the event of heavy bleeding   montelukast 10 MG tablet Commonly known as: SINGULAIR Take 1 tablet (10 mg total) by mouth at bedtime.   triamcinolone ointment 0.1 % Commonly known as: KENALOG Apply 1 application topically 2 (two) times daily.       Allergies:  Allergies  Allergen Reactions  . Prednisone Other (See Comments)    Hear rate speeds up  . Nickel Hives    Past Medical History, Surgical history, Social history, and Family History were reviewed and updated.  Review of Systems: All other 10 point review of systems is negative.   Physical Exam:  height is 5' (1.524 m) and weight is 235 lb (106.6 kg). Her temporal temperature is 97.6 F (36.4 C). Her blood pressure is 130/73 and her pulse is 87. Her respiration is 18 and oxygen saturation is 100%.   Wt Readings from Last 3 Encounters:  04/19/19 235 lb (106.6 kg)  02/06/19 229 lb (103.9 kg)  12/09/18 231 lb (104.8 kg)    Ocular: Sclerae unicteric, pupils equal, round and reactive to light Ear-nose-throat: Oropharynx clear, dentition fair Lymphatic: No cervical or supraclavicular adenopathy Lungs no rales or rhonchi, good excursion bilaterally Heart regular rate and rhythm, no murmur appreciated Abd soft, nontender, positive bowel sounds, no liver or spleen tip palpated on exam, no fluid wave  MSK no  focal spinal tenderness, no joint edema Neuro: non-focal, well-oriented, appropriate affect Breasts: Deferred   Lab Results  Component Value Date   WBC 6.6 04/19/2019   HGB 13.0 04/19/2019   HCT 38.4 04/19/2019   MCV 83.8 04/19/2019   PLT 298 04/19/2019   Lab Results  Component Value Date   FERRITIN 68 10/17/2018   IRON 57 10/17/2018   TIBC 365 10/17/2018   UIBC 308 10/17/2018   IRONPCTSAT 16 (L) 10/17/2018   Lab Results  Component Value Date   RETICCTPCT 2.0 04/19/2019   RBC 4.57 04/19/2019   RBC 4.58  04/19/2019   No results found for: KPAFRELGTCHN, LAMBDASER, KAPLAMBRATIO No results found for: IGGSERUM, IGA, IGMSERUM No results found for: Marda Stalker, SPEI   Chemistry      Component Value Date/Time   NA 140 10/27/2018 1417   NA 142 06/18/2017 1140   K 4.0 10/27/2018 1417   CL 106 10/27/2018 1417   CO2 26 10/27/2018 1417   BUN 10 10/27/2018 1417   BUN 22 (H) 06/18/2017 1140   CREATININE 0.95 10/27/2018 1417      Component Value Date/Time   CALCIUM 8.7 10/27/2018 1417   ALKPHOS 56 10/27/2018 1417   AST 46 (H) 10/27/2018 1417   ALT 47 (H) 10/27/2018 1417   BILITOT 0.3 10/27/2018 1417       Impression and Plan: Cheryl Mullen is a very pleasant 29 yo African American female with iron deficiency anemia secondary to heavy cycles.  Iron studies are pending. We will replace if needed.  We will plan to see her back in another 6 months.  She will contact our office with any questions or concerns. We can certainly see her sooner if needed.   Emeline Gins, NP 1/13/202111:33 AM

## 2019-04-20 LAB — FERRITIN: Ferritin: 92 ng/mL (ref 11–307)

## 2019-04-20 LAB — IRON AND TIBC
Iron: 58 ug/dL (ref 41–142)
Saturation Ratios: 20 % — ABNORMAL LOW (ref 21–57)
TIBC: 293 ug/dL (ref 236–444)
UIBC: 235 ug/dL (ref 120–384)

## 2019-04-21 ENCOUNTER — Telehealth: Payer: Self-pay | Admitting: Family

## 2019-04-21 NOTE — Telephone Encounter (Signed)
lvm to inform patient of iron appt 1/21 at 130 pm per 1/14 sch msg

## 2019-04-27 ENCOUNTER — Other Ambulatory Visit: Payer: Self-pay

## 2019-04-27 ENCOUNTER — Inpatient Hospital Stay: Payer: Medicaid Other

## 2019-04-27 ENCOUNTER — Ambulatory Visit (INDEPENDENT_AMBULATORY_CARE_PROVIDER_SITE_OTHER): Payer: Medicaid Other

## 2019-04-27 VITALS — BP 125/78 | HR 86 | Temp 97.0°F | Resp 18

## 2019-04-27 DIAGNOSIS — D500 Iron deficiency anemia secondary to blood loss (chronic): Secondary | ICD-10-CM

## 2019-04-27 DIAGNOSIS — N921 Excessive and frequent menstruation with irregular cycle: Secondary | ICD-10-CM

## 2019-04-27 DIAGNOSIS — R319 Hematuria, unspecified: Secondary | ICD-10-CM

## 2019-04-27 LAB — POCT URINALYSIS DIPSTICK
Glucose, UA: NEGATIVE
Ketones, UA: POSITIVE
Nitrite, UA: POSITIVE
Spec Grav, UA: >=1.03 — AB (ref 1.010–1.025)
pH, UA: 6.5 (ref 5.0–8.0)

## 2019-04-27 MED ORDER — SULFAMETHOXAZOLE-TRIMETHOPRIM 800-160 MG PO TABS: 1.0000 | ORAL_TABLET | Freq: Two times a day (BID) | ORAL | 0 refills | Status: DC

## 2019-04-27 MED ORDER — SODIUM CHLORIDE 0.9 % IV SOLN
510.0000 mg | Freq: Once | INTRAVENOUS | Status: AC
  Administered 2019-04-27: 14:00:00 510 mg via INTRAVENOUS
  Filled 2019-04-27: qty 17

## 2019-04-27 NOTE — Patient Instructions (Signed)

## 2019-04-27 NOTE — Progress Notes (Signed)
Patient presents for urine dip and culture (speaking with Dr. Adrian Blackwater via Mychart).  Consulted with Dr. Adrian Blackwater about urine dip results and he would like to culture and treat her for UTI. [name] RN

## 2019-04-28 NOTE — Progress Notes (Signed)
Chart reviewed - agree with CMA/RN documentation.  ° °

## 2019-04-29 LAB — URINE CULTURE

## 2019-08-29 ENCOUNTER — Other Ambulatory Visit: Payer: Self-pay

## 2019-08-29 DIAGNOSIS — B379 Candidiasis, unspecified: Secondary | ICD-10-CM

## 2019-08-29 MED ORDER — FLUCONAZOLE 150 MG PO TABS: 150.0000 mg | ORAL_TABLET | Freq: Once | ORAL | 0 refills | Status: AC

## 2019-08-29 NOTE — Progress Notes (Signed)
Pt sent Mychart message stating she is itching and thinks she has a yeast infection. Diflucan was sent to pt's pharmacy. chiquita l wilson, CMA

## 2019-09-12 ENCOUNTER — Encounter: Payer: Self-pay | Admitting: Family Medicine

## 2019-09-14 ENCOUNTER — Encounter: Payer: Self-pay | Admitting: Family Medicine

## 2019-09-14 ENCOUNTER — Ambulatory Visit (INDEPENDENT_AMBULATORY_CARE_PROVIDER_SITE_OTHER): Payer: Medicaid Other | Admitting: Family Medicine

## 2019-09-14 ENCOUNTER — Other Ambulatory Visit: Payer: Self-pay

## 2019-09-14 VITALS — BP 118/78 | HR 99 | Temp 97.2°F | Resp 17 | Ht 60.0 in | Wt 247.0 lb

## 2019-09-14 DIAGNOSIS — D509 Iron deficiency anemia, unspecified: Secondary | ICD-10-CM

## 2019-09-14 DIAGNOSIS — R5383 Other fatigue: Secondary | ICD-10-CM

## 2019-09-14 LAB — POCT URINE PREGNANCY: Preg Test, Ur: NEGATIVE

## 2019-09-14 NOTE — Patient Instructions (Signed)

## 2019-09-14 NOTE — Assessment & Plan Note (Signed)
Check labs May need to see hematology sooner than next month

## 2019-09-14 NOTE — Assessment & Plan Note (Signed)
Check labs prob from iron def anemia

## 2019-09-14 NOTE — Progress Notes (Signed)
Patient ID: Cheryl Mullen, female    DOB: March 31, 1991  Age: 29 y.o. MRN: [id number]    Subjective:  Subjective  HPI    Cheryl Mullen presents for extreme fatigue -- she has a hx of iron deficiency anemia no other complaints   Review of Systems  Constitutional: Negative for appetite change, diaphoresis, fatigue and unexpected weight change.  Eyes: Negative for pain, redness and visual disturbance.  Respiratory: Negative for cough, chest tightness, shortness of breath and wheezing.   Cardiovascular: Negative for chest pain, palpitations and leg swelling.  Endocrine: Negative for cold intolerance, heat intolerance, polydipsia, polyphagia and polyuria.  Genitourinary: Negative for difficulty urinating, dysuria and frequency.  Neurological: Negative for dizziness, light-headedness, numbness and headaches.    History Past Medical History:  Diagnosis Date  . Allergy   . Anemia   . Eczema   . Gestational diabetes     She has a past surgical history that includes Tonsillectomy and adenoidectomy (Bilateral, 01/02/2013); Wisdom tooth extraction; and Cesarean section (N/A, 06/07/2017).   Her family history includes Diabetes in her maternal grandfather; Hyperlipidemia in her father and mother; Hypertension in her father, maternal grandfather, maternal grandmother, mother, paternal grandfather, and paternal grandmother.She reports that she has never smoked. She has never used smokeless tobacco. She reports current alcohol use. She reports that she does not use drugs.  Current Outpatient Medications on File Prior to Visit  Medication Sig Dispense Refill  . levocetirizine (XYZAL) 5 MG tablet Take 1 tablet (5 mg total) by mouth every evening. 30 tablet 5  . megestrol (MEGACE) 40 MG tablet Take 1 tablet (40 mg total) by mouth 2 (two) times daily. Can increase to two tablets twice a day in the event of heavy bleeding 60 tablet 5  . montelukast (SINGULAIR) 10 MG tablet Take 1 tablet (10 mg total) by  mouth at bedtime. 90 tablet 1  . triamcinolone ointment (KENALOG) 0.1 % Apply 1 application topically 2 (two) times daily. 80 g 3   No current facility-administered medications on file prior to visit.     Objective:  Objective  Physical Exam BP 118/78 (BP Location: Right Arm, Patient Position: Sitting, Cuff Size: Large)   Pulse 99   Temp (!) 97.2 F (36.2 C) (Temporal)   Resp 17   Ht 5' (1.524 m)   Wt 247 lb (112 kg)   SpO2 95%   BMI 48.24 kg/m  Wt Readings from Last 3 Encounters:  09/14/19 247 lb (112 kg)  04/19/19 235 lb (106.6 kg)  02/06/19 229 lb (103.9 kg)     Lab Results  Component Value Date   WBC 6.6 04/19/2019   HGB 13.0 04/19/2019   HCT 38.4 04/19/2019   PLT 298 04/19/2019   GLUCOSE 109 (H) 10/27/2018   ALT 47 (H) 10/27/2018   AST 46 (H) 10/27/2018   NA 140 10/27/2018   K 4.0 10/27/2018   CL 106 10/27/2018   CREATININE 0.95 10/27/2018   BUN 10 10/27/2018   CO2 26 10/27/2018   TSH 0.684 10/28/2018   HGBA1C 5.3 12/17/2016    US PELVIS TRANSVAGINAL NON-OB (TV ONLY)  Result Date: 11/01/2018 CLINICAL DATA:  Abnormal uterine bleeding, bleeding for more than 60 days, irregular menses, G1P1 EXAM: ULTRASOUND PELVIS TRANSVAGINAL TECHNIQUE: Transvaginal ultrasound examination of the pelvis was performed including evaluation of the uterus, ovaries, adnexal regions, and pelvic cul-de-sac. COMPARISON:  None FINDINGS: Uterus Measurements: 8.6 x 4.0 x 4.5 cm = volume: 81 mL. Anteverted. Normal morphology without mass. Few nabothian  cysts at cervix. Endometrium Thickness: 4.0 mm.  No endometrial fluid or focal abnormality Right ovary Measurements: 2.2 x 2.5 x 2.3 cm = volume: 6.6 mL. Suboptimally visualized due to bowel in position. No gross evidence of mass period Left ovary Measurements: 3.0 x 1.7 x 2.9 cm = volume: 7.8 mL. Normal morphology without mass Other findings:  Trace free pelvic fluid.  No adnexal masses. IMPRESSION: No significant pelvic sonographic abnormalities.  Electronically Signed   By: Lavonia Dana M.D.   On: 11/01/2018 16:26     Assessment & Plan:  Plan  I have discontinued Cheryl Mullen "Jay"'s cyclobenzaprine and sulfamethoxazole-trimethoprim. I am also having her maintain her triamcinolone ointment, montelukast, levocetirizine, and megestrol.  No orders of the defined types were placed in this encounter.   Problem List Items Addressed This Visit      Unprioritized   IDA (iron deficiency anemia) (Chronic)    Check labs May need to see hematology sooner than next month      Relevant Orders   CBC with Differential/Platelet   TSH   Vitamin B12   Comprehensive metabolic panel   Vitamin D (25 hydroxy)   IBC + Ferritin   Other fatigue - Primary    Check labs prob from iron def anemia       Relevant Orders   CBC with Differential/Platelet   TSH   Vitamin B12   Comprehensive metabolic panel   Vitamin D (25 hydroxy)   IBC + Ferritin   POCT urine pregnancy (Completed)      Follow-up: No follow-ups on file.  [name], DO

## 2019-09-15 ENCOUNTER — Encounter: Payer: Self-pay | Admitting: Family Medicine

## 2019-09-15 LAB — CBC WITH DIFFERENTIAL/PLATELET
Basophils Absolute: 0.1 10*3/uL (ref 0.0–0.1)
Basophils Relative: 1.2 % (ref 0.0–3.0)
Eosinophils Absolute: 0.1 10*3/uL (ref 0.0–0.7)
Eosinophils Relative: 1.7 % (ref 0.0–5.0)
HCT: 38.3 % (ref 36.0–46.0)
Hemoglobin: 13.1 g/dL (ref 12.0–15.0)
Lymphocytes Relative: 35.8 % (ref 12.0–46.0)
Lymphs Abs: 2.8 10*3/uL (ref 0.7–4.0)
MCHC: 34.2 g/dL (ref 30.0–36.0)
MCV: 88.9 fl (ref 78.0–100.0)
Monocytes Absolute: 0.5 10*3/uL (ref 0.1–1.0)
Monocytes Relative: 6.1 % (ref 3.0–12.0)
Neutro Abs: 4.3 10*3/uL (ref 1.4–7.7)
Neutrophils Relative %: 55.2 % (ref 43.0–77.0)
Platelets: 242 10*3/uL (ref 150.0–400.0)
RBC: 4.31 Mil/uL (ref 3.87–5.11)
RDW: 13.6 % (ref 11.5–15.5)
WBC: 7.9 10*3/uL (ref 4.0–10.5)

## 2019-09-15 LAB — COMPREHENSIVE METABOLIC PANEL
ALT: 19 U/L (ref 0–35)
AST: 17 U/L (ref 0–37)
Albumin: 4.3 g/dL (ref 3.5–5.2)
Alkaline Phosphatase: 78 U/L (ref 39–117)
BUN: 11 mg/dL (ref 6–23)
CO2: 29 mEq/L (ref 19–32)
Calcium: 9.4 mg/dL (ref 8.4–10.5)
Chloride: 103 mEq/L (ref 96–112)
Creatinine, Ser: 1 mg/dL (ref 0.40–1.20)
GFR: 79.47 mL/min (ref >60.00)
Glucose, Bld: 100 mg/dL — ABNORMAL HIGH (ref 70–99)
Potassium: 4 mEq/L (ref 3.5–5.1)
Sodium: 138 mEq/L (ref 135–145)
Total Bilirubin: 0.3 mg/dL (ref 0.2–1.2)
Total Protein: 7.3 g/dL (ref 6.0–8.3)

## 2019-09-15 LAB — VITAMIN B12: Vitamin B-12: 228 pg/mL (ref 211–911)

## 2019-09-15 LAB — IBC + FERRITIN
Ferritin: 178.6 ng/mL (ref 10.0–291.0)
Iron: 58 ug/dL (ref 42–145)
Saturation Ratios: 20.7 % (ref 20.0–50.0)
Transferrin: 200 mg/dL — ABNORMAL LOW (ref 212.0–360.0)

## 2019-09-15 LAB — TSH: TSH: 1.14 u[IU]/mL (ref 0.35–4.50)

## 2019-09-15 LAB — VITAMIN D 25 HYDROXY (VIT D DEFICIENCY, FRACTURES): VITD: 20.31 ng/mL — ABNORMAL LOW (ref 30.00–100.00)

## 2019-09-17 NOTE — Telephone Encounter (Signed)
See labs 

## 2019-09-18 ENCOUNTER — Other Ambulatory Visit: Payer: Self-pay

## 2019-09-18 MED ORDER — VITAMIN D (ERGOCALCIFEROL) 1.25 MG (50000 UNIT) PO CAPS
50000.0000 [IU] | ORAL_CAPSULE | ORAL | 1 refills | Status: DC
Start: 2019-09-18 — End: 2019-10-18

## 2019-09-18 NOTE — Telephone Encounter (Signed)
Pt viewed via Mychart 

## 2019-10-18 ENCOUNTER — Other Ambulatory Visit: Payer: Self-pay

## 2019-10-18 ENCOUNTER — Telehealth: Payer: Self-pay | Admitting: Family Medicine

## 2019-10-18 ENCOUNTER — Encounter: Payer: Self-pay | Admitting: Family

## 2019-10-18 ENCOUNTER — Inpatient Hospital Stay (HOSPITAL_BASED_OUTPATIENT_CLINIC_OR_DEPARTMENT_OTHER): Payer: Medicaid Other | Admitting: Family

## 2019-10-18 ENCOUNTER — Telehealth: Payer: Self-pay | Admitting: Family

## 2019-10-18 ENCOUNTER — Inpatient Hospital Stay: Payer: Medicaid Other | Attending: Hematology & Oncology

## 2019-10-18 VITALS — BP 131/89 | HR 78 | Temp 98.7°F | Resp 16 | Ht 60.0 in | Wt 246.0 lb

## 2019-10-18 DIAGNOSIS — D573 Sickle-cell trait: Secondary | ICD-10-CM | POA: Diagnosis not present

## 2019-10-18 DIAGNOSIS — N920 Excessive and frequent menstruation with regular cycle: Secondary | ICD-10-CM | POA: Insufficient documentation

## 2019-10-18 DIAGNOSIS — N921 Excessive and frequent menstruation with irregular cycle: Secondary | ICD-10-CM

## 2019-10-18 DIAGNOSIS — D500 Iron deficiency anemia secondary to blood loss (chronic): Secondary | ICD-10-CM | POA: Insufficient documentation

## 2019-10-18 LAB — CBC WITH DIFFERENTIAL (CANCER CENTER ONLY)
Abs Immature Granulocytes: 0.16 10*3/uL — ABNORMAL HIGH (ref 0.00–0.07)
Basophils Absolute: 0 10*3/uL (ref 0.0–0.1)
Basophils Relative: 0 %
Eosinophils Absolute: 0.1 10*3/uL (ref 0.0–0.5)
Eosinophils Relative: 1 %
HCT: 38.4 % (ref 36.0–46.0)
Hemoglobin: 13 g/dL (ref 12.0–15.0)
Immature Granulocytes: 1 %
Lymphocytes Relative: 33 %
Lymphs Abs: 3.7 10*3/uL (ref 0.7–4.0)
MCH: 29.3 pg (ref 26.0–34.0)
MCHC: 33.9 g/dL (ref 30.0–36.0)
MCV: 86.5 fL (ref 80.0–100.0)
Monocytes Absolute: 0.5 10*3/uL (ref 0.1–1.0)
Monocytes Relative: 5 %
Neutro Abs: 6.6 10*3/uL (ref 1.7–7.7)
Neutrophils Relative %: 60 %
Platelet Count: 261 10*3/uL (ref 150–400)
RBC: 4.44 MIL/uL (ref 3.87–5.11)
RDW: 12.8 % (ref 11.5–15.5)
WBC Count: 11.2 10*3/uL — ABNORMAL HIGH (ref 4.0–10.5)
nRBC: 0 % (ref 0.0–0.2)

## 2019-10-18 LAB — RETICULOCYTES
Immature Retic Fract: 21.6 % — ABNORMAL HIGH (ref 2.3–15.9)
RBC.: 4.44 MIL/uL (ref 3.87–5.11)
Retic Count, Absolute: 114.6 10*3/uL (ref 19.0–186.0)
Retic Ct Pct: 2.6 % (ref 0.4–3.1)

## 2019-10-18 MED ORDER — VITAMIN D (ERGOCALCIFEROL) 1.25 MG (50000 UNIT) PO CAPS: 50000.0000 [IU] | ORAL_CAPSULE | ORAL | 1 refills | Status: DC

## 2019-10-18 NOTE — Telephone Encounter (Signed)
Patient came into office after her cancer treatment appt . They informed her she should be taking a higher dose of the vitamin D. Please call to advise , she wasn't aware of this or a script was never sent in .

## 2019-10-18 NOTE — Telephone Encounter (Signed)
Appointments scheduled calendar printed   & mailed per 7/14 los

## 2019-10-18 NOTE — Telephone Encounter (Signed)
Spoke with patient. Pt states she wasn't aware she was supposed to be taking Vit D. I advised pt that the results and recommendations were viewed on Mychart. I refilled Vit D per pt request.

## 2019-10-18 NOTE — Progress Notes (Signed)
Hematology and Oncology Follow Up Visit  Cheryl Mullen [id number] 07/07/1990 29 y.o. 10/18/2019   Principle Diagnosis:  Iron deficiency anemia secondary to menorrhagia  Current Therapy:        IV iron as indicated    Interim History:  Ms. Cheryl Mullen is here today for follow-up. She is feeling fatigued.  She recently had a low vitamin D level and is currently taking a Woman's one a day vitamin daily. Her PCP has sent in a weekly supplement.  She states that she has not had a cycle since May.  No blood loss noted. No bruising or petechiae.  No fever, chills, n/v, cough, rash, dizziness, SOB, chest pain, palpitations, abdominal pain or changes in bowel or bladder habits.  No swelling, tenderness, numbness or tingling in her extremities.  No falls or syncopal episodes to report.  She has maintained a good appetite and is staying well hydrated. Her weight is stable.   ECOG Performance Status: 0 - Asymptomatic  Medications:  Allergies as of 10/18/2019      Reactions   Prednisone Other (See Comments)   Hear rate speeds up   Nickel Hives      Medication List       Accurate as of October 18, 2019 11:36 AM. If you have any questions, ask your nurse or doctor.        levocetirizine 5 MG tablet Commonly known as: Xyzal Take 1 tablet (5 mg total) by mouth every evening.   megestrol 40 MG tablet Commonly known as: MEGACE Take 1 tablet (40 mg total) by mouth 2 (two) times daily. Can increase to two tablets twice a day in the event of heavy bleeding   montelukast 10 MG tablet Commonly known as: SINGULAIR Take 1 tablet (10 mg total) by mouth at bedtime.   triamcinolone ointment 0.1 % Commonly known as: KENALOG Apply 1 application topically 2 (two) times daily.   Vitamin D (Ergocalciferol) 1.25 MG (50000 UNIT) Caps capsule Commonly known as: DRISDOL Take 1 capsule (50,000 Units total) by mouth every 7 (seven) days.       Allergies:  Allergies  Allergen Reactions  .  Prednisone Other (See Comments)    Hear rate speeds up  . Nickel Hives    Past Medical History, Surgical history, Social history, and Family History were reviewed and updated.  Review of Systems: All other 10 point review of systems is negative.   Physical Exam:  vitals were not taken for this visit.   Wt Readings from Last 3 Encounters:  09/14/19 247 lb (112 kg)  04/19/19 235 lb (106.6 kg)  02/06/19 229 lb (103.9 kg)    Ocular: Sclerae unicteric, pupils equal, round and reactive to light Ear-nose-throat: Oropharynx clear, dentition fair Lymphatic: No cervical or supraclavicular adenopathy Lungs no rales or rhonchi, good excursion bilaterally Heart regular rate and rhythm, no murmur appreciated Abd soft, nontender, positive bowel sounds, no liver or spleen tip palpated on exam, no fluid wave  MSK no focal spinal tenderness, no joint edema Neuro: non-focal, well-oriented, appropriate affect Breasts: Deferred   Lab Results  Component Value Date   WBC 11.2 (H) 10/18/2019   HGB 13.0 10/18/2019   HCT 38.4 10/18/2019   MCV 86.5 10/18/2019   PLT 261 10/18/2019   Lab Results  Component Value Date   FERRITIN 178.6 09/14/2019   IRON 58 09/14/2019   TIBC 293 04/19/2019   UIBC 235 04/19/2019   IRONPCTSAT 20.7 09/14/2019   Lab Results  Component Value Date  RETICCTPCT 2.6 10/18/2019   RBC 4.44 10/18/2019   RBC 4.44 10/18/2019   No results found for: KPAFRELGTCHN, LAMBDASER, KAPLAMBRATIO No results found for: IGGSERUM, IGA, IGMSERUM No results found for: Marda Stalker, SPEI   Chemistry      Component Value Date/Time   NA 138 09/14/2019 1525   NA 142 06/18/2017 1140   K 4.0 09/14/2019 1525   CL 103 09/14/2019 1525   CO2 29 09/14/2019 1525   BUN 11 09/14/2019 1525   BUN 22 (H) 06/18/2017 1140   CREATININE 1.00 09/14/2019 1525      Component Value Date/Time   CALCIUM 9.4 09/14/2019 1525   ALKPHOS 78 09/14/2019  1525   AST 17 09/14/2019 1525   ALT 19 09/14/2019 1525   BILITOT 0.3 09/14/2019 1525       Impression and Plan: Ms. Cheryl Mullen is a very pleasant 29 yo African American female with iron deficiency anemia secondary to heavy cycles.  Iron studies are pending. We will replace if needed.  We will plan to see her back in another 6 months.  She will contact our office with any questions or concerns. We can certainly see her sooner if needed.   Emeline Gins, NP 7/14/202111:36 AM

## 2019-10-19 ENCOUNTER — Encounter: Payer: Self-pay | Admitting: Family Medicine

## 2019-10-19 LAB — IRON AND TIBC
Iron: 72 ug/dL (ref 41–142)
Saturation Ratios: 27 % (ref 21–57)
TIBC: 265 ug/dL (ref 236–444)
UIBC: 193 ug/dL (ref 120–384)

## 2019-10-19 LAB — FERRITIN: Ferritin: 194 ng/mL (ref 11–307)

## 2019-10-20 ENCOUNTER — Other Ambulatory Visit: Payer: Self-pay | Admitting: Family Medicine

## 2019-10-20 ENCOUNTER — Telehealth: Payer: Self-pay | Admitting: Family Medicine

## 2019-10-20 DIAGNOSIS — L309 Dermatitis, unspecified: Secondary | ICD-10-CM

## 2019-10-20 DIAGNOSIS — R21 Rash and other nonspecific skin eruption: Secondary | ICD-10-CM

## 2019-10-20 DIAGNOSIS — J302 Other seasonal allergic rhinitis: Secondary | ICD-10-CM

## 2019-10-20 MED ORDER — TRIAMCINOLONE ACETONIDE 0.1 % EX OINT: 1.0000 "application " | TOPICAL_OINTMENT | Freq: Two times a day (BID) | CUTANEOUS | 3 refills | Status: DC

## 2019-10-20 MED ORDER — LEVOCETIRIZINE DIHYDROCHLORIDE 5 MG PO TABS: 5.0000 mg | ORAL_TABLET | Freq: Every evening | ORAL | 5 refills | Status: DC

## 2019-10-20 MED ORDER — VITAMIN D (ERGOCALCIFEROL) 1.25 MG (50000 UNIT) PO CAPS: 50000.0000 [IU] | ORAL_CAPSULE | ORAL | 1 refills | Status: DC

## 2019-10-20 MED ORDER — MONTELUKAST SODIUM 10 MG PO TABS: 10.0000 mg | ORAL_TABLET | Freq: Every day | ORAL | 1 refills | Status: DC

## 2019-10-20 NOTE — Telephone Encounter (Signed)
Please advise. The last time you refilled this was in June 2020.

## 2019-10-20 NOTE — Telephone Encounter (Signed)
Patient came into office regarding a refill on all persciptions . Please advise .

## 2019-10-20 NOTE — Telephone Encounter (Signed)
Refills sent to pharmacy. 

## 2019-10-23 ENCOUNTER — Encounter: Payer: Self-pay | Admitting: Family

## 2019-10-24 ENCOUNTER — Ambulatory Visit (INDEPENDENT_AMBULATORY_CARE_PROVIDER_SITE_OTHER): Payer: Medicaid Other | Admitting: Family Medicine

## 2019-10-24 ENCOUNTER — Other Ambulatory Visit: Payer: Self-pay

## 2019-10-24 ENCOUNTER — Encounter: Payer: Self-pay | Admitting: Family Medicine

## 2019-10-24 VITALS — BP 110/80 | HR 88 | Temp 98.4°F | Resp 20 | Ht 60.0 in | Wt 246.2 lb

## 2019-10-24 DIAGNOSIS — H60391 Other infective otitis externa, right ear: Secondary | ICD-10-CM | POA: Diagnosis not present

## 2019-10-24 MED ORDER — AMOXICILLIN-POT CLAVULANATE 875-125 MG PO TABS: 1.0000 | ORAL_TABLET | Freq: Two times a day (BID) | ORAL | 0 refills | Status: DC

## 2019-10-24 MED ORDER — OFLOXACIN 0.3 % OT SOLN: 10.0000 [drp] | Freq: Every day | OTIC | 0 refills | Status: DC

## 2019-10-24 NOTE — Progress Notes (Signed)
Patient ID: Cheryl Mullen, female    DOB: 11-29-1990  Age: 29 y.o. MRN: [id number]    Subjective:  Subjective  HPI Cheryl Mullen presents for r ear pain and jaw x several days.  No fever, no congestion , no headache Occasionally has pain with eating   Review of Systems  Constitutional: Negative for appetite change, diaphoresis, fatigue and unexpected weight change.  HENT: Positive for ear pain. Negative for facial swelling, hearing loss, mouth sores, nosebleeds and postnasal drip.   Eyes: Negative for pain, redness and visual disturbance.  Respiratory: Negative for cough, chest tightness, shortness of breath and wheezing.   Cardiovascular: Negative for chest pain, palpitations and leg swelling.  Endocrine: Negative for cold intolerance, heat intolerance, polydipsia, polyphagia and polyuria.  Genitourinary: Negative for difficulty urinating, dysuria and frequency.  Neurological: Negative for dizziness, light-headedness, numbness and headaches.    History Past Medical History:  Diagnosis Date  . Allergy   . Anemia   . Eczema   . Gestational diabetes     She has a past surgical history that includes Tonsillectomy and adenoidectomy (Bilateral, 01/02/2013); Wisdom tooth extraction; and Cesarean section (N/A, 06/07/2017).   Her family history includes Diabetes in her maternal grandfather; Hyperlipidemia in her father and mother; Hypertension in her father, maternal grandfather, maternal grandmother, mother, paternal grandfather, and paternal grandmother.She reports that she has never smoked. She has never used smokeless tobacco. She reports current alcohol use. She reports that she does not use drugs.  Current Outpatient Medications on File Prior to Visit  Medication Sig Dispense Refill  . cholecalciferol (VITAMIN D3) 25 MCG (1000 UNIT) tablet Take 1,000 Units by mouth daily.    Marland Kitchen levocetirizine (XYZAL) 5 MG tablet Take 1 tablet (5 mg total) by mouth every evening. 30 tablet 5  .  megestrol (MEGACE) 40 MG tablet Take 1 tablet (40 mg total) by mouth 2 (two) times daily. Can increase to two tablets twice a day in the event of heavy bleeding 60 tablet 5  . montelukast (SINGULAIR) 10 MG tablet Take 1 tablet (10 mg total) by mouth at bedtime. 90 tablet 1  . triamcinolone ointment (KENALOG) 0.1 % Apply 1 application topically 2 (two) times daily. 80 g 3  . Vitamin D, Ergocalciferol, (DRISDOL) 1.25 MG (50000 UNIT) CAPS capsule Take 1 capsule (50,000 Units total) by mouth every 7 (seven) days. 12 capsule 1   No current facility-administered medications on file prior to visit.     Objective:  Objective  Physical Exam Vitals and nursing note reviewed.  Constitutional:      Appearance: She is well-developed.  HENT:     Head: Normocephalic and atraumatic.     Right Ear: Tenderness present. No middle ear effusion. There is no impacted cerumen. Tympanic membrane is not injected.     Left Ear: Tympanic membrane, ear canal and external ear normal. There is no impacted cerumen.     Ears:      Mouth/Throat:     Palate: No mass and lesions.     Pharynx: Oropharynx is clear. No pharyngeal swelling, oropharyngeal exudate or posterior oropharyngeal erythema.     Tonsils: No tonsillar exudate or tonsillar abscesses.   Eyes:     Conjunctiva/sclera: Conjunctivae normal.  Neck:     Thyroid: No thyromegaly.     Vascular: No carotid bruit or JVD.  Cardiovascular:     Rate and Rhythm: Normal rate and regular rhythm.     Heart sounds: Normal heart sounds. No murmur heard.  Pulmonary:     Effort: Pulmonary effort is normal. No respiratory distress.     Breath sounds: Normal breath sounds. No wheezing or rales.  Chest:     Chest wall: No tenderness.  Musculoskeletal:     Cervical back: Normal range of motion and neck supple.  Neurological:     Mental Status: She is alert and oriented to person, place, and time.    BP 110/80 (BP Location: Right Arm, Patient Position: Sitting,  Cuff Size: Large)   Pulse 88   Temp 98.4 F (36.9 C) (Oral)   Resp 20   Ht 5' (1.524 m)   Wt 246 lb 3.2 oz (111.7 kg)   SpO2 98%   BMI 48.08 kg/m  Wt Readings from Last 3 Encounters:  10/24/19 246 lb 3.2 oz (111.7 kg)  10/18/19 246 lb (111.6 kg)  09/14/19 247 lb (112 kg)     Lab Results  Component Value Date   WBC 11.2 (H) 10/18/2019   HGB 13.0 10/18/2019   HCT 38.4 10/18/2019   PLT 261 10/18/2019   GLUCOSE 100 (H) 09/14/2019   ALT 19 09/14/2019   AST 17 09/14/2019   NA 138 09/14/2019   K 4.0 09/14/2019   CL 103 09/14/2019   CREATININE 1.00 09/14/2019   BUN 11 09/14/2019   CO2 29 09/14/2019   TSH 1.14 09/14/2019   HGBA1C 5.3 12/17/2016    US PELVIS TRANSVAGINAL NON-OB (TV ONLY)  Result Date: 11/01/2018 CLINICAL DATA:  Abnormal uterine bleeding, bleeding for more than 60 days, irregular menses, G1P1 EXAM: ULTRASOUND PELVIS TRANSVAGINAL TECHNIQUE: Transvaginal ultrasound examination of the pelvis was performed including evaluation of the uterus, ovaries, adnexal regions, and pelvic cul-de-sac. COMPARISON:  None FINDINGS: Uterus Measurements: 8.6 x 4.0 x 4.5 cm = volume: 81 mL. Anteverted. Normal morphology without mass. Few nabothian cysts at cervix. Endometrium Thickness: 4.0 mm.  No endometrial fluid or focal abnormality Right ovary Measurements: 2.2 x 2.5 x 2.3 cm = volume: 6.6 mL. Suboptimally visualized due to bowel in position. No gross evidence of mass period Left ovary Measurements: 3.0 x 1.7 x 2.9 cm = volume: 7.8 mL. Normal morphology without mass Other findings:  Trace free pelvic fluid.  No adnexal masses. IMPRESSION: No significant pelvic sonographic abnormalities. Electronically Signed   By: Ulyses Southward M.D.   On: 11/01/2018 16:26     Assessment & Plan:  Plan  I am having Cheryl Mullen "Cheryl Mullen" start on ofloxacin and amoxicillin-clavulanate. I am also having her maintain her megestrol, cholecalciferol, levocetirizine, Vitamin D (Ergocalciferol), montelukast, and  triamcinolone ointment.  Meds ordered this encounter  Medications  . ofloxacin (FLOXIN OTIC) 0.3 % OTIC solution    Sig: Place 10 drops into the right ear daily.    Dispense:  10 mL    Refill:  0  . amoxicillin-clavulanate (AUGMENTIN) 875-125 MG tablet    Sig: Take 1 tablet by mouth 2 (two) times daily.    Dispense:  20 tablet    Refill:  0    Problem List Items Addressed This Visit    None    Visit Diagnoses    Other infective acute otitis externa of right ear    -  Primary   Relevant Medications   ofloxacin (FLOXIN OTIC) 0.3 % OTIC solution   amoxicillin-clavulanate (AUGMENTIN) 875-125 MG tablet    call or rto if no improvement -- or refer to ent  Follow-up: Return if symptoms worsen or fail to improve.  Donato Schultz, DO    +

## 2019-10-24 NOTE — Patient Instructions (Addendum)
Otitis Externa  Otitis externa is an infection of the outer ear canal. The outer ear canal is the area between the outside of the ear and the eardrum. Otitis externa is sometimes called swimmer's ear. What are the causes? Common causes of this condition include:  Swimming in dirty water.  Moisture in the ear.  An injury to the inside of the ear.  An object stuck in the ear.  A cut or scrape on the outside of the ear. What increases the risk? You are more likely to develop this condition if you go swimming often. What are the signs or symptoms? The first symptom of this condition is often itching in the ear. Later symptoms of the condition include:  Swelling of the ear.  Redness in the ear.  Ear pain. The pain may get worse when you pull on your ear.  Pus coming from the ear. How is this diagnosed? This condition may be diagnosed by examining the ear and testing fluid from the ear for bacteria and funguses. How is this treated? This condition may be treated with:  Antibiotic ear drops. These are often given for 10-14 days.  Medicines to reduce itching and swelling. Follow these instructions at home:  If you were prescribed antibiotic ear drops, use them as told by your health care provider. Do not stop using the antibiotic even if your condition improves.  Take over-the-counter and prescription medicines only as told by your health care provider.  Avoid getting water in your ears as told by your health care provider. This may include avoiding swimming or water sports for a few days.  Keep all follow-up visits as told by your health care provider. This is important. How is this prevented?  Keep your ears dry. Use the corner of a towel to dry your ears after you swim or bathe.  Avoid scratching or putting things in your ear. Doing these things can damage the ear canal or remove the protective wax that lines it, which makes it easier for bacteria and funguses to  grow.  Avoid swimming in lakes, polluted water, or pools that may not have enough chlorine. Contact a health care provider if:  You have a fever.  Your ear is still red, swollen, painful, or draining pus after 3 days.  Your redness, swelling, or pain gets worse.  You have a severe headache.  You have redness, swelling, pain, or tenderness in the area behind your ear. Summary  Otitis externa is an infection of the outer ear canal.  Common causes include swimming in dirty water, moisture in the ear, or a cut or scrape in the ear.  Symptoms include pain, redness, and swelling of the ear.  If you were prescribed antibiotic ear drops, use them as told by your health care provider. Do not stop using the antibiotic even if your condition improves. This information is not intended to replace advice given to you by your health care provider. Make sure you discuss any questions you have with your health care provider. Document Revised: 08/27/2017 Document Reviewed: 08/27/2017 Elsevier Patient Education  2020 Elsevier Inc.                              Carbohydrate Counting for Diabetes Mellitus, Adult  Carbohydrate counting is a method of keeping track of how many carbohydrates you eat. Eating carbohydrates naturally increases the amount of sugar (glucose) in the blood. Counting how many carbohydrates you  eat helps keep your blood glucose within normal limits, which helps you manage your diabetes (diabetes mellitus). It is important to know how many carbohydrates you can safely have in each meal. This is different for every person. A diet and nutrition specialist (registered dietitian) can help you make a meal plan and calculate how many carbohydrates you should have at each meal and snack. Carbohydrates are found in the following foods:  Grains, such as breads and cereals.  Dried beans and soy products.  Starchy vegetables, such as potatoes, peas, and  corn.  Fruit and fruit juices.  Milk and yogurt.  Sweets and snack foods, such as cake, cookies, candy, chips, and soft drinks. How do I count carbohydrates? There are two ways to count carbohydrates in food. You can use either of the methods or a combination of both. Reading "Nutrition Facts" on packaged food The "Nutrition Facts" list is included on the labels of almost all packaged foods and beverages in the [place] It includes:  The serving size.  Information about nutrients in each serving, including the grams (g) of carbohydrate per serving. To use the "Nutrition Facts":  Decide how many servings you will have.  Multiply the number of servings by the number of carbohydrates per serving.  The resulting number is the total amount of carbohydrates that you will be having. Learning standard serving sizes of other foods When you eat carbohydrate foods that are not packaged or do not include "Nutrition Facts" on the label, you need to measure the servings in order to count the amount of carbohydrates:  Measure the foods that you will eat with a food scale or measuring cup, if needed.  Decide how many standard-size servings you will eat.  Multiply the number of servings by 15. Most carbohydrate-rich foods have about 15 g of carbohydrates per serving. ? For example, if you eat 8 oz (170 g) of strawberries, you will have eaten 2 servings and 30 g of carbohydrates (2 servings x 15 g = 30 g).  For foods that have more than one food mixed, such as soups and casseroles, you must count the carbohydrates in each food that is included. The following list contains standard serving sizes of common carbohydrate-rich foods. Each of these servings has about 15 g of carbohydrates:   hamburger bun or  English muffin.   oz (15 mL) syrup.   oz (14 g) jelly.  1 slice of bread.  1 six-inch tortilla.  3 oz (85 g) cooked rice or pasta.  4 oz (113 g) cooked dried beans.  4 oz (113 g)  starchy vegetable, such as peas, corn, or potatoes.  4 oz (113 g) hot cereal.  4 oz (113 g) mashed potatoes or  of a large baked potato.  4 oz (113 g) canned or frozen fruit.  4 oz (120 mL) fruit juice.  4-6 crackers.  6 chicken nuggets.  6 oz (170 g) unsweetened dry cereal.  6 oz (170 g) plain fat-free yogurt or yogurt sweetened with artificial sweeteners.  8 oz (240 mL) milk.  8 oz (170 g) fresh fruit or one small piece of fruit.  24 oz (680 g) popped popcorn. Example of carbohydrate counting Sample meal  3 oz (85 g) chicken breast.  6 oz (170 g) brown rice.  4 oz (113 g) corn.  8 oz (240 mL) milk.  8 oz (170 g) strawberries with sugar-free whipped topping. Carbohydrate calculation 1. Identify the foods that contain carbohydrates: ? Rice. ? Corn. ?  Milk. ? Strawberries. 2. Calculate how many servings you have of each food: ? 2 servings rice. ? 1 serving corn. ? 1 serving milk. ? 1 serving strawberries. 3. Multiply each number of servings by 15 g: ? 2 servings rice x 15 g = 30 g. ? 1 serving corn x 15 g = 15 g. ? 1 serving milk x 15 g = 15 g. ? 1 serving strawberries x 15 g = 15 g. 4. Add together all of the amounts to find the total grams of carbohydrates eaten: ? 30 g + 15 g + 15 g + 15 g = 75 g of carbohydrates total. Summary  Carbohydrate counting is a method of keeping track of how many carbohydrates you eat.  Eating carbohydrates naturally increases the amount of sugar (glucose) in the blood.  Counting how many carbohydrates you eat helps keep your blood glucose within normal limits, which helps you manage your diabetes.  A diet and nutrition specialist (registered dietitian) can help you make a meal plan and calculate how many carbohydrates you should have at each meal and snack. This information is not intended to replace advice given to you by your health care provider. Make sure you discuss any questions you have with your health care  provider. Document Revised: 10/15/2016 Document Reviewed: 09/04/2015 Elsevier Patient Education  2020 [employer].

## 2019-12-13 ENCOUNTER — Encounter: Payer: Self-pay | Admitting: Family Medicine

## 2019-12-13 DIAGNOSIS — N939 Abnormal uterine and vaginal bleeding, unspecified: Secondary | ICD-10-CM

## 2019-12-15 MED ORDER — MEGESTROL ACETATE 40 MG PO TABS: 40.0000 mg | ORAL_TABLET | Freq: Two times a day (BID) | ORAL | 5 refills | Status: DC

## 2020-01-22 ENCOUNTER — Institutional Professional Consult (permissible substitution): Payer: Medicaid Other | Admitting: Obstetrics & Gynecology

## 2020-01-24 ENCOUNTER — Other Ambulatory Visit (HOSPITAL_COMMUNITY)
Admission: RE | Admit: 2020-01-24 | Discharge: 2020-01-24 | Disposition: A | Discharge status: Home / Self Care | Payer: Medicaid Other | Source: Ambulatory Visit | Attending: Obstetrics & Gynecology | Admitting: Obstetrics & Gynecology

## 2020-01-24 ENCOUNTER — Other Ambulatory Visit: Payer: Self-pay

## 2020-01-24 ENCOUNTER — Ambulatory Visit (INDEPENDENT_AMBULATORY_CARE_PROVIDER_SITE_OTHER): Payer: 59 | Admitting: Obstetrics & Gynecology

## 2020-01-24 ENCOUNTER — Encounter: Payer: Self-pay | Admitting: Obstetrics & Gynecology

## 2020-01-24 VITALS — Wt 245.0 lb

## 2020-01-24 DIAGNOSIS — Z30014 Encounter for initial prescription of intrauterine contraceptive device: Secondary | ICD-10-CM

## 2020-01-24 DIAGNOSIS — Z01419 Encounter for gynecological examination (general) (routine) without abnormal findings: Secondary | ICD-10-CM

## 2020-01-24 DIAGNOSIS — N939 Abnormal uterine and vaginal bleeding, unspecified: Secondary | ICD-10-CM

## 2020-01-24 LAB — POCT URINE PREGNANCY: Preg Test, Ur: NEGATIVE

## 2020-01-24 MED ORDER — LEVONORGESTREL 19.5 MCG/DAY IU IUD
INTRAUTERINE_SYSTEM | Freq: Once | INTRAUTERINE | Status: AC
  Administered 2020-01-24: 1 via INTRAUTERINE

## 2020-01-24 NOTE — Patient Instructions (Signed)
Intrauterine Device Insertion, Care After  This sheet gives you information about how to care for yourself after your procedure. Your health care provider may also give you more specific instructions. If you have problems or questions, contact your health care provider. What can I expect after the procedure? After the procedure, it is common to have:  Cramps and pain in the abdomen.  Light bleeding (spotting) or heavier bleeding that is like your menstrual period. This may last for up to a few days.  Lower back pain.  Dizziness.  Headaches.  Nausea. Follow these instructions at home:  Before resuming sexual activity, check to make sure that you can feel the IUD string(s). You should be able to feel the end of the string(s) below the opening of your cervix. If your IUD string is in place, you may resume sexual activity. ? If you had a hormonal IUD inserted more than 7 days after your most recent period started, you will need to use a backup method of birth control for 7 days after IUD insertion. Ask your health care provider whether this applies to you.  Continue to check that the IUD is still in place by feeling for the string(s) after every menstrual period, or once a month.  Take over-the-counter and prescription medicines only as told by your health care provider.  Do not drive or use heavy machinery while taking prescription pain medicine.  Keep all follow-up visits as told by your health care provider. This is important. Contact a health care provider if:  You have bleeding that is heavier or lasts longer than a normal menstrual cycle.  You have a fever.  You have cramps or abdominal pain that get worse or do not get better with medicine.  You develop abdominal pain that is new or is not in the same area of earlier cramping and pain.  You feel lightheaded or weak.  You have abnormal or bad-smelling discharge from your vagina.  You have pain during sexual  activity.  You have any of the following problems with your IUD string(s): ? The string bothers or hurts you or your sexual partner. ? You cannot feel the string. ? The string has gotten longer.  You can feel the IUD in your vagina.  You think you may be pregnant, or you miss your menstrual period.  You think you may have an STI (sexually transmitted infection). Get help right away if:  You have flu-like symptoms.  You have a fever and chills.  You can feel that your IUD has slipped out of place. Summary  After the procedure, it is common to have cramps and pain in the abdomen. It is also common to have light bleeding (spotting) or heavier bleeding that is like your menstrual period.  Continue to check that the IUD is still in place by feeling for the string(s) after every menstrual period, or once a month.  Keep all follow-up visits as told by your health care provider. This is important.  Contact your health care provider if you have problems with your IUD string(s), such as the string getting longer or bothering you or your sexual partner. This information is not intended to replace advice given to you by your health care provider. Make sure you discuss any questions you have with your health care provider. Document Revised: 03/05/2017 Document Reviewed: 02/12/2016 Elsevier Patient Education  2020 Elsevier Inc. Levonorgestrel intrauterine device (IUD) What is this medicine? LEVONORGESTREL IUD (LEE voe nor jes trel) is a   contraceptive (birth control) device. The device is placed inside the uterus by a healthcare professional. It is used to prevent pregnancy. This device can also be used to treat heavy bleeding that occurs during your period. This medicine may be used for other purposes; ask your health care provider or pharmacist if you have questions. COMMON BRAND NAME(S): Kyleena, LILETTA, Mirena, Skyla What should I tell my health care provider before I take this  medicine? They need to know if you have any of these conditions:  abnormal Pap smear  cancer of the breast, uterus, or cervix  diabetes  endometritis  genital or pelvic infection now or in the past  have more than one sexual partner or your partner has more than one partner  heart disease  history of an ectopic or tubal pregnancy  immune system problems  IUD in place  liver disease or tumor  problems with blood clots or take blood-thinners  seizures  use intravenous drugs  uterus of unusual shape  vaginal bleeding that has not been explained  an unusual or allergic reaction to levonorgestrel, other hormones, silicone, or polyethylene, medicines, foods, dyes, or preservatives  pregnant or trying to get pregnant  breast-feeding How should I use this medicine? This device is placed inside the uterus by a health care professional. Talk to your pediatrician regarding the use of this medicine in children. Special care may be needed. Overdosage: If you think you have taken too much of this medicine contact a poison control center or emergency room at once. NOTE: This medicine is only for you. Do not share this medicine with others. What if I miss a dose? This does not apply. Depending on the brand of device you have inserted, the device will need to be replaced every 3 to 6 years if you wish to continue using this type of birth control. What may interact with this medicine? Do not take this medicine with any of the following medications:  amprenavir  bosentan  fosamprenavir This medicine may also interact with the following medications:  aprepitant  armodafinil  barbiturate medicines for inducing sleep or treating seizures  bexarotene  boceprevir  griseofulvin  medicines to treat seizures like carbamazepine, ethotoin, felbamate, oxcarbazepine, phenytoin, topiramate  modafinil  pioglitazone  rifabutin  rifampin  rifapentine  some medicines to  treat HIV infection like atazanavir, efavirenz, indinavir, lopinavir, nelfinavir, tipranavir, ritonavir  St. John's wort  warfarin This list may not describe all possible interactions. Give your health care provider a list of all the medicines, herbs, non-prescription drugs, or dietary supplements you use. Also tell them if you smoke, drink alcohol, or use illegal drugs. Some items may interact with your medicine. What should I watch for while using this medicine? Visit your doctor or health care professional for regular check ups. See your doctor if you or your partner has sexual contact with others, becomes HIV positive, or gets a sexual transmitted disease. This product does not protect you against HIV infection (AIDS) or other sexually transmitted diseases. You can check the placement of the IUD yourself by reaching up to the top of your vagina with clean fingers to feel the threads. Do not pull on the threads. It is a good habit to check placement after each menstrual period. Call your doctor right away if you feel more of the IUD than just the threads or if you cannot feel the threads at all. The IUD may come out by itself. You may become pregnant if the device comes   out. If you notice that the IUD has come out use a backup birth control method like condoms and call your health care provider. Using tampons will not change the position of the IUD and are okay to use during your period. This IUD can be safely scanned with magnetic resonance imaging (MRI) only under specific conditions. Before you have an MRI, tell your healthcare provider that you have an IUD in place, and which type of IUD you have in place. What side effects may I notice from receiving this medicine? Side effects that you should report to your doctor or health care professional as soon as possible:  allergic reactions like skin rash, itching or hives, swelling of the face, lips, or tongue  fever, flu-like symptoms  genital  sores  high blood pressure  no menstrual period for 6 weeks during use  pain, swelling, warmth in the leg  pelvic pain or tenderness  severe or sudden headache  signs of pregnancy  stomach cramping  sudden shortness of breath  trouble with balance, talking, or walking  unusual vaginal bleeding, discharge  yellowing of the eyes or skin Side effects that usually do not require medical attention (report to your doctor or health care professional if they continue or are bothersome):  acne  breast pain  change in sex drive or performance  changes in weight  cramping, dizziness, or faintness while the device is being inserted  headache  irregular menstrual bleeding within first 3 to 6 months of use  nausea This list may not describe all possible side effects. Call your doctor for medical advice about side effects. You may report side effects to FDA at 1-800-FDA-1088. Where should I keep my medicine? This does not apply. NOTE: This sheet is a summary. It may not cover all possible information. If you have questions about this medicine, talk to your doctor, pharmacist, or health care provider.  2020 Elsevier/Gold Standard (2018-02-01 13:22:01)  

## 2020-01-24 NOTE — Progress Notes (Signed)
Subjective:     Cheryl Mullen is a 29 y.o. female here for a routine exam.  Current complaints: Pt requests endometrial ablation but, she also says that her partner wants more children and she is undecided.     Gynecologic History No LMP recorded. Contraception: none Last Pap: 12/17/2016. Results were: abnormal LGSIL with trich Last mammogram: n/a  Obstetric History OB History  Gravida Para Term Preterm AB Living  1 1 1     1   SAB TAB Ectopic Multiple Live Births        0 1    # Outcome Date GA Lbr Len/2nd Weight Sex Delivery Anes PTL Lv  1 Term 06/07/17 [redacted]w[redacted]d 39:35 / 03:52 6 lb 2.4 oz (2.79 kg) F CS-LTranv EPI  LIV   The following portions of the patient's history were reviewed and updated as appropriate: allergies, current medications, past family history, past medical history, past social history, past surgical history and problem list.  Review of Systems Pertinent items are noted in HPI.    Objective:  Wt 245 lb (111.1 kg)    Breastfeeding No    BMI 47.85 kg/m  General Appearance:    Alert, cooperative, no distress, appears stated age  Head:    Normocephalic, without obvious abnormality, atraumatic  Eyes:    conjunctiva/corneas clear, EOM's intact, both eyes  Ears:    Normal external ear canals, both ears  Nose:   Nares normal, septum midline, mucosa normal, no drainage    or sinus tenderness  Throat:   Lips, mucosa, and tongue normal; teeth and gums normal  Neck:   Supple, symmetrical, trachea midline, no adenopathy;    thyroid:  no enlargement/tenderness/nodules  Back:     Symmetric, no curvature, ROM normal, no CVA tenderness  Lungs:     respirations unlabored  Chest Wall:    No tenderness or deformity   Heart:    Regular rate and rhythm  Breast Exam:    No tenderness, masses, or nipple abnormality  Abdomen:     Soft, non-tender, bowel sounds active all four quadrants,    no masses, no organomegaly  Genitalia:    Normal female without lesion, discharge or tenderness      Extremities:   Extremities normal, atraumatic, no cyanosis or edema  Pulses:   2+ and symmetric all extremities  Skin:   Skin color, texture, turgor normal, no rashes or lesions   GYNECOLOGY CLINIC PROCEDURE NOTE  IUD Insertion Procedure Note Patient identified, informed consent performed.  Discussed risks of irregular bleeding, cramping, infection, malpositioning or misplacement of the IUD outside the uterus which may require further procedures. Time out was performed.  Urine pregnancy test negative.  Speculum placed in the vagina.  Cervix visualized.  Cleaned with Betadine x 2.  Grasped anteriorly with a single tooth tenaculum.  Uterus sounded to 8 cm.  Liletta IUD placed per manufacturer's recommendations.  Strings trimmed to 3 cm. Tenaculum was removed, good hemostasis noted.  Patient tolerated procedure well.   Assessment:    Healthy female exam.   AUB- pt requested endometrial ablation. She and her partner are undecided about future fertility. Reviewed options. Pt opts for LnIUD.      Plan:  Diagnoses and all orders for this visit:  Well female exam with routine gynecological exam -     Cytology - PAP( )  Abnormal uterine bleeding (AUB) -     POCT urine pregnancy  Other orders -     levonorgestrel (LILETTA) 19.5  MCG/DAY IUD  Patient was given post-procedure instructions.  Patient was asked to follow up in 4 weeks for IUD check. F/u sooner prn.  Ashely Goosby L. Harraway-Smith, M.D., [facility]

## 2020-01-24 NOTE — Progress Notes (Signed)
Patient is still having two periods a month - take megace for her bleeding when it occurs. Patient is having heavy pads- soaking super tampon and large maxi pad in just 1-2 hours. [name] RN

## 2020-01-25 ENCOUNTER — Encounter: Payer: Self-pay | Admitting: Obstetrics & Gynecology

## 2020-01-26 LAB — CYTOLOGY - PAP: Diagnosis: NEGATIVE

## 2020-02-21 ENCOUNTER — Encounter: Payer: Self-pay | Admitting: Family Medicine

## 2020-02-21 ENCOUNTER — Ambulatory Visit (INDEPENDENT_AMBULATORY_CARE_PROVIDER_SITE_OTHER): Payer: Medicaid Other | Admitting: Family Medicine

## 2020-02-21 ENCOUNTER — Other Ambulatory Visit: Payer: Self-pay

## 2020-02-21 VITALS — BP 128/82 | HR 94 | Wt 247.0 lb

## 2020-02-21 DIAGNOSIS — Z30431 Encounter for routine checking of intrauterine contraceptive device: Secondary | ICD-10-CM | POA: Diagnosis not present

## 2020-02-21 DIAGNOSIS — N939 Abnormal uterine and vaginal bleeding, unspecified: Secondary | ICD-10-CM

## 2020-02-21 NOTE — Progress Notes (Signed)
   Subjective:   Patient Name: Cheryl Mullen, female   DOB: June 12, 1990, 29 y.o.  MRN: [id number]  HPI Patient here for an IUD check.  She had the Liletta IUD placed 1 month ago for AUB.  She reports cramping twice a day since insertion.   Review of Systems  Constitutional: Negative for fever and chills.  Gastrointestinal: Negative for abdominal pain.  Genitourinary: Negative for vaginal discharge, vaginal pain, pelvic pain and dyspareunia.        Objective:   Physical Exam  Constitutional: She appears well-developed and well-nourished.  HENT:  Head: Normocephalic and atraumatic.  Abdominal: Soft. There is no tenderness. There is no guarding.  Genitourinary: There is no rash, tenderness or lesion on the right labia. There is no rash, tenderness or lesion on the left labia. No erythema or tenderness in the vagina. No foreign body around the vagina. No signs of injury around the vagina. No vaginal discharge found.    Skin: Skin is warm and dry.  Psychiatric: She has a normal mood and affect. Her behavior is normal. Judgment and thought content normal.       Assessment & Plan:  1. IUD check up 2. AUB IUD in place. Ibuprofen 600mg  every 6 hours for the next 5 days or so for cramping. Stop megace. Patient to expect bleeding after stopping megace, then should go away. Pt to call with any other problems.  Recheck in 3 months

## 2020-02-21 NOTE — Progress Notes (Signed)
Patient presents for I IUD string check. [name] RN

## 2020-04-19 ENCOUNTER — Inpatient Hospital Stay: Payer: 59 | Attending: Family

## 2020-04-19 ENCOUNTER — Inpatient Hospital Stay (HOSPITAL_BASED_OUTPATIENT_CLINIC_OR_DEPARTMENT_OTHER): Payer: 59 | Admitting: Family

## 2020-04-19 ENCOUNTER — Encounter: Payer: Self-pay | Admitting: Family

## 2020-04-19 ENCOUNTER — Telehealth: Payer: Self-pay

## 2020-04-19 ENCOUNTER — Other Ambulatory Visit: Payer: Self-pay

## 2020-04-19 VITALS — BP 131/78 | HR 92 | Resp 19 | Wt 249.1 lb

## 2020-04-19 DIAGNOSIS — D500 Iron deficiency anemia secondary to blood loss (chronic): Secondary | ICD-10-CM | POA: Diagnosis present

## 2020-04-19 DIAGNOSIS — D573 Sickle-cell trait: Secondary | ICD-10-CM | POA: Diagnosis not present

## 2020-04-19 DIAGNOSIS — N921 Excessive and frequent menstruation with irregular cycle: Secondary | ICD-10-CM | POA: Diagnosis not present

## 2020-04-19 DIAGNOSIS — Z79899 Other long term (current) drug therapy: Secondary | ICD-10-CM | POA: Insufficient documentation

## 2020-04-19 DIAGNOSIS — N920 Excessive and frequent menstruation with regular cycle: Secondary | ICD-10-CM | POA: Insufficient documentation

## 2020-04-19 LAB — CBC WITH DIFFERENTIAL (CANCER CENTER ONLY)
Abs Immature Granulocytes: 0.09 10*3/uL — ABNORMAL HIGH (ref 0.00–0.07)
Basophils Absolute: 0 10*3/uL (ref 0.0–0.1)
Basophils Relative: 0 %
Eosinophils Absolute: 0.2 10*3/uL (ref 0.0–0.5)
Eosinophils Relative: 2 %
HCT: 38.4 % (ref 36.0–46.0)
Hemoglobin: 13.2 g/dL (ref 12.0–15.0)
Immature Granulocytes: 1 %
Lymphocytes Relative: 40 %
Lymphs Abs: 3.1 10*3/uL (ref 0.7–4.0)
MCH: 29 pg (ref 26.0–34.0)
MCHC: 34.4 g/dL (ref 30.0–36.0)
MCV: 84.4 fL (ref 80.0–100.0)
Monocytes Absolute: 0.5 10*3/uL (ref 0.1–1.0)
Monocytes Relative: 7 %
Neutro Abs: 3.8 10*3/uL (ref 1.7–7.7)
Neutrophils Relative %: 50 %
Platelet Count: 274 10*3/uL (ref 150–400)
RBC: 4.55 MIL/uL (ref 3.87–5.11)
RDW: 13 % (ref 11.5–15.5)
WBC Count: 7.6 10*3/uL (ref 4.0–10.5)
nRBC: 0 % (ref 0.0–0.2)

## 2020-04-19 LAB — RETICULOCYTES
Immature Retic Fract: 14.2 % (ref 2.3–15.9)
RBC.: 4.49 MIL/uL (ref 3.87–5.11)
Retic Count, Absolute: 100.1 10*3/uL (ref 19.0–186.0)
Retic Ct Pct: 2.2 % (ref 0.4–3.1)

## 2020-04-19 LAB — FERRITIN: Ferritin: 145 ng/mL (ref 11–307)

## 2020-04-19 LAB — IRON AND TIBC
Iron: 61 ug/dL (ref 28–170)
Saturation Ratios: 19 % (ref 10.4–31.8)
TIBC: 328 ug/dL (ref 250–450)
UIBC: 267 ug/dL

## 2020-04-19 NOTE — Telephone Encounter (Signed)
appts made per los and pt req not to print   aom

## 2020-04-19 NOTE — Progress Notes (Signed)
Hematology and Oncology Follow Up Visit  Cheryl Mullen [id number] October 30, 1990 30 y.o. 04/19/2020   Principle Diagnosis:  Iron deficiency anemia secondary to menorrhagia  Current Therapy: IV iron as indicated   Interim History:  Ms. Cheryl Mullen is here today for follow-up. She is doing well but notes occasional fatigue. She is staying busy planning a wedding, enjoying her sweet baby girl and working.  She has an IUD in place and will rarely spot. She also has Megace to take as needed for breakthrough bleeding.  No bleeding noted. No abnormal bruising, no petechiae.  No fever, chills, n/v, cough, rash, dizziness, SOB, chest pain, palpitations, abdominal pain or changes in bowel or bladder habits.  No swelling, tenderness, numbness or tingling in her extremities.  No falls or syncope to report.  She has maintained a good appetite and is staying well hydrated. Her weight is stable at 249 lbs.   ECOG Performance Status: 0 - Asymptomatic  Medications:  Allergies as of 04/19/2020      Reactions   Prednisone Other (See Comments)   Hear rate speeds up   Nickel Hives      Medication List       Accurate as of April 19, 2020  1:30 PM. If you have any questions, ask your nurse or doctor.        amoxicillin-clavulanate 875-125 MG tablet Commonly known as: Augmentin Take 1 tablet by mouth 2 (two) times daily.   cholecalciferol 25 MCG (1000 UNIT) tablet Commonly known as: VITAMIN D3 Take 1,000 Units by mouth daily.   levocetirizine 5 MG tablet Commonly known as: Xyzal Take 1 tablet (5 mg total) by mouth every evening.   megestrol 40 MG tablet Commonly known as: MEGACE Take 1 tablet (40 mg total) by mouth 2 (two) times daily. Can increase to two tablets twice a day in the event of heavy bleeding   montelukast 10 MG tablet Commonly known as: SINGULAIR Take 1 tablet (10 mg total) by mouth at bedtime.   ofloxacin 0.3 % OTIC solution Commonly known as: Floxin Otic Place  10 drops into the right ear daily.   triamcinolone ointment 0.1 % Commonly known as: KENALOG Apply 1 application topically 2 (two) times daily.   Vitamin D (Ergocalciferol) 1.25 MG (50000 UNIT) Caps capsule Commonly known as: DRISDOL Take 1 capsule (50,000 Units total) by mouth every 7 (seven) days.       Allergies:  Allergies  Allergen Reactions  . Prednisone Other (See Comments)    Hear rate speeds up  . Nickel Hives    Past Medical History, Surgical history, Social history, and Family History were reviewed and updated.  Review of Systems: All other 10 point review of systems is negative.   Physical Exam:  vitals were not taken for this visit.   Wt Readings from Last 3 Encounters:  02/21/20 247 lb (112 kg)  01/24/20 245 lb (111.1 kg)  10/24/19 246 lb 3.2 oz (111.7 kg)    Ocular: Sclerae unicteric, pupils equal, round and reactive to light Ear-nose-throat: Oropharynx clear, dentition fair Lymphatic: No cervical or supraclavicular adenopathy Lungs no rales or rhonchi, good excursion bilaterally Heart regular rate and rhythm, no murmur appreciated Abd soft, nontender, positive bowel sounds MSK no focal spinal tenderness, no joint edema Neuro: non-focal, well-oriented, appropriate affect Breasts: Deferred   Lab Results  Component Value Date   WBC 11.2 (H) 10/18/2019   HGB 13.0 10/18/2019   HCT 38.4 10/18/2019   MCV 86.5 10/18/2019   PLT  261 10/18/2019   Lab Results  Component Value Date   FERRITIN 194 10/18/2019   IRON 72 10/18/2019   TIBC 265 10/18/2019   UIBC 193 10/18/2019   IRONPCTSAT 27 10/18/2019   Lab Results  Component Value Date   RETICCTPCT 2.6 10/18/2019   RBC 4.44 10/18/2019   RBC 4.44 10/18/2019   No results found for: KPAFRELGTCHN, LAMBDASER, KAPLAMBRATIO No results found for: IGGSERUM, IGA, IGMSERUM No results found for: Marda Stalker, SPEI   Chemistry      Component Value  Date/Time   NA 138 09/14/2019 1525   NA 142 06/18/2017 1140   K 4.0 09/14/2019 1525   CL 103 09/14/2019 1525   CO2 29 09/14/2019 1525   BUN 11 09/14/2019 1525   BUN 22 (H) 06/18/2017 1140   CREATININE 1.00 09/14/2019 1525      Component Value Date/Time   CALCIUM 9.4 09/14/2019 1525   ALKPHOS 78 09/14/2019 1525   AST 17 09/14/2019 1525   ALT 19 09/14/2019 1525   BILITOT 0.3 09/14/2019 1525       Impression and Plan: Ms. Cheryl Mullen is a very pleasant 30yo African American female with iron deficiency anemia secondary to heavy cycles.  Iron studies are pending. We will replace if needed.  Follow-up in 6 months.  She can contact our office with any questions or concerns.   Emeline Gins, NP 1/14/20221:30 PM

## 2020-04-24 ENCOUNTER — Encounter: Payer: Self-pay | Admitting: Family

## 2020-04-24 ENCOUNTER — Other Ambulatory Visit: Payer: Self-pay | Admitting: Family

## 2020-04-24 DIAGNOSIS — D573 Sickle-cell trait: Secondary | ICD-10-CM

## 2020-04-24 MED ORDER — FOLIC ACID 1 MG PO TABS: 1.0000 mg | ORAL_TABLET | Freq: Every day | ORAL | 11 refills | Status: DC

## 2020-07-10 ENCOUNTER — Encounter: Payer: Self-pay | Admitting: Family Medicine

## 2020-07-10 ENCOUNTER — Other Ambulatory Visit: Payer: Self-pay

## 2020-07-10 ENCOUNTER — Ambulatory Visit (INDEPENDENT_AMBULATORY_CARE_PROVIDER_SITE_OTHER): Payer: 59 | Admitting: Family Medicine

## 2020-07-10 VITALS — BP 133/89 | HR 88 | Wt 247.0 lb

## 2020-07-10 DIAGNOSIS — Z975 Presence of (intrauterine) contraceptive device: Secondary | ICD-10-CM

## 2020-07-10 DIAGNOSIS — N921 Excessive and frequent menstruation with irregular cycle: Secondary | ICD-10-CM | POA: Diagnosis not present

## 2020-07-10 DIAGNOSIS — Z30431 Encounter for routine checking of intrauterine contraceptive device: Secondary | ICD-10-CM | POA: Diagnosis not present

## 2020-07-10 MED ORDER — NORETHINDRONE ACETATE 5 MG PO TABS: 5.0000 mg | ORAL_TABLET | Freq: Every day | ORAL | 2 refills | Status: DC

## 2020-07-10 NOTE — Progress Notes (Signed)
   Subjective:   Patient Name: Cheryl Mullen, female   DOB: 05-06-1990, 30 y.o.  MRN: [id number]  HPI Patient here for an IUD check.  She had the Liletta IUD placed in Oct 2021.  She reports bleeding and cramping for a couple of days every couple of days - 2 days of bleeding, then 2 days of not bleeding. Cramping periodically.   Review of Systems  Constitutional: Negative for fever and chills.  Gastrointestinal: Negative for abdominal pain.  Genitourinary: Negative for vaginal discharge, vaginal pain, pelvic pain and dyspareunia.        Objective:   Physical Exam  Constitutional: She appears well-developed and well-nourished.  HENT:  Head: Normocephalic and atraumatic.  Abdominal: Soft. There is no tenderness. There is no guarding.  Genitourinary: There is no rash, tenderness or lesion on the right labia. There is no rash, tenderness or lesion on the left labia. No erythema or tenderness in the vagina. No foreign body around the vagina. No signs of injury around the vagina. No vaginal discharge found.    Skin: Skin is warm and dry.  Psychiatric: She has a normal mood and affect. Her behavior is normal. Judgment and thought content normal.       Assessment & Plan:  1. IUD check up IUD in placed  2. Breakthrough bleeding with IUD Start aygestin 5mg  PO daily to see if this will suppress bleeding. F/u in 2 months.

## 2020-07-10 NOTE — Progress Notes (Signed)
Patient would like IUD checked. Patient states she has still had some bleeding since insertion( sometimes passing blood clots), cramping, and states she can feel it. [name] RN

## 2020-07-31 DIAGNOSIS — R102 Pelvic and perineal pain: Secondary | ICD-10-CM

## 2020-08-01 ENCOUNTER — Other Ambulatory Visit: Payer: Self-pay

## 2020-08-01 ENCOUNTER — Ambulatory Visit (HOSPITAL_BASED_OUTPATIENT_CLINIC_OR_DEPARTMENT_OTHER)
Admission: RE | Admit: 2020-08-01 | Discharge: 2020-08-01 | Disposition: A | Discharge status: Home / Self Care | Payer: 59 | Source: Ambulatory Visit | Attending: Family Medicine | Admitting: Family Medicine

## 2020-08-01 DIAGNOSIS — R102 Pelvic and perineal pain: Secondary | ICD-10-CM | POA: Diagnosis present

## 2020-08-02 ENCOUNTER — Other Ambulatory Visit: Payer: Self-pay | Admitting: Family Medicine

## 2020-08-02 MED ORDER — OXYCODONE-ACETAMINOPHEN 5-325 MG PO TABS: 1.0000 | ORAL_TABLET | ORAL | 0 refills | Status: AC | PRN

## 2020-09-11 ENCOUNTER — Other Ambulatory Visit: Payer: Self-pay

## 2020-09-11 ENCOUNTER — Ambulatory Visit (INDEPENDENT_AMBULATORY_CARE_PROVIDER_SITE_OTHER): Payer: 59 | Admitting: Family Medicine

## 2020-09-11 ENCOUNTER — Encounter: Payer: Self-pay | Admitting: General Practice

## 2020-09-11 ENCOUNTER — Encounter: Payer: Self-pay | Admitting: Family Medicine

## 2020-09-11 VITALS — BP 114/72 | HR 88 | Ht 60.0 in | Wt 249.0 lb

## 2020-09-11 DIAGNOSIS — Z30432 Encounter for removal of intrauterine contraceptive device: Secondary | ICD-10-CM | POA: Diagnosis not present

## 2020-09-11 NOTE — Progress Notes (Signed)
Desires IUD removal due to increased cramping. She is also wanting to get pregnant  IUD Removal  Patient was in the dorsal lithotomy position, normal external genitalia was noted.  A speculum was placed in the patient's vagina, normal discharge was noted, no lesions. The multiparous cervix was visualized, no lesions, no abnormal discharge,  and was swabbed with Betadine using scopettes.  The strings of the IUD was grasped and pulled using ring forceps.  The IUD was successfully removed in its entirety.  Patient tolerated the procedure well.

## 2020-09-11 NOTE — Progress Notes (Signed)
Pt wants IUD removed- does not want any other form of birth control

## 2020-10-18 ENCOUNTER — Telehealth: Payer: Self-pay

## 2020-10-18 ENCOUNTER — Other Ambulatory Visit: Payer: 59

## 2020-10-18 ENCOUNTER — Ambulatory Visit: Payer: 59 | Admitting: Family

## 2020-11-18 ENCOUNTER — Other Ambulatory Visit: Payer: Self-pay | Admitting: Family

## 2020-11-25 ENCOUNTER — Inpatient Hospital Stay: Payer: 59 | Attending: Family

## 2020-11-25 ENCOUNTER — Inpatient Hospital Stay (HOSPITAL_BASED_OUTPATIENT_CLINIC_OR_DEPARTMENT_OTHER): Payer: 59 | Admitting: Family

## 2020-11-25 ENCOUNTER — Telehealth: Payer: Self-pay | Admitting: *Deleted

## 2020-11-25 ENCOUNTER — Other Ambulatory Visit: Payer: Self-pay

## 2020-11-25 ENCOUNTER — Encounter: Payer: Self-pay | Admitting: Family

## 2020-11-25 VITALS — BP 130/68 | HR 94 | Temp 98.3°F | Resp 17 | Wt 252.0 lb

## 2020-11-25 DIAGNOSIS — N920 Excessive and frequent menstruation with regular cycle: Secondary | ICD-10-CM | POA: Insufficient documentation

## 2020-11-25 DIAGNOSIS — D573 Sickle-cell trait: Secondary | ICD-10-CM | POA: Diagnosis not present

## 2020-11-25 DIAGNOSIS — D500 Iron deficiency anemia secondary to blood loss (chronic): Secondary | ICD-10-CM

## 2020-11-25 DIAGNOSIS — N921 Excessive and frequent menstruation with irregular cycle: Secondary | ICD-10-CM

## 2020-11-25 LAB — CBC WITH DIFFERENTIAL (CANCER CENTER ONLY)
Abs Immature Granulocytes: 0.05 10*3/uL (ref 0.00–0.07)
Basophils Absolute: 0 10*3/uL (ref 0.0–0.1)
Basophils Relative: 0 %
Eosinophils Absolute: 0.1 10*3/uL (ref 0.0–0.5)
Eosinophils Relative: 2 %
HCT: 38 % (ref 36.0–46.0)
Hemoglobin: 12.8 g/dL (ref 12.0–15.0)
Immature Granulocytes: 1 %
Lymphocytes Relative: 37 %
Lymphs Abs: 2.6 10*3/uL (ref 0.7–4.0)
MCH: 29.1 pg (ref 26.0–34.0)
MCHC: 33.7 g/dL (ref 30.0–36.0)
MCV: 86.4 fL (ref 80.0–100.0)
Monocytes Absolute: 0.5 10*3/uL (ref 0.1–1.0)
Monocytes Relative: 7 %
Neutro Abs: 3.7 10*3/uL (ref 1.7–7.7)
Neutrophils Relative %: 53 %
Platelet Count: 261 10*3/uL (ref 150–400)
RBC: 4.4 MIL/uL (ref 3.87–5.11)
RDW: 13.3 % (ref 11.5–15.5)
WBC Count: 7 10*3/uL (ref 4.0–10.5)
nRBC: 0 % (ref 0.0–0.2)

## 2020-11-25 LAB — RETICULOCYTES
Immature Retic Fract: 15.1 % (ref 2.3–15.9)
RBC.: 4.39 MIL/uL (ref 3.87–5.11)
Retic Count, Absolute: 103.2 10*3/uL (ref 19.0–186.0)
Retic Ct Pct: 2.4 % (ref 0.4–3.1)

## 2020-11-25 NOTE — Telephone Encounter (Signed)
Per 11/25/20 LOS - gave upcoming appoints - confirmed

## 2020-11-25 NOTE — Progress Notes (Signed)
Hematology and Oncology Follow Up Visit  Cheryl Mullen [id number] 1990/10/16 30 y.o. 11/25/2020   Principle Diagnosis:  Iron deficiency anemia secondary to menorrhagia    Current Therapy:        IV iron as indicated    Interim History:  Cheryl Mullen is here today for follow-up. She is doing well and just got married. The had a wonderful honeymoon in the [place] but she was happy to get home to her sweet baby girl.  She had her IUD removed in June and her cycle in July was heavy. No other blood loss noted.  No bruising or petechiae.  No fever, chills, n/v, cough, rash, dizziness, SOB, chest pain, palpitations, abdominal pain or changes in bowel or bladder habits.  No swelling, tenderness, numbness or tingling in her extremities at this time.  No falls or syncope.  She has maintained a good appetite and is staying well hydrated. Her weight is stable at 252 lbs.   ECOG Performance Status: 1 - Symptomatic but completely ambulatory  Medications:  Allergies as of 11/25/2020       Reactions   Prednisone Other (See Comments)   Hear rate speeds up   Nickel Hives        Medication List        Accurate as of November 25, 2020  2:38 PM. If you have any questions, ask your nurse or doctor.          folic acid 1 MG tablet Commonly known as: FOLVITE Take 1 tablet (1 mg total) by mouth daily.   levocetirizine 5 MG tablet Commonly known as: Xyzal Take 1 tablet (5 mg total) by mouth every evening.   montelukast 10 MG tablet Commonly known as: SINGULAIR Take 1 tablet (10 mg total) by mouth at bedtime.   norethindrone 5 MG tablet Commonly known as: AYGESTIN Take 1 tablet (5 mg total) by mouth daily.   triamcinolone ointment 0.1 % Commonly known as: KENALOG Apply 1 application topically 2 (two) times daily.   Vitamin D (Ergocalciferol) 1.25 MG (50000 UNIT) Caps capsule Commonly known as: DRISDOL Take 1 capsule (50,000 Units total) by mouth every 7 (seven) days.         Allergies:  Allergies  Allergen Reactions   Prednisone Other (See Comments)    Hear rate speeds up   Nickel Hives    Past Medical History, Surgical history, Social history, and Family History were reviewed and updated.  Review of Systems: All other 10 point review of systems is negative.   Physical Exam:  weight is 252 lb (114.3 kg). Her oral temperature is 98.3 F (36.8 C). Her blood pressure is 130/68 and her pulse is 94. Her respiration is 17 and oxygen saturation is 99%.   Wt Readings from Last 3 Encounters:  11/25/20 252 lb (114.3 kg)  09/11/20 249 lb (112.9 kg)  07/10/20 247 lb (112 kg)    Ocular: Sclerae unicteric, pupils equal, round and reactive to light Ear-nose-throat: Oropharynx clear, dentition fair Lymphatic: No cervical or supraclavicular adenopathy Lungs no rales or rhonchi, good excursion bilaterally Heart regular rate and rhythm, no murmur appreciated Abd soft, nontender, positive bowel sounds MSK no focal spinal tenderness, no joint edema Neuro: non-focal, well-oriented, appropriate affect Breasts: Deferred   Lab Results  Component Value Date   WBC 7.0 11/25/2020   HGB 12.8 11/25/2020   HCT 38.0 11/25/2020   MCV 86.4 11/25/2020   PLT 261 11/25/2020   Lab Results  Component Value Date  FERRITIN 145 04/19/2020   IRON 61 04/19/2020   TIBC 328 04/19/2020   UIBC 267 04/19/2020   IRONPCTSAT 19 04/19/2020   Lab Results  Component Value Date   RETICCTPCT 2.4 11/25/2020   RBC 4.39 11/25/2020   RBC 4.40 11/25/2020   No results found for: KPAFRELGTCHN, LAMBDASER, KAPLAMBRATIO No results found for: IGGSERUM, IGA, IGMSERUM No results found for: Cheryl Mullen, SPEI   Chemistry      Component Value Date/Time   NA 138 09/14/2019 1525   NA 142 06/18/2017 1140   K 4.0 09/14/2019 1525   CL 103 09/14/2019 1525   CO2 29 09/14/2019 1525   BUN 11 09/14/2019 1525   BUN 22 (H) 06/18/2017 1140    CREATININE 1.00 09/14/2019 1525      Component Value Date/Time   CALCIUM 9.4 09/14/2019 1525   ALKPHOS 78 09/14/2019 1525   AST 17 09/14/2019 1525   ALT 19 09/14/2019 1525   BILITOT 0.3 09/14/2019 1525       Impression and Plan: Cheryl Mullen is a very pleasant 30 yo African American female with iron deficiency anemia secondary to heavy cycles.  Iron studie are pending. We will replace if needed.  Follow-up in 6 months.  She can contact our office with any questions or concerns.   Emeline Gins, NP 8/22/20222:38 PM

## 2020-11-26 ENCOUNTER — Telehealth: Payer: Self-pay

## 2020-11-26 LAB — IRON AND TIBC
Iron: 58 ug/dL (ref 41–142)
Saturation Ratios: 20 % — ABNORMAL LOW (ref 21–57)
TIBC: 293 ug/dL (ref 236–444)
UIBC: 235 ug/dL (ref 120–384)

## 2020-11-26 LAB — FERRITIN: Ferritin: 201 ng/mL (ref 11–307)

## 2020-12-06 ENCOUNTER — Other Ambulatory Visit: Payer: Self-pay

## 2020-12-06 ENCOUNTER — Inpatient Hospital Stay: Payer: 59 | Attending: Family

## 2020-12-06 VITALS — BP 136/88 | HR 81 | Temp 98.0°F | Resp 16

## 2020-12-06 DIAGNOSIS — N920 Excessive and frequent menstruation with regular cycle: Secondary | ICD-10-CM | POA: Diagnosis not present

## 2020-12-06 DIAGNOSIS — N921 Excessive and frequent menstruation with irregular cycle: Secondary | ICD-10-CM

## 2020-12-06 DIAGNOSIS — D500 Iron deficiency anemia secondary to blood loss (chronic): Secondary | ICD-10-CM

## 2020-12-06 MED ORDER — SODIUM CHLORIDE 0.9 % IV SOLN
125.0000 mg | Freq: Once | INTRAVENOUS | Status: AC
  Administered 2020-12-06: 125 mg via INTRAVENOUS
  Filled 2020-12-06: qty 125

## 2020-12-06 MED ORDER — SODIUM CHLORIDE 0.9 % IV SOLN: INTRAVENOUS | Status: DC

## 2020-12-06 NOTE — Patient Instructions (Signed)
Sodium Ferric Gluconate Complex Injection What is this medication? SODIUM FERRIC GLUCONATE COMPLEX (SOE dee um FER ik GLOO koe nate KOM pleks) treats low levels of iron (iron deficiency anemia) in people with kidney disease. Iron is a mineral that plays an important role in making red blood cells, which carry oxygen from your lungs to the rest of your body. This medicine may be used for other purposes; ask your health care provider or pharmacist if you have questions. COMMON BRAND NAME(S): Ferrlecit, Nulecit What should I tell my care team before I take this medication? They need to know if you have any of the following conditions: Anemia that is not from iron deficiency High levels of iron in the blood An unusual or allergic reaction to iron, other medications, foods, dyes, or preservatives Pregnant or are trying to become pregnant Breast-feeding How should I use this medication? This medication is injected into a vein. It is given by your care team in a hospital or clinic setting. Talk to your care team about the use of this medication in children. While it may be prescribed for children as young as 6 years for selected conditions, precautions do apply. Overdosage: If you think you have taken too much of this medicine contact a poison control center or emergency room at once. NOTE: This medicine is only for you. Do not share this medicine with others. What if I miss a dose? It is important not to miss your dose. Call your care team if you are unable to keep an appointment. What may interact with this medication? Do not take this medication with any of the following: Deferasirox Deferoxamine Dimercaprol This medication may also interact with the following: Other iron products This list may not describe all possible interactions. Give your health care provider a list of all the medicines, herbs, non-prescription drugs, or dietary supplements you use. Also tell them if you smoke, drink  alcohol, or use illegal drugs. Some items may interact with your medicine. What should I watch for while using this medication? Your condition will be monitored carefully while you are receiving this medication. Visit your care team for regular checks on your progress. You may need blood work while you are taking this medication. What side effects may I notice from receiving this medication? Side effects that you should report to your care team as soon as possible: Allergic reactions-skin rash, itching, hives, swelling of the face, lips, tongue, or throat Low blood pressure-dizziness, feeling faint or lightheaded, blurry vision Shortness of breath Side effects that usually do not require medical attention (report to your care team if they continue or are bothersome): Flushing Headache Joint pain Muscle pain Nausea Pain, redness, or irritation at injection site This list may not describe all possible side effects. Call your doctor for medical advice about side effects. You may report side effects to FDA at 1-800-FDA-1088. Where should I keep my medication? This medication is given in a hospital or clinic and will not be stored at home. NOTE: This sheet is a summary. It may not cover all possible information. If you have questions about this medicine, talk to your doctor, pharmacist, or health care provider.  2022 Elsevier/Gold Standard (2020-05-06 11:46:34)  

## 2021-02-12 ENCOUNTER — Other Ambulatory Visit: Payer: Self-pay | Admitting: Family Medicine

## 2021-02-12 DIAGNOSIS — L309 Dermatitis, unspecified: Secondary | ICD-10-CM

## 2021-02-13 ENCOUNTER — Telehealth: Payer: Self-pay | Admitting: Family Medicine

## 2021-02-13 DIAGNOSIS — L309 Dermatitis, unspecified: Secondary | ICD-10-CM

## 2021-02-13 MED ORDER — TRIAMCINOLONE ACETONIDE 0.1 % EX OINT: 1.0000 "application " | TOPICAL_OINTMENT | Freq: Two times a day (BID) | CUTANEOUS | 3 refills | Status: DC

## 2021-02-13 NOTE — Telephone Encounter (Signed)
Refill sent.

## 2021-02-13 NOTE — Telephone Encounter (Signed)
Medication: triamcinolone ointment (KENALOG) 0.1 %  Has the patient contacted their pharmacy? No. (If no, request that the patient contact the pharmacy for the refill.) (If yes, when and what did the pharmacy advise?)    Preferred Pharmacy (with phone number or street name):  [id number] - [place], [place] - [address]  [address], [place] [address]  Phone:  [phone]  Fax:  [phone]     Agent: Please be advised that RX refills may take up to 3 business days. We ask that you follow-up with your pharmacy.

## 2021-02-25 ENCOUNTER — Encounter: Payer: Self-pay | Admitting: Family Medicine

## 2021-02-25 MED ORDER — MEGESTROL ACETATE 40 MG PO TABS: 40.0000 mg | ORAL_TABLET | Freq: Every day | ORAL | 3 refills | Status: DC

## 2021-05-07 ENCOUNTER — Encounter: Payer: Self-pay | Admitting: Family Medicine

## 2021-05-08 ENCOUNTER — Other Ambulatory Visit: Payer: Self-pay

## 2021-05-08 MED ORDER — FLUCONAZOLE 150 MG PO TABS: 150.0000 mg | ORAL_TABLET | Freq: Once | ORAL | 0 refills | Status: AC

## 2021-05-21 ENCOUNTER — Encounter: Payer: Self-pay | Admitting: Family

## 2021-05-28 ENCOUNTER — Encounter: Payer: Self-pay | Admitting: Family

## 2021-05-28 ENCOUNTER — Inpatient Hospital Stay: Payer: BC Managed Care – PPO

## 2021-05-28 ENCOUNTER — Inpatient Hospital Stay: Payer: BC Managed Care – PPO | Admitting: Family

## 2021-06-11 ENCOUNTER — Ambulatory Visit: Payer: Self-pay | Admitting: Family

## 2021-06-11 ENCOUNTER — Inpatient Hospital Stay: Payer: BC Managed Care – PPO

## 2021-06-13 ENCOUNTER — Other Ambulatory Visit: Payer: Self-pay

## 2021-06-13 ENCOUNTER — Inpatient Hospital Stay: Payer: BC Managed Care – PPO | Attending: Family

## 2021-06-13 ENCOUNTER — Encounter: Payer: Self-pay | Admitting: Family

## 2021-06-13 ENCOUNTER — Inpatient Hospital Stay (HOSPITAL_BASED_OUTPATIENT_CLINIC_OR_DEPARTMENT_OTHER): Payer: BC Managed Care – PPO | Admitting: Family

## 2021-06-13 VITALS — BP 149/78 | HR 96 | Temp 98.4°F | Resp 19 | Wt 246.0 lb

## 2021-06-13 DIAGNOSIS — N921 Excessive and frequent menstruation with irregular cycle: Secondary | ICD-10-CM

## 2021-06-13 DIAGNOSIS — N920 Excessive and frequent menstruation with regular cycle: Secondary | ICD-10-CM | POA: Insufficient documentation

## 2021-06-13 DIAGNOSIS — D573 Sickle-cell trait: Secondary | ICD-10-CM

## 2021-06-13 DIAGNOSIS — D500 Iron deficiency anemia secondary to blood loss (chronic): Secondary | ICD-10-CM

## 2021-06-13 LAB — RETICULOCYTES
Immature Retic Fract: 13.7 % (ref 2.3–15.9)
RBC.: 4.54 MIL/uL (ref 3.87–5.11)
Retic Count, Absolute: 92.2 10*3/uL (ref 19.0–186.0)
Retic Ct Pct: 2 % (ref 0.4–3.1)

## 2021-06-13 LAB — CBC WITH DIFFERENTIAL (CANCER CENTER ONLY)
Abs Immature Granulocytes: 0.01 10*3/uL (ref 0.00–0.07)
Basophils Absolute: 0 10*3/uL (ref 0.0–0.1)
Basophils Relative: 0 %
Eosinophils Absolute: 0.2 10*3/uL (ref 0.0–0.5)
Eosinophils Relative: 3 %
HCT: 37.5 % (ref 36.0–46.0)
Hemoglobin: 13.1 g/dL (ref 12.0–15.0)
Immature Granulocytes: 0 %
Lymphocytes Relative: 51 %
Lymphs Abs: 3.7 10*3/uL (ref 0.7–4.0)
MCH: 28.9 pg (ref 26.0–34.0)
MCHC: 34.9 g/dL (ref 30.0–36.0)
MCV: 82.6 fL (ref 80.0–100.0)
Monocytes Absolute: 0.3 10*3/uL (ref 0.1–1.0)
Monocytes Relative: 4 %
Neutro Abs: 3.1 10*3/uL (ref 1.7–7.7)
Neutrophils Relative %: 42 %
Platelet Count: 276 10*3/uL (ref 150–400)
RBC: 4.54 MIL/uL (ref 3.87–5.11)
RDW: 13.7 % (ref 11.5–15.5)
Smear Review: NORMAL
WBC Count: 7.4 10*3/uL (ref 4.0–10.5)
nRBC: 0 % (ref 0.0–0.2)

## 2021-06-13 LAB — IRON AND IRON BINDING CAPACITY (CC-WL,HP ONLY)
Iron: 62 ug/dL (ref 28–170)
Saturation Ratios: 19 % (ref 10.4–31.8)
TIBC: 321 ug/dL (ref 250–450)
UIBC: 259 ug/dL (ref 148–442)

## 2021-06-13 NOTE — Progress Notes (Signed)
?Hematology and Oncology Follow Up Visit ? ?Cheryl Mullen ?[id number] ?09-Sep-1990 31 y.o. ?06/13/2021 ? ? ?Principle Diagnosis:  ?Iron deficiency anemia secondary to menorrhagia  ?  ?Current Therapy:        ?IV iron as indicated  ?  ?Interim History:  [name] is here today with her precious little girl for follow-up. She is symptomatic with fatigue and weakness.  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  ?Her cycle is still quite heavy. No other blood loss noted. No bruising or petechiae.  ?No fever, chills, n/v, cough, rash, dizziness, SOB, chest pain, palpitations, abdominal pain or changes in bowel or bladder habits.  ?No swelling, tenderness, numbness or tingling in her extremities at this time.  ?No falls or syncope.  ?Appetite is ok and she is doing her best to stay well hydrated. Her weight is 202 lbs.  ? ?ECOG Performance Status: 1 - Symptomatic but completely ambulatory ? ?Medications:  ?Allergies as of 06/13/2021   ? ?   Reactions  ? Prednisone Other (See Comments)  ? Hear rate speeds up  ? Nickel Hives  ? ?  ? ?  ?Medication List  ?  ? ?  ? Accurate as of June 13, 2021 12:53 PM. If you have any questions, ask your nurse or doctor.  ?  ?  ? ?  ? ?folic acid 1 MG tablet ?Commonly known as: FOLVITE ?Take 1 tablet (1 mg total) by mouth daily. ?  ?levocetirizine 5 MG tablet ?Commonly known as: Xyzal ?Take 1 tablet (5 mg total) by mouth every evening. ?  ?megestrol 40 MG tablet ?Commonly known as: MEGACE ?Take 1 tablet (40 mg total) by mouth daily. Can increase to two tablets twice a day in the event of heavy bleeding ?  ?montelukast 10 MG tablet ?Commonly known as: SINGULAIR ?Take 1 tablet (10 mg total) by mouth at bedtime. ?  ?triamcinolone ointment 0.1 % ?Commonly known as: KENALOG ?Apply 1 application topically 2 (two) times daily. ?  ?Vitamin D (Ergocalciferol) 1.25 MG (50000 UNIT) Caps capsule ?Commonly known as: DRISDOL ?Take 1 capsule (50,000 Units total) by mouth every 7  (seven) days. ?  ? ?  ? ? ?Allergies:  ?Allergies  ?Allergen Reactions  ? Prednisone Other (See Comments)  ?  Hear rate speeds up  ? Nickel Hives  ? ? ?Past Medical History, Surgical history, Social history, and Family History were reviewed and updated. ? ?Review of Systems: ?All other 10 point review of systems is negative.  ? ?Physical Exam: ? vitals were not taken for this visit.  ? ?Wt Readings from Last 3 Encounters:  ?11/25/20 252 lb (114.3 kg)  ?09/11/20 249 lb (112.9 kg)  ?07/10/20 247 lb (112 kg)  ? ? ?Ocular: Sclerae unicteric, pupils equal, round and reactive to light ?Ear-nose-throat: Oropharynx clear, dentition fair ?Lymphatic: No cervical or supraclavicular adenopathy ?Lungs no rales or rhonchi, good excursion bilaterally ?Heart regular rate and rhythm, no murmur appreciated ?Abd soft, nontender, positive bowel sounds ?MSK no focal spinal tenderness, no joint edema ?Neuro: non-focal, well-oriented, appropriate affect ?Breasts: Deferred  ? ?Lab Results  ?Component Value Date  ? WBC 7.4 06/13/2021  ? HGB 13.1 06/13/2021  ? HCT 37.5 06/13/2021  ? MCV 82.6 06/13/2021  ? PLT 276 06/13/2021  ? ?Lab Results  ?Component Value Date  ? FERRITIN 201 11/25/2020  ? IRON 58 11/25/2020  ? TIBC 293 11/25/2020  ? UIBC 235 11/25/2020  ? IRONPCTSAT 20 (L) 11/25/2020  ? ?  Lab Results  ?Component Value Date  ? RETICCTPCT 2.0 06/13/2021  ? RBC 4.54 06/13/2021  ? RBC 4.54 06/13/2021  ? ?No results found for: KPAFRELGTCHN, LAMBDASER, KAPLAMBRATIO ?No results found for: IGGSERUM, IGA, IGMSERUM ?No results found for: TOTALPROTELP, ALBUMINELP, A1GS, A2GS, BETS, BETA2SER, GAMS, MSPIKE, SPEI ?  Chemistry   ?   ?Component Value Date/Time  ? NA 138 09/14/2019 1525  ? NA 142 06/18/2017 1140  ? K 4.0 09/14/2019 1525  ? CL 103 09/14/2019 1525  ? CO2 29 09/14/2019 1525  ? BUN 11 09/14/2019 1525  ? BUN 22 (H) 06/18/2017 1140  ? CREATININE 1.00 09/14/2019 1525  ?    ?Component Value Date/Time  ? CALCIUM 9.4 09/14/2019 1525  ? ALKPHOS 78  09/14/2019 1525  ? AST 17 09/14/2019 1525  ? ALT 19 09/14/2019 1525  ? BILITOT 0.3 09/14/2019 1525  ?  ? ? ? ?Impression and Plan: Ms. Cheryl Mullen is a very pleasant  yo African American female with iron deficiency anemia secondary to heavy cycles.  ?Iron studies pending. We will replace if needed.  ?Follow-up in 6 months.  ? ?Lottie Dawson, NP ?3/10/202312:53 PM ? ?

## 2021-06-16 ENCOUNTER — Telehealth: Payer: Self-pay | Admitting: *Deleted

## 2021-06-16 LAB — FERRITIN: Ferritin: 176 ng/mL (ref 11–307)

## 2021-06-16 NOTE — Telephone Encounter (Signed)
Per 06/13/21 los - called and lvm of upcoming appointments - requested call back to confirm ?

## 2021-12-01 ENCOUNTER — Encounter: Payer: Self-pay | Admitting: Family Medicine

## 2021-12-15 ENCOUNTER — Inpatient Hospital Stay: Payer: BC Managed Care – PPO | Attending: Hematology & Oncology

## 2021-12-15 ENCOUNTER — Inpatient Hospital Stay: Payer: BC Managed Care – PPO | Admitting: Family

## 2021-12-15 ENCOUNTER — Encounter: Payer: Self-pay | Admitting: Family

## 2021-12-15 VITALS — BP 126/85 | HR 94 | Temp 98.6°F | Resp 18 | Wt 252.4 lb

## 2021-12-15 DIAGNOSIS — D500 Iron deficiency anemia secondary to blood loss (chronic): Secondary | ICD-10-CM | POA: Diagnosis present

## 2021-12-15 DIAGNOSIS — D573 Sickle-cell trait: Secondary | ICD-10-CM

## 2021-12-15 DIAGNOSIS — N920 Excessive and frequent menstruation with regular cycle: Secondary | ICD-10-CM | POA: Insufficient documentation

## 2021-12-15 DIAGNOSIS — N921 Excessive and frequent menstruation with irregular cycle: Secondary | ICD-10-CM

## 2021-12-15 LAB — CBC WITH DIFFERENTIAL (CANCER CENTER ONLY)
Abs Immature Granulocytes: 0.05 10*3/uL (ref 0.00–0.07)
Basophils Absolute: 0 10*3/uL (ref 0.0–0.1)
Basophils Relative: 0 %
Eosinophils Absolute: 0.1 10*3/uL (ref 0.0–0.5)
Eosinophils Relative: 2 %
HCT: 37.3 % (ref 36.0–46.0)
Hemoglobin: 12.8 g/dL (ref 12.0–15.0)
Immature Granulocytes: 1 %
Lymphocytes Relative: 41 %
Lymphs Abs: 3.2 10*3/uL (ref 0.7–4.0)
MCH: 29.8 pg (ref 26.0–34.0)
MCHC: 34.3 g/dL (ref 30.0–36.0)
MCV: 86.7 fL (ref 80.0–100.0)
Monocytes Absolute: 0.4 10*3/uL (ref 0.1–1.0)
Monocytes Relative: 5 %
Neutro Abs: 4 10*3/uL (ref 1.7–7.7)
Neutrophils Relative %: 51 %
Platelet Count: 265 10*3/uL (ref 150–400)
RBC: 4.3 MIL/uL (ref 3.87–5.11)
RDW: 13.2 % (ref 11.5–15.5)
WBC Count: 7.8 10*3/uL (ref 4.0–10.5)
nRBC: 0 % (ref 0.0–0.2)

## 2021-12-15 LAB — RETICULOCYTES
Immature Retic Fract: 23.4 % — ABNORMAL HIGH (ref 2.3–15.9)
RBC.: 4.38 MIL/uL (ref 3.87–5.11)
Retic Count, Absolute: 155.5 10*3/uL (ref 19.0–186.0)
Retic Ct Pct: 3.6 % — ABNORMAL HIGH (ref 0.4–3.1)

## 2021-12-15 LAB — FERRITIN: Ferritin: 138 ng/mL (ref 11–307)

## 2021-12-15 NOTE — Progress Notes (Signed)
Hematology and Oncology Follow Up Visit  Cheryl Mullen [id number] 1990-08-12 31 y.o. 12/15/2021   Principle Diagnosis:  Iron deficiency anemia secondary to menorrhagia    Current Therapy:        IV iron as indicated    Interim History:  Cheryl Mullen is here today with her mom for follow-up. She is feeling tired.  She stopped taking her Megace in August and her cycle last 8 days with heavy flow.  No other blood loss noted. No bruising or petechiae.  No fever, chills, n/v, cough, rash, dizziness, SOB, chest pain, palpitations, abdominal pain or changes in bowel or bladder habits.  No swelling, tenderness, numbness or tingling in her extremities at this time.  No falls or syncope reported.  Appetite and hydration are good. Her weight is stable at 252 lbs.   ECOG Performance Status: 1 - Symptomatic but completely ambulatory  Medications:  Allergies as of 12/15/2021       Reactions   Prednisone Other (See Comments)   Hear rate speeds up   Nickel Hives        Medication List        Accurate as of December 15, 2021  1:39 PM. If you have any questions, ask your nurse or doctor.          folic acid 1 MG tablet Commonly known as: FOLVITE Take 1 tablet (1 mg total) by mouth daily.   levocetirizine 5 MG tablet Commonly known as: Xyzal Take 1 tablet (5 mg total) by mouth every evening.   megestrol 40 MG tablet Commonly known as: MEGACE Take 1 tablet (40 mg total) by mouth daily. Can increase to two tablets twice a day in the event of heavy bleeding   montelukast 10 MG tablet Commonly known as: SINGULAIR Take 1 tablet (10 mg total) by mouth at bedtime.   triamcinolone ointment 0.1 % Commonly known as: KENALOG Apply 1 application topically 2 (two) times daily.   Vitamin D (Ergocalciferol) 1.25 MG (50000 UNIT) Caps capsule Commonly known as: DRISDOL Take 1 capsule (50,000 Units total) by mouth every 7 (seven) days.        Allergies:  Allergies  Allergen  Reactions   Prednisone Other (See Comments)    Hear rate speeds up   Nickel Hives    Past Medical History, Surgical history, Social history, and Family History were reviewed and updated.  Review of Systems: All other 10 point review of systems is negative.   Physical Exam:  weight is 252 lb 6.4 oz (114.5 kg). Her oral temperature is 98.6 F (37 C). Her blood pressure is 126/85 and her pulse is 94. Her respiration is 18 and oxygen saturation is 100%.   Wt Readings from Last 3 Encounters:  12/15/21 252 lb 6.4 oz (114.5 kg)  06/13/21 246 lb (111.6 kg)  11/25/20 252 lb (114.3 kg)    Ocular: Sclerae unicteric, pupils equal, round and reactive to light Ear-nose-throat: Oropharynx clear, dentition fair Lymphatic: No cervical or supraclavicular adenopathy Lungs no rales or rhonchi, good excursion bilaterally Heart regular rate and rhythm, no murmur appreciated Abd soft, nontender, positive bowel sounds MSK no focal spinal tenderness, no joint edema Neuro: non-focal, well-oriented, appropriate affect Breasts: Deferred   Lab Results  Component Value Date   WBC 7.8 12/15/2021   HGB 12.8 12/15/2021   HCT 37.3 12/15/2021   MCV 86.7 12/15/2021   PLT 265 12/15/2021   Lab Results  Component Value Date   FERRITIN 176 06/13/2021  IRON 62 06/13/2021   TIBC 321 06/13/2021   UIBC 259 06/13/2021   IRONPCTSAT 19 06/13/2021   Lab Results  Component Value Date   RETICCTPCT 3.6 (H) 12/15/2021   RBC 4.30 12/15/2021   RBC 4.38 12/15/2021   No results found for: "KPAFRELGTCHN", "LAMBDASER", "KAPLAMBRATIO" No results found for: "IGGSERUM", "IGA", "IGMSERUM" No results found for: "TOTALPROTELP", "ALBUMINELP", "A1GS", "A2GS", "BETS", "BETA2SER", "GAMS", "MSPIKE", "SPEI"   Chemistry      Component Value Date/Time   NA 138 09/14/2019 1525   NA 142 06/18/2017 1140   K 4.0 09/14/2019 1525   CL 103 09/14/2019 1525   CO2 29 09/14/2019 1525   BUN 11 09/14/2019 1525   BUN 22 (H) 06/18/2017  1140   CREATININE 1.00 09/14/2019 1525      Component Value Date/Time   CALCIUM 9.4 09/14/2019 1525   ALKPHOS 78 09/14/2019 1525   AST 17 09/14/2019 1525   ALT 19 09/14/2019 1525   BILITOT 0.3 09/14/2019 1525       Impression and Plan: Cheryl Mullen is a very pleasant 31 yo African American female with iron deficiency anemia secondary to heavy cycles.  Iron studies are pending.  Follow-up in 6 months.   Eileen Stanford, NP 9/11/20231:39 PM

## 2021-12-16 LAB — IRON AND IRON BINDING CAPACITY (CC-WL,HP ONLY)
Iron: 59 ug/dL (ref 28–170)
Saturation Ratios: 19 % (ref 10.4–31.8)
TIBC: 319 ug/dL (ref 250–450)
UIBC: 260 ug/dL (ref 148–442)

## 2021-12-17 ENCOUNTER — Encounter: Payer: Self-pay | Admitting: Family

## 2022-03-05 ENCOUNTER — Encounter: Payer: Self-pay | Admitting: Family Medicine

## 2022-03-09 ENCOUNTER — Encounter: Payer: Self-pay | Admitting: Family Medicine

## 2022-03-09 ENCOUNTER — Ambulatory Visit (INDEPENDENT_AMBULATORY_CARE_PROVIDER_SITE_OTHER): Payer: BC Managed Care – PPO | Admitting: Family Medicine

## 2022-03-09 VITALS — BP 138/90 | HR 96 | Temp 98.9°F | Resp 18 | Ht 60.0 in | Wt 254.4 lb

## 2022-03-09 DIAGNOSIS — J40 Bronchitis, not specified as acute or chronic: Secondary | ICD-10-CM | POA: Diagnosis not present

## 2022-03-09 MED ORDER — AZITHROMYCIN 250 MG PO TABS: ORAL_TABLET | ORAL | 0 refills | Status: DC

## 2022-03-09 MED ORDER — PROMETHAZINE-DM 6.25-15 MG/5ML PO SYRP: 5.0000 mL | ORAL_SOLUTION | Freq: Four times a day (QID) | ORAL | 0 refills | Status: DC | PRN

## 2022-03-09 NOTE — Patient Instructions (Signed)

## 2022-03-09 NOTE — Progress Notes (Addendum)
Subjective:   By signing my name below, I, Cheryl Mullen, attest that this documentation has been prepared under the direction and in the presence of [name] 03/09/2022   Patient ID: Cheryl Mullen, female    DOB: 02-10-1991, 31 y.o.   MRN: [id number]  Chief Complaint  Patient presents with   Cough    X1 week, productive cough, losing her voice, no congestions. NO OTC meds.      HPI Patient is in today for an office visit  She complains of a productive that begun last week. She also has associated symptoms of loss of voice. She states that her symptoms worsened over the weekend. Her mucus is yellow in color and the cough is keeping her awake at night. She is currently taking 5 mg of Xyzal and 10 mg of Singulair. She is also taking iron supplements PRN. She has tried cough drops. She has not taken a Covid test.   She complains of a lump in the back of her head. She states that the symptom appeared years ago. She denies of any pain in the area.    Past Medical History:  Diagnosis Date   Allergy    Anemia    Eczema    Gestational diabetes     Past Surgical History:  Procedure Laterality Date   CESAREAN SECTION N/A 06/07/2017   Procedure: CESAREAN SECTION;  Surgeon: Tereso Newcomer, MD;  Location: [facility];  Service: Obstetrics;  Laterality: N/A;   TONSILLECTOMY AND ADENOIDECTOMY Bilateral 01/02/2013   WISDOM TOOTH EXTRACTION      Family History  Problem Relation Age of Onset   Hypertension Mother    Hyperlipidemia Mother    Hypertension Father    Hyperlipidemia Father    Hypertension Maternal Grandmother    Hypertension Maternal Grandfather    Diabetes Maternal Grandfather    Hypertension Paternal Grandmother    Hypertension Paternal Grandfather    Cancer Neg Hx     Social History   Socioeconomic History   Marital status: Married    Spouse name: Not on file   Number of children: Not on file   Years of education: Not on file   Highest  education level: Not on file  Occupational History   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  Tobacco Use   Smoking status: Never   Smokeless tobacco: Never  Vaping Use   Vaping Use: Never used  Substance and Sexual Activity   Alcohol use: Yes    Alcohol/week: 0.0 standard drinks of alcohol   Drug use: No   Sexual activity: Yes    Partners: Female  Other Topics Concern   Not on file  Social History Narrative   Exercise--- NO   Social Determinants of Health   Financial Resource Strain: Low Risk  (09/29/2018)   Overall Financial Resource Strain (CARDIA)    Difficulty of Paying Living Expenses: Not very hard  Food Insecurity: No Food Insecurity (09/29/2018)   Hunger Vital Sign    Worried About Running Out of Food in the Last Year: Never true    Ran Out of Food in the Last Year: Never true  Transportation Needs: No Transportation Needs (09/29/2018)   PRAPARE - Administrator, Civil Service (Medical): No    Lack of Transportation (Non-Medical): No  Physical Activity: Not on file  Stress: Stress Concern Present (09/29/2018)   [employer] of Occupational Health - Occupational Stress Questionnaire    Feeling of Stress : To  some extent  Social Connections: Not on file  Intimate Partner Violence: Not on file    Outpatient Medications Prior to Visit  Medication Sig Dispense Refill   folic acid (FOLVITE) 1 MG tablet Take 1 tablet (1 mg total) by mouth daily. 30 tablet 11   levocetirizine (XYZAL) 5 MG tablet Take 1 tablet (5 mg total) by mouth every evening. 30 tablet 5   megestrol (MEGACE) 40 MG tablet Take 1 tablet (40 mg total) by mouth daily. Can increase to two tablets twice a day in the event of heavy bleeding 90 tablet 3   montelukast (SINGULAIR) 10 MG tablet Take 1 tablet (10 mg total) by mouth at bedtime. 90 tablet 1   triamcinolone ointment (KENALOG) 0.1 % Apply 1 application topically 2 (two) times daily. 80 g 3   Vitamin D, Ergocalciferol, (DRISDOL) 1.25 MG (50000  UNIT) CAPS capsule Take 1 capsule (50,000 Units total) by mouth every 7 (seven) days. (Patient not taking: Reported on 12/15/2021) 12 capsule 1   No facility-administered medications prior to visit.    Allergies  Allergen Reactions   Prednisone Other (See Comments)    Hear rate speeds up   Nickel Hives    Review of Systems  Constitutional:  Negative for fever and malaise/fatigue.  HENT:  Negative for congestion.        (+) Loss of Voice (+) Lump (Back of Head)  Eyes:  Negative for blurred vision.  Respiratory:  Positive for cough (Productive) and sputum production (Yellow). Negative for shortness of breath.   Cardiovascular:  Negative for chest pain, palpitations and leg swelling.  Gastrointestinal:  Negative for abdominal pain, blood in stool and nausea.  Genitourinary:  Negative for dysuria and frequency.  Musculoskeletal:  Negative for falls.  Skin:  Negative for rash.  Neurological:  Negative for dizziness, loss of consciousness and headaches.  Endo/Heme/Allergies:  Negative for environmental allergies.  Psychiatric/Behavioral:  Negative for depression. The patient is not nervous/anxious.        Objective:    Physical Exam Vitals and nursing note reviewed.  Constitutional:      General: She is not in acute distress.    Appearance: Normal appearance. She is not ill-appearing.  HENT:     Head: Normocephalic and atraumatic.     Comments: Rubbery size of quarter back of neck (non tender)    Right Ear: External ear normal.     Left Ear: External ear normal.     Nose: Congestion present.     Mouth/Throat:     Mouth: Mucous membranes are moist.     Pharynx: No oropharyngeal exudate or posterior oropharyngeal erythema.  Eyes:     Extraocular Movements: Extraocular movements intact.     Pupils: Pupils are equal, round, and reactive to light.  Cardiovascular:     Rate and Rhythm: Normal rate and regular rhythm.     Heart sounds: Normal heart sounds. No murmur heard.    No  gallop.  Pulmonary:     Effort: Pulmonary effort is normal. No respiratory distress.     Breath sounds: Wheezing present. No rales.  Skin:    General: Skin is warm and dry.  Neurological:     General: No focal deficit present.     Mental Status: She is alert and oriented to person, place, and time. Mental status is at baseline.  Psychiatric:        Mood and Affect: Mood normal.        Thought Content: Thought  content normal.        Judgment: Judgment normal.     BP (!) 138/90 (BP Location: Left Arm, Patient Position: Sitting, Cuff Size: Large)   Pulse 96   Temp 98.9 F (37.2 C) (Oral)   Resp 18   Ht 5' (1.524 m)   Wt 254 lb 6.4 oz (115.4 kg)   SpO2 93%   BMI 49.68 kg/m  Wt Readings from Last 3 Encounters:  03/09/22 254 lb 6.4 oz (115.4 kg)  12/15/21 252 lb 6.4 oz (114.5 kg)  06/13/21 246 lb (111.6 kg)    Diabetic Foot Exam - Simple   No data filed    Lab Results  Component Value Date   WBC 7.8 12/15/2021   HGB 12.8 12/15/2021   HCT 37.3 12/15/2021   PLT 265 12/15/2021   GLUCOSE 100 (H) 09/14/2019   ALT 19 09/14/2019   AST 17 09/14/2019   NA 138 09/14/2019   K 4.0 09/14/2019   CL 103 09/14/2019   CREATININE 1.00 09/14/2019   BUN 11 09/14/2019   CO2 29 09/14/2019   TSH 1.14 09/14/2019   HGBA1C 5.3 12/17/2016    Lab Results  Component Value Date   TSH 1.14 09/14/2019   Lab Results  Component Value Date   WBC 7.8 12/15/2021   HGB 12.8 12/15/2021   HCT 37.3 12/15/2021   MCV 86.7 12/15/2021   PLT 265 12/15/2021   Lab Results  Component Value Date   NA 138 09/14/2019   K 4.0 09/14/2019   CO2 29 09/14/2019   GLUCOSE 100 (H) 09/14/2019   BUN 11 09/14/2019   CREATININE 1.00 09/14/2019   BILITOT 0.3 09/14/2019   ALKPHOS 78 09/14/2019   AST 17 09/14/2019   ALT 19 09/14/2019   PROT 7.3 09/14/2019   ALBUMIN 4.3 09/14/2019   CALCIUM 9.4 09/14/2019   GFR 79.47 09/14/2019   No results found for: "CHOL" No results found for: "HDL" No results found  for: "LDLCALC" No results found for: "TRIG" No results found for: "CHOLHDL" Lab Results  Component Value Date   HGBA1C 5.3 12/17/2016       Assessment & Plan:   Problem List Items Addressed This Visit       Unprioritized   Bronchitis - Primary   Relevant Medications   azithromycin (ZITHROMAX Z-PAK) 250 MG tablet   promethazine-dextromethorphan (PROMETHAZINE-DM) 6.25-15 MG/5ML syrup   Meds ordered this encounter  Medications   azithromycin (ZITHROMAX Z-PAK) 250 MG tablet    Sig: As directed    Dispense:  6 each    Refill:  0   promethazine-dextromethorphan (PROMETHAZINE-DM) 6.25-15 MG/5ML syrup    Sig: Take 5 mLs by mouth 4 (four) times daily as needed.    Dispense:  118 mL    Refill:  0    I, Donato Schultz, DO, personally preformed the services described in this documentation.  All medical record entries made by the scribe were at my direction and in my presence.  I have reviewed the chart and discharge instructions (if applicable) and agree that the record reflects my personal performance and is accurate and complete. 03/09/2022   I,Amber Collins,acting as a scribe for Donato Schultz, DO.,have documented all relevant documentation on the behalf of Donato Schultz, DO,as directed by  Donato Schultz, DO while in the presence of Donato Schultz, DO.    Donato Schultz, DO

## 2022-03-12 ENCOUNTER — Encounter: Payer: Self-pay | Admitting: Family

## 2022-03-15 ENCOUNTER — Encounter: Payer: Self-pay | Admitting: Family Medicine

## 2022-03-16 ENCOUNTER — Other Ambulatory Visit: Payer: Self-pay | Admitting: Family Medicine

## 2022-03-16 DIAGNOSIS — N760 Acute vaginitis: Secondary | ICD-10-CM

## 2022-03-16 MED ORDER — FLUCONAZOLE 150 MG PO TABS: 150.0000 mg | ORAL_TABLET | Freq: Every day | ORAL | 0 refills | Status: DC

## 2022-04-07 ENCOUNTER — Encounter: Payer: Self-pay | Admitting: Family

## 2022-04-17 ENCOUNTER — Ambulatory Visit (INDEPENDENT_AMBULATORY_CARE_PROVIDER_SITE_OTHER): Payer: BC Managed Care – PPO | Admitting: Family Medicine

## 2022-04-17 ENCOUNTER — Encounter: Payer: Self-pay | Admitting: Family Medicine

## 2022-04-17 VITALS — BP 134/88 | HR 85 | Temp 98.1°F | Resp 18 | Ht 60.0 in | Wt 254.2 lb

## 2022-04-17 DIAGNOSIS — D573 Sickle-cell trait: Secondary | ICD-10-CM

## 2022-04-17 DIAGNOSIS — E669 Obesity, unspecified: Secondary | ICD-10-CM

## 2022-04-17 DIAGNOSIS — Z0000 Encounter for general adult medical examination without abnormal findings: Secondary | ICD-10-CM | POA: Diagnosis not present

## 2022-04-17 DIAGNOSIS — L309 Dermatitis, unspecified: Secondary | ICD-10-CM

## 2022-04-17 DIAGNOSIS — R21 Rash and other nonspecific skin eruption: Secondary | ICD-10-CM | POA: Diagnosis not present

## 2022-04-17 DIAGNOSIS — J302 Other seasonal allergic rhinitis: Secondary | ICD-10-CM | POA: Diagnosis not present

## 2022-04-17 DIAGNOSIS — Z1159 Encounter for screening for other viral diseases: Secondary | ICD-10-CM

## 2022-04-17 MED ORDER — ZEPBOUND 2.5 MG/0.5ML ~~LOC~~ SOAJ: 2.5000 mg | SUBCUTANEOUS | 0 refills | Status: DC

## 2022-04-17 MED ORDER — MONTELUKAST SODIUM 10 MG PO TABS: 10.0000 mg | ORAL_TABLET | Freq: Every day | ORAL | 1 refills | Status: DC

## 2022-04-17 MED ORDER — LEVOCETIRIZINE DIHYDROCHLORIDE 5 MG PO TABS: 5.0000 mg | ORAL_TABLET | Freq: Every evening | ORAL | 3 refills | Status: DC

## 2022-04-17 MED ORDER — FOLIC ACID 1 MG PO TABS: 1.0000 mg | ORAL_TABLET | Freq: Every day | ORAL | 3 refills | Status: DC

## 2022-04-17 MED ORDER — TRIAMCINOLONE ACETONIDE 0.1 % EX OINT: 1.0000 | TOPICAL_OINTMENT | Freq: Two times a day (BID) | CUTANEOUS | 3 refills | Status: DC

## 2022-04-17 NOTE — Patient Instructions (Signed)

## 2022-04-17 NOTE — Progress Notes (Signed)
Subjective:   By signing my name below, I, Shehryar Baig, attest that this documentation has been prepared under the direction and in the presence of [name], DO. 04/17/2022   Patient ID: Cheryl Mullen, female    DOB: [birth year], 32 y.o.   MRN: [id number]  No chief complaint on file.   HPI Patient is in today for a comprehensive physical exam.  She has not visited her GYN recently. She has a new GYN.  She denies having any fever, new moles, congestion, sore throat, new muscle pain, new joint pain, chest pain, cough, SOB, wheezing, n/v/d, constipation, blood in stool, dysuria, frequency, hematuria, or headaches at this time.  She has had two Covid-19 vaccines. She is not UTD on eye care. She is not interested in seeing a dermatologist for her face mole. She is not interested in an influenza vaccine.  She reports no changes in family history or surgical history. She denies smoking. She is trying to exercise but is having difficulty due to fatigue following work. She is interested in starting a weight loss medication.  Pap Smear: Last completed 01/24/2020. Due in 3 years.    Past Medical History:  Diagnosis Date   Allergy    Anemia    Eczema    Gestational diabetes     Past Surgical History:  Procedure Laterality Date   CESAREAN SECTION N/A 06/07/2017   Procedure: CESAREAN SECTION;  Surgeon: Osborne Oman, MD;  Location: [facility];  Service: Obstetrics;  Laterality: N/A;   TONSILLECTOMY AND ADENOIDECTOMY Bilateral 01/02/2013   WISDOM TOOTH EXTRACTION      Family History  Problem Relation Age of Onset   Hypertension Mother    Hyperlipidemia Mother    Hypertension Father    Hyperlipidemia Father    Hypertension Maternal Grandmother    Hypertension Maternal Grandfather    Diabetes Maternal Grandfather    Hypertension Paternal Grandmother    Hypertension Paternal Grandfather    Cancer Neg Hx     Social History   Socioeconomic History    Marital status: Married    Spouse name: Not on file   Number of children: Not on file   Years of education: Not on file   Highest education level: Not on file  Occupational History   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  Tobacco Use   Smoking status: Never   Smokeless tobacco: Never  Vaping Use   Vaping Use: Never used  Substance and Sexual Activity   Alcohol use: Yes    Alcohol/week: 0.0 standard drinks of alcohol   Drug use: No   Sexual activity: Yes    Partners: Female  Other Topics Concern   Not on file  Social History Narrative   Exercise--- NO   Social Determinants of Health   Financial Resource Strain: Low Risk  (09/29/2018)   Overall Financial Resource Strain (CARDIA)    Difficulty of Paying Living Expenses: Not very hard  Food Insecurity: No Food Insecurity (09/29/2018)   Hunger Vital Sign    Worried About Running Out of Food in the Last Year: Never true    Ran Out of Food in the Last Year: Never true  Transportation Needs: No Transportation Needs (09/29/2018)   PRAPARE - Hydrologist (Medical): No    Lack of Transportation (Non-Medical): No  Physical Activity: Not on file  Stress: Stress Concern Present (09/29/2018)   [place]  of Stress : To some extent  Social Connections: Not on file  Intimate Partner Violence: Not on file    Outpatient Medications Prior to Visit  Medication Sig Dispense Refill   azithromycin (ZITHROMAX Z-PAK) 250 MG tablet As directed 6 each 0   fluconazole (DIFLUCAN) 150 MG tablet Take 1 tablet (150 mg total) by mouth daily. May repeat in 3 days as needed 2 tablet 0   folic acid (FOLVITE) 1 MG tablet Take 1 tablet (1 mg total) by mouth daily. 30 tablet 11   levocetirizine (XYZAL) 5 MG tablet Take 1 tablet (5 mg total) by mouth every evening. 30 tablet 5   megestrol (MEGACE) 40 MG tablet Take 1 tablet (40 mg total) by mouth daily. Can  increase to two tablets twice a day in the event of heavy bleeding 90 tablet 3   montelukast (SINGULAIR) 10 MG tablet Take 1 tablet (10 mg total) by mouth at bedtime. 90 tablet 1   promethazine-dextromethorphan (PROMETHAZINE-DM) 6.25-15 MG/5ML syrup Take 5 mLs by mouth 4 (four) times daily as needed. 118 mL 0   triamcinolone ointment (KENALOG) 0.1 % Apply 1 application topically 2 (two) times daily. 80 g 3   No facility-administered medications prior to visit.    Allergies  Allergen Reactions   Prednisone Other (See Comments)    Hear rate speeds up   Nickel Hives    Review of Systems  Constitutional:  Negative for fever.  HENT:  Negative for congestion, sinus pain and sore throat.   Respiratory:  Negative for cough, shortness of breath and wheezing.   Cardiovascular:  Negative for chest pain and palpitations.  Gastrointestinal:  Negative for abdominal pain, constipation, diarrhea, nausea and vomiting.  Genitourinary:  Negative for dysuria, frequency and hematuria.  Musculoskeletal:        (-)new muscle pain (-)new joint pain  Skin:        (-)New moles  Neurological:  Negative for headaches.       Objective:    Physical Exam Constitutional:      General: She is not in acute distress.    Appearance: Normal appearance. She is not ill-appearing.  HENT:     Head: Normocephalic and atraumatic.     Right Ear: Tympanic membrane, ear canal and external ear normal.     Left Ear: Tympanic membrane, ear canal and external ear normal.  Eyes:     Extraocular Movements: Extraocular movements intact.     Pupils: Pupils are equal, round, and reactive to light.  Cardiovascular:     Rate and Rhythm: Normal rate and regular rhythm.     Heart sounds: Normal heart sounds. No murmur heard.    No gallop.  Pulmonary:     Effort: Pulmonary effort is normal. No respiratory distress.     Breath sounds: Normal breath sounds. No wheezing or rales.  Abdominal:     General: Bowel sounds are  normal. There is no distension.     Palpations: Abdomen is soft.     Tenderness: There is no abdominal tenderness. There is no guarding.  Skin:    General: Skin is warm and dry.  Neurological:     Mental Status: She is alert and oriented to person, place, and time.  Psychiatric:        Judgment: Judgment normal.     There were no vitals taken for this visit. Wt Readings from Last 3 Encounters:  03/09/22 254 lb 6.4 oz (115.4 kg)  12/15/21 252 lb 6.4 oz (  114.5 kg)  06/13/21 246 lb (111.6 kg)       Assessment & Plan:  There are no diagnoses linked to this encounter.  I, Shehryar [place], personally preformed the services described in this documentation.  All medical record entries made by the scribe were at my direction and in my presence.  I have reviewed the chart and discharge instructions (if applicable) and agree that the record reflects my personal performance and is accurate and complete. 04/17/2022   I,Shehryar Baig,acting as a scribe for [name], DO.,have documented all relevant documentation on the behalf of [name], DO,as directed by  [name], DO while in the presence of [name], DO.   Shehryar [employer]

## 2022-04-19 ENCOUNTER — Other Ambulatory Visit: Payer: Self-pay | Admitting: Family Medicine

## 2022-04-19 NOTE — Assessment & Plan Note (Signed)
Refill steroid cream

## 2022-04-19 NOTE — Assessment & Plan Note (Signed)
D/w pt diet and exercise Zebound  F/u 3 months or sooner as needed

## 2022-04-20 ENCOUNTER — Encounter: Payer: Self-pay | Admitting: Family Medicine

## 2022-04-20 LAB — LIPID PANEL
Cholesterol: 183 mg/dL (ref <200)
HDL: 34 mg/dL — ABNORMAL LOW (ref >=50)
LDL Cholesterol (Calc): 125 mg/dL (calc) — ABNORMAL HIGH
Non-HDL Cholesterol (Calc): 149 mg/dL (calc) — ABNORMAL HIGH (ref <130)
Total CHOL/HDL Ratio: 5.4 (calc) — ABNORMAL HIGH (ref <5.0)
Triglycerides: 129 mg/dL (ref <150)

## 2022-04-20 LAB — COMPREHENSIVE METABOLIC PANEL
AG Ratio: 1.3 (calc) (ref 1.0–2.5)
ALT: 46 U/L — ABNORMAL HIGH (ref 6–29)
AST: 54 U/L — ABNORMAL HIGH (ref 10–30)
Albumin: 4.4 g/dL (ref 3.6–5.1)
Alkaline phosphatase (APISO): 71 U/L (ref 31–125)
BUN: 10 mg/dL (ref 7–25)
CO2: 23 mmol/L (ref 20–32)
Calcium: 9.4 mg/dL (ref 8.6–10.2)
Chloride: 103 mmol/L (ref 98–110)
Creat: 0.93 mg/dL (ref 0.50–0.97)
Globulin: 3.3 g/dL (calc) (ref 1.9–3.7)
Glucose, Bld: 111 mg/dL — ABNORMAL HIGH (ref 65–99)
Potassium: 4.3 mmol/L (ref 3.5–5.3)
Sodium: 139 mmol/L (ref 135–146)
Total Bilirubin: 0.4 mg/dL (ref 0.2–1.2)
Total Protein: 7.7 g/dL (ref 6.1–8.1)

## 2022-04-20 LAB — CBC WITH DIFFERENTIAL/PLATELET
Absolute Monocytes: 319 cells/uL (ref 200–950)
Basophils Absolute: 17 cells/uL (ref 0–200)
Basophils Relative: 0.3 %
Eosinophils Absolute: 128 cells/uL (ref 15–500)
Eosinophils Relative: 2.2 %
HCT: 37.3 % (ref 35.0–45.0)
Hemoglobin: 12.3 g/dL (ref 11.7–15.5)
Lymphs Abs: 2482 cells/uL (ref 850–3900)
MCH: 28.6 pg (ref 27.0–33.0)
MCHC: 33 g/dL (ref 32.0–36.0)
MCV: 86.7 fL (ref 80.0–100.0)
MPV: 11 fL (ref 7.5–12.5)
Monocytes Relative: 5.5 %
Neutro Abs: 2854 cells/uL (ref 1500–7800)
Neutrophils Relative %: 49.2 %
Platelets: 283 10*3/uL (ref 140–400)
RBC: 4.3 10*6/uL (ref 3.80–5.10)
RDW: 14.1 % (ref 11.0–15.0)
Total Lymphocyte: 42.8 %
WBC: 5.8 10*3/uL (ref 3.8–10.8)

## 2022-04-20 LAB — HEPATITIS C ANTIBODY: Hepatitis C Ab: NONREACTIVE

## 2022-04-20 LAB — TSH: TSH: 1.22 mIU/L

## 2022-04-24 ENCOUNTER — Encounter: Payer: Self-pay | Admitting: Family Medicine

## 2022-04-28 ENCOUNTER — Emergency Department (HOSPITAL_BASED_OUTPATIENT_CLINIC_OR_DEPARTMENT_OTHER): Payer: BC Managed Care – PPO

## 2022-04-28 ENCOUNTER — Ambulatory Visit: Payer: BC Managed Care – PPO | Admitting: Family Medicine

## 2022-04-28 ENCOUNTER — Emergency Department (HOSPITAL_BASED_OUTPATIENT_CLINIC_OR_DEPARTMENT_OTHER)
Admission: EM | Admit: 2022-04-28 | Discharge: 2022-04-28 | Disposition: A | Discharge status: Home / Self Care | Payer: BC Managed Care – PPO | Attending: Emergency Medicine | Admitting: Emergency Medicine

## 2022-04-28 ENCOUNTER — Encounter (HOSPITAL_BASED_OUTPATIENT_CLINIC_OR_DEPARTMENT_OTHER): Payer: Self-pay | Admitting: Emergency Medicine

## 2022-04-28 DIAGNOSIS — J45909 Unspecified asthma, uncomplicated: Secondary | ICD-10-CM | POA: Insufficient documentation

## 2022-04-28 DIAGNOSIS — K529 Noninfective gastroenteritis and colitis, unspecified: Secondary | ICD-10-CM | POA: Diagnosis not present

## 2022-04-28 DIAGNOSIS — K760 Fatty (change of) liver, not elsewhere classified: Secondary | ICD-10-CM | POA: Insufficient documentation

## 2022-04-28 DIAGNOSIS — R109 Unspecified abdominal pain: Secondary | ICD-10-CM

## 2022-04-28 DIAGNOSIS — R112 Nausea with vomiting, unspecified: Secondary | ICD-10-CM

## 2022-04-28 LAB — CBC WITH DIFFERENTIAL/PLATELET
Abs Immature Granulocytes: 0.02 10*3/uL (ref 0.00–0.07)
Basophils Absolute: 0 10*3/uL (ref 0.0–0.1)
Basophils Relative: 0 %
Eosinophils Absolute: 0.2 10*3/uL (ref 0.0–0.5)
Eosinophils Relative: 3 %
HCT: 38.9 % (ref 36.0–46.0)
Hemoglobin: 13.2 g/dL (ref 12.0–15.0)
Immature Granulocytes: 0 %
Lymphocytes Relative: 37 %
Lymphs Abs: 2.1 10*3/uL (ref 0.7–4.0)
MCH: 28.5 pg (ref 26.0–34.0)
MCHC: 33.9 g/dL (ref 30.0–36.0)
MCV: 84 fL (ref 80.0–100.0)
Monocytes Absolute: 0.4 10*3/uL (ref 0.1–1.0)
Monocytes Relative: 7 %
Neutro Abs: 3 10*3/uL (ref 1.7–7.7)
Neutrophils Relative %: 53 %
Platelets: 297 10*3/uL (ref 150–400)
RBC: 4.63 MIL/uL (ref 3.87–5.11)
RDW: 13.1 % (ref 11.5–15.5)
WBC: 5.7 10*3/uL (ref 4.0–10.5)
nRBC: 0 % (ref 0.0–0.2)

## 2022-04-28 LAB — URINALYSIS, ROUTINE W REFLEX MICROSCOPIC
Bilirubin Urine: NEGATIVE
Glucose, UA: NEGATIVE mg/dL
Hgb urine dipstick: NEGATIVE
Ketones, ur: NEGATIVE mg/dL
Leukocytes,Ua: NEGATIVE
Nitrite: NEGATIVE
Protein, ur: NEGATIVE mg/dL
Specific Gravity, Urine: 1.02 (ref 1.005–1.030)
pH: 6.5 (ref 5.0–8.0)

## 2022-04-28 LAB — COMPREHENSIVE METABOLIC PANEL
ALT: 57 U/L — ABNORMAL HIGH (ref 0–44)
AST: 52 U/L — ABNORMAL HIGH (ref 15–41)
Albumin: 4.3 g/dL (ref 3.5–5.0)
Alkaline Phosphatase: 72 U/L (ref 38–126)
Anion gap: 9 (ref 5–15)
BUN: 8 mg/dL (ref 6–20)
CO2: 26 mmol/L (ref 22–32)
Calcium: 8.3 mg/dL — ABNORMAL LOW (ref 8.9–10.3)
Chloride: 102 mmol/L (ref 98–111)
Creatinine, Ser: 0.84 mg/dL (ref 0.44–1.00)
GFR, Estimated: >60 mL/min (ref >60)
Glucose, Bld: 119 mg/dL — ABNORMAL HIGH (ref 70–99)
Potassium: 3.6 mmol/L (ref 3.5–5.1)
Sodium: 137 mmol/L (ref 135–145)
Total Bilirubin: 0.8 mg/dL (ref 0.3–1.2)
Total Protein: 8.8 g/dL — ABNORMAL HIGH (ref 6.5–8.1)

## 2022-04-28 LAB — LIPASE, BLOOD: Lipase: 43 U/L (ref 11–51)

## 2022-04-28 LAB — PREGNANCY, URINE: Preg Test, Ur: NEGATIVE

## 2022-04-28 MED ORDER — PANTOPRAZOLE SODIUM 20 MG PO TBEC: 20.0000 mg | DELAYED_RELEASE_TABLET | Freq: Every day | ORAL | 0 refills | Status: DC

## 2022-04-28 MED ORDER — ONDANSETRON 4 MG PO TBDP: 4.0000 mg | ORAL_TABLET | Freq: Three times a day (TID) | ORAL | 0 refills | Status: DC | PRN

## 2022-04-28 MED ORDER — IOHEXOL 300 MG/ML  SOLN
100.0000 mL | Freq: Once | INTRAMUSCULAR | Status: AC | PRN
  Administered 2022-04-28: 100 mL via INTRAVENOUS

## 2022-04-28 MED ORDER — ONDANSETRON HCL 4 MG/2ML IJ SOLN
4.0000 mg | Freq: Once | INTRAMUSCULAR | Status: AC
  Administered 2022-04-28: 4 mg via INTRAVENOUS
  Filled 2022-04-28: qty 2

## 2022-04-28 MED ORDER — LACTATED RINGERS IV BOLUS
1000.0000 mL | Freq: Once | INTRAVENOUS | Status: AC
  Administered 2022-04-28: 1000 mL via INTRAVENOUS

## 2022-04-28 NOTE — ED Notes (Signed)
Reviewed discharge instructions, medication and follow up. Pt states understanding. Accompanied by family. Ambulatory at time of discharge

## 2022-04-28 NOTE — ED Provider Notes (Signed)
[facility] Provider Note   CSN: [id number] Arrival date & time: 04/28/22  2979     History {Add pertinent medical, surgical, social history, OB history to HPI:1} Chief Complaint  Patient presents with   Vomiting    Cheryl Mullen is a 32 y.o. female.  HPI     32 year old female with history of allergy, eczema, gestational diabetes, who presents with concern for nausea, vomiting and abdominal pain.  Reports she has had nausea and vomiting since Thursday.  Has not been able to keep down any food or drink, has been vomiting up everything.  Vomiting multiple times a day.  Initially she had constipation.  Reports she did have some diarrhea last night.  She has some epigastric/periumbilical abdominal pain which is rated 5 out of 10.  Describes as a cramping pain.  The pain gets worse prior to needing to vomit.  She has had a history of C-section in the past, no other abdominal surgeries.  Denies any fever, cough, congestion, sore throat, urinary symptoms.  She does have some mild chronic cough due to asthma and reflux but nothing new.  She denies alcohol smoking or drug use or excessive NSAID use.  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  Past Medical History:  Diagnosis Date   Allergy    Anemia    Eczema    Gestational diabetes      Home Medications Prior to Admission medications   Medication Sig Start Date End Date Taking? Authorizing Provider  fluconazole (DIFLUCAN) 150 MG tablet Take 1 tablet (150 mg total) by mouth daily. May repeat in 3 days as needed 03/16/22   Carollee Herter, Alferd Apa, DO  folic acid (FOLVITE) 1 MG tablet Take 1 tablet (1 mg total) by mouth daily. 04/17/22   [name], DO  levocetirizine (XYZAL) 5 MG tablet Take 1 tablet (5 mg total) by mouth every evening. 04/17/22   [name], DO  megestrol (MEGACE) 40 MG tablet Take 1 tablet (40 mg total) by mouth daily. Can increase to  two tablets twice a day in the event of heavy bleeding 02/25/21   Truett Mainland, DO  montelukast (SINGULAIR) 10 MG tablet Take 1 tablet (10 mg total) by mouth at bedtime. 04/17/22   [name], DO  tirzepatide (ZEPBOUND) 2.5 MG/0.5ML Pen Inject 2.5 mg into the skin once a week. 04/17/22   [name], DO  triamcinolone ointment (KENALOG) 0.1 % Apply 1 Application topically 2 (two) times daily. 04/17/22   [name], DO      Allergies    Prednisone and Nickel    Review of Systems   Review of Systems  Physical Exam Updated Vital Signs BP (!) 136/90 (BP Location: Left Arm)   Pulse 91   Temp 98.2 F (36.8 C) (Oral)   Resp 16   Ht 5' (1.524 m)   Wt 114.3 kg   LMP 03/21/2022   SpO2 99%   BMI 49.22 kg/m  Physical Exam  ED Results / Procedures / Treatments   Labs (all labs ordered are listed, but only abnormal results are displayed) Labs Reviewed  CBC WITH DIFFERENTIAL/PLATELET  COMPREHENSIVE METABOLIC PANEL  LIPASE, BLOOD  PREGNANCY, URINE  URINALYSIS, ROUTINE W REFLEX MICROSCOPIC    EKG None  Radiology No results found.  Procedures Procedures  {Document cardiac monitor, telemetry assessment procedure when appropriate:1}  Medications Ordered in ED  Medications  lactated ringers bolus 1,000 mL (has no administration in time range)  ondansetron (ZOFRAN) injection 4 mg (has no administration in time range)    ED Course/ Medical Decision Making/ A&P   {   Click here for ABCD2, HEART and other calculatorsREFRESH Note before signing :1}                           32 year old female with history of allergy, eczema, gestational diabetes, who presents with concern for nausea, vomiting and abdominal pain.  DDx includes appendicitis, pancreatitis, cholecystitis, pyelonephritis, nephrolithiasis, diverticulitis, SBO, gastroenteritis, PID, ovarian torsion, ectopic pregnancy, and tuboovarian abscess.  Labs completed and personally evaluated by  me show ***  CT abdomen pelvis is ***  {Document critical care time when appropriate:1} {Document review of labs and clinical decision tools ie heart score, Chads2Vasc2 etc:1}  {Document your independent review of radiology images, and any outside records:1} {Document your discussion with family members, caretakers, and with consultants:1} {Document social determinants of health affecting pt's care:1} {Document your decision making why or why not admission, treatments were needed:1} Final Clinical Impression(s) / ED Diagnoses Final diagnoses:  None    Rx / DC Orders ED Discharge Orders     None

## 2022-04-28 NOTE — ED Triage Notes (Signed)
Emesis since Thursday, unable to keep food or water down. 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

## 2022-05-25 ENCOUNTER — Encounter: Payer: Self-pay | Admitting: Family Medicine

## 2022-05-25 ENCOUNTER — Ambulatory Visit: Payer: BC Managed Care – PPO | Admitting: Family Medicine

## 2022-05-25 ENCOUNTER — Other Ambulatory Visit: Payer: Self-pay | Admitting: Family Medicine

## 2022-05-25 VITALS — BP 124/84 | HR 88 | Temp 98.5°F | Resp 18 | Ht 60.0 in | Wt 249.4 lb

## 2022-05-25 DIAGNOSIS — N912 Amenorrhea, unspecified: Secondary | ICD-10-CM | POA: Insufficient documentation

## 2022-05-25 DIAGNOSIS — R739 Hyperglycemia, unspecified: Secondary | ICD-10-CM | POA: Diagnosis not present

## 2022-05-25 DIAGNOSIS — E119 Type 2 diabetes mellitus without complications: Secondary | ICD-10-CM

## 2022-05-25 LAB — COMPREHENSIVE METABOLIC PANEL
ALT: 32 U/L (ref 0–35)
AST: 28 U/L (ref 0–37)
Albumin: 4.3 g/dL (ref 3.5–5.2)
Alkaline Phosphatase: 77 U/L (ref 39–117)
BUN: 9 mg/dL (ref 6–23)
CO2: 29 mEq/L (ref 19–32)
Calcium: 9.3 mg/dL (ref 8.4–10.5)
Chloride: 103 mEq/L (ref 96–112)
Creatinine, Ser: 0.99 mg/dL (ref 0.40–1.20)
GFR: 76.03 mL/min (ref >60.00)
Glucose, Bld: 94 mg/dL (ref 70–99)
Potassium: 4 mEq/L (ref 3.5–5.1)
Sodium: 137 mEq/L (ref 135–145)
Total Bilirubin: 0.4 mg/dL (ref 0.2–1.2)
Total Protein: 8.1 g/dL (ref 6.0–8.3)

## 2022-05-25 LAB — CBC WITH DIFFERENTIAL/PLATELET
Basophils Absolute: 0 10*3/uL (ref 0.0–0.1)
Basophils Relative: 0.4 % (ref 0.0–3.0)
Eosinophils Absolute: 0.2 10*3/uL (ref 0.0–0.7)
Eosinophils Relative: 3.1 % (ref 0.0–5.0)
HCT: 39.3 % (ref 36.0–46.0)
Hemoglobin: 13.1 g/dL (ref 12.0–15.0)
Lymphocytes Relative: 47.3 % — ABNORMAL HIGH (ref 12.0–46.0)
Lymphs Abs: 2.7 10*3/uL (ref 0.7–4.0)
MCHC: 33.3 g/dL (ref 30.0–36.0)
MCV: 84.7 fl (ref 78.0–100.0)
Monocytes Absolute: 0.4 10*3/uL (ref 0.1–1.0)
Monocytes Relative: 7.1 % (ref 3.0–12.0)
Neutro Abs: 2.4 10*3/uL (ref 1.4–7.7)
Neutrophils Relative %: 42.1 % — ABNORMAL LOW (ref 43.0–77.0)
Platelets: 299 10*3/uL (ref 150.0–400.0)
RBC: 4.64 Mil/uL (ref 3.87–5.11)
RDW: 13.9 % (ref 11.5–15.5)
WBC: 5.8 10*3/uL (ref 4.0–10.5)

## 2022-05-25 LAB — LIPID PANEL
Cholesterol: 168 mg/dL (ref 0–200)
HDL: 29 mg/dL — ABNORMAL LOW (ref >39.00)
LDL Cholesterol: 104 mg/dL — ABNORMAL HIGH (ref 0–99)
NonHDL: 138.72
Total CHOL/HDL Ratio: 6
Triglycerides: 174 mg/dL — ABNORMAL HIGH (ref 0.0–149.0)
VLDL: 34.8 mg/dL (ref 0.0–40.0)

## 2022-05-25 LAB — POCT URINE PREGNANCY: Preg Test, Ur: NEGATIVE

## 2022-05-25 LAB — HCG, QUANTITATIVE, PREGNANCY: Quantitative HCG: <0.6 m[IU]/mL

## 2022-05-25 LAB — TSH: TSH: 1.44 u[IU]/mL (ref 0.35–5.50)

## 2022-05-25 LAB — HEMOGLOBIN A1C: Hgb A1c MFr Bld: 7.1 % — ABNORMAL HIGH (ref 4.6–6.5)

## 2022-05-25 MED ORDER — OZEMPIC (0.25 OR 0.5 MG/DOSE) 2 MG/3ML ~~LOC~~ SOPN: PEN_INJECTOR | SUBCUTANEOUS | 3 refills | Status: DC

## 2022-05-25 NOTE — Progress Notes (Addendum)
Subjective:   By signing my name below, I, Cheryl Mullen, attest that this documentation has been prepared under the direction and in the presence of Cheryl Held, DO 05/25/22   Patient ID: Cheryl Mullen, female    DOB: 1990-08-24, 32 y.o.   MRN: [id number]  Chief Complaint  Patient presents with   Amenorrhea    Pt states being 30 days late for her period. Pt states she took a pregnancy when her period was 2 weeks late and was negative.     HPI Patient is in today for an office visit.   She reports a 30 day missed period. She took an at home pregnancy test in January that was negative.   She had an abdominal and pelvic ultrasound when she was hospitalized last month. She denies any abdominal pain or cramps.   She stated she had an IUD removed in 09/2020.   Past Medical History:  Diagnosis Date   Allergy    Anemia    Eczema    Gestational diabetes     Past Surgical History:  Procedure Laterality Date   CESAREAN SECTION N/A 06/07/2017   Procedure: CESAREAN SECTION;  Surgeon: Osborne Oman, MD;  Location: [facility];  Service: Obstetrics;  Laterality: N/A;   TONSILLECTOMY AND ADENOIDECTOMY Bilateral 01/02/2013   WISDOM TOOTH EXTRACTION      Family History  Problem Relation Age of Onset   Hypertension Mother    Hyperlipidemia Mother    Hypertension Father    Hyperlipidemia Father    Hypertension Maternal Grandmother    Hypertension Maternal Grandfather    Diabetes Maternal Grandfather    Hypertension Paternal Grandmother    Hypertension Paternal Grandfather    Cancer Neg Hx     Social History   Socioeconomic History   Marital status: Married    Spouse name: Not on file   Number of children: Not on file   Years of education: Not on file   Highest education level: Not on file  Occupational History   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  Tobacco Use   Smoking status: Never   Smokeless tobacco: Never  Vaping Use   Vaping Use: Never used   Substance and Sexual Activity   Alcohol use: Yes    Alcohol/week: 0.0 standard drinks of alcohol   Drug use: No   Sexual activity: Yes    Partners: Male  Other Topics Concern   Not on file  Social History Narrative   Exercise--- NO   Social Determinants of Health   Financial Resource Strain: Low Risk  (09/29/2018)   Overall Financial Resource Strain (CARDIA)    Difficulty of Paying Living Expenses: Not very hard  Food Insecurity: No Food Insecurity (09/29/2018)   Hunger Vital Sign    Worried About Running Out of Food in the Last Year: Never true    Ran Out of Food in the Last Year: Never true  Transportation Needs: No Transportation Needs (09/29/2018)   PRAPARE - Hydrologist (Medical): No    Lack of Transportation (Non-Medical): No  Physical Activity: Not on file  Stress: Stress Concern Present (09/29/2018)   [place]    Feeling of Stress : To some extent  Social Connections: Not on file  Intimate Partner Violence: Not on file    Outpatient Medications Prior to Visit  Medication Sig Dispense Refill   folic acid (FOLVITE) 1 MG tablet Take 1 tablet (  1 mg total) by mouth daily. 90 tablet 3   levocetirizine (XYZAL) 5 MG tablet Take 1 tablet (5 mg total) by mouth every evening. 90 tablet 3   montelukast (SINGULAIR) 10 MG tablet Take 1 tablet (10 mg total) by mouth at bedtime. 90 tablet 1   triamcinolone ointment (KENALOG) 0.1 % Apply 1 Application topically 2 (two) times daily. 80 g 3   fluconazole (DIFLUCAN) 150 MG tablet Take 1 tablet (150 mg total) by mouth daily. May repeat in 3 days as needed 2 tablet 0   megestrol (MEGACE) 40 MG tablet Take 1 tablet (40 mg total) by mouth daily. Can increase to two tablets twice a day in the event of heavy bleeding 90 tablet 3   ondansetron (ZOFRAN-ODT) 4 MG disintegrating tablet Take 1 tablet (4 mg total) by mouth every 8 (eight) hours as needed  for nausea or vomiting. 20 tablet 0   pantoprazole (PROTONIX) 20 MG tablet Take 1 tablet (20 mg total) by mouth daily for 14 days. 14 tablet 0   tirzepatide (ZEPBOUND) 2.5 MG/0.5ML Pen Inject 2.5 mg into the skin once a week. 5 mL 0   No facility-administered medications prior to visit.    Allergies  Allergen Reactions   Prednisone Other (See Comments)    Hear rate speeds up   Nickel Hives    Review of Systems  Constitutional:  Negative for fever and malaise/fatigue.       Missed period (30 days)  HENT:  Negative for congestion.   Eyes:  Negative for blurred vision.  Respiratory:  Negative for shortness of breath.   Cardiovascular:  Negative for chest pain, palpitations and leg swelling.  Gastrointestinal:  Negative for abdominal pain, blood in stool and nausea.  Genitourinary:  Negative for dysuria and frequency.  Musculoskeletal:  Negative for falls.  Skin:  Negative for rash.  Neurological:  Negative for dizziness, tremors, loss of consciousness and headaches.  Endo/Heme/Allergies:  Negative for environmental allergies.  Psychiatric/Behavioral:  Negative for depression. The patient is not nervous/anxious.        Objective:    Physical Exam Vitals and nursing note reviewed.  Constitutional:      Appearance: She is well-developed.  HENT:     Head: Normocephalic and atraumatic.  Eyes:     Conjunctiva/sclera: Conjunctivae normal.  Neck:     Thyroid: No thyromegaly.     Vascular: No carotid bruit or JVD.  Cardiovascular:     Rate and Rhythm: Normal rate and regular rhythm.     Heart sounds: Normal heart sounds. No murmur heard. Pulmonary:     Effort: Pulmonary effort is normal. No respiratory distress.     Breath sounds: Normal breath sounds. No wheezing or rales.  Chest:     Chest wall: No tenderness.  Abdominal:     General: Abdomen is flat. Bowel sounds are normal.     Palpations: Abdomen is soft.     Tenderness: There is no abdominal tenderness. There is no  right CVA tenderness, left CVA tenderness, guarding or rebound.  Musculoskeletal:     Cervical back: Normal range of motion and neck supple.  Neurological:     Mental Status: She is alert and oriented to person, place, and time.     BP 124/84 (BP Location: Left Arm, Patient Position: Sitting, Cuff Size: Large)   Pulse 88   Temp 98.5 F (36.9 C) (Oral)   Resp 18   Ht 5' (1.524 m)   Wt 249 lb 6.4  oz (113.1 kg)   LMP 03/21/2022 Comment: negative u-preg today  SpO2 97%   BMI 48.71 kg/m  Wt Readings from Last 3 Encounters:  05/25/22 249 lb 6.4 oz (113.1 kg)  04/28/22 252 lb (114.3 kg)  04/17/22 254 lb 3.2 oz (115.3 kg)       Assessment & Plan:  Amenorrhea Assessment & Plan: Check preg test  Check labs  F/u gyn   Orders: -     POCT urine pregnancy -     CBC with Differential/Platelet -     Comprehensive metabolic panel -     Lipid panel -     TSH -     Hemoglobin A1c -     hCG, quantitative, pregnancy  Hyperglycemia -     CBC with Differential/Platelet -     Comprehensive metabolic panel -     Lipid panel -     Hemoglobin A1c     I,Rachel Rivera,acting as a scribe for Cheryl Held, DO.,have documented all relevant documentation on the behalf of Cheryl Held, DO,as directed by  Cheryl Held, DO while in the presence of [place], DO, personally preformed the services described in this documentation.  All medical record entries made by the scribe were at my direction and in my presence.  I have reviewed the chart and discharge instructions (if applicable) and agree that the record reflects my personal performance and is accurate and complete. 05/25/22   Cheryl Held, DO

## 2022-05-25 NOTE — Patient Instructions (Signed)
Primary Amenorrhea Primary amenorrhea is when a female has not started having periods by the time she is 31 years old. What are the causes? This condition may be caused by: An abnormal chromosome that causes the ovaries not to work properly. This is a common cause. Polycystic ovary syndrome. Being born without a vagina, uterus, or ovaries. Cystic fibrosis. Pituitary gland tumor in the brain. Long-term illnesses. Cushing disease. A thyroid disease, such as hypothyroidism or hyperthyroidism. A part of the brain called the hypothalamus not working normally. Premature ovarian failure. Other causes of this condition include: Malnutrition. Extreme obesity. Low blood sugar (hypoglycemia). Drastic weight loss. Overexercising that leads to a loss of body fat. What are the signs or symptoms? The main symptom of this condition is a female not having had a period by 32 years of age. Other symptoms may include: Discharge from the breasts. Hot flashes. Adult acne. Facial or chest hair. Headaches. Impaired vision. Recent excessive stress. Changes in weight, diet, or exercise patterns. How is this diagnosed? This condition may be diagnosed based on: Your medical history. A physical exam. Blood tests. Urine tests. You may also have other tests, including: Ultrasound of the pelvis. MRI of the head. How is this treated? Treatment for this condition depends on the cause. Treatment may include lifestyle changes, surgery, or medicine. Follow these instructions at home:     Eat a healthy diet. A healthy diet includes lots of fruits and vegetables, low-fat dairy products, lean meats, and foods that contain fiber. Maintain a healthy weight. Talk to your health care provider before trying any new diet or exercise plan Get regular exercise. Aim for 30 minutes of moderate-intensity activity 5 times a week. Examples of moderate-intensity activity include walking and yoga. Be sure to talk with your  health care provider before starting any exercise routine. Take over-the-counter and prescription medicines only as told by your health care provider. Keep all follow-up visits. This is important. Contact a health care provider if: You have pelvic pain. You gain an unusual amount of weight. You have an unusual amount of hair growth. Summary Primary amenorrhea is when a female has not started having periods by the time she is 32 years old. This condition has many causes, including abnormal ovaries, obesity, extreme weight loss. Contact a health care provider if you have pelvic pain, gain an unusual amount of weight, or have an unusual amount of hair growth. This information is not intended to replace advice given to you by your health care provider. Make sure you discuss any questions you have with your health care provider. Document Revised: 11/08/2019 Document Reviewed: 11/08/2019 Elsevier Patient Education  [place].

## 2022-05-25 NOTE — Assessment & Plan Note (Signed)
Check preg test  Check labs  F/u gyn

## 2022-05-26 ENCOUNTER — Telehealth: Payer: Self-pay

## 2022-05-26 ENCOUNTER — Telehealth: Payer: Self-pay | Admitting: *Deleted

## 2022-05-26 NOTE — Telephone Encounter (Signed)
Opened in error

## 2022-05-26 NOTE — Telephone Encounter (Signed)
PA initiated via Covermymeds; KEY: [id number]. Awaiting determination.

## 2022-05-26 NOTE — Telephone Encounter (Signed)
PA approved.   PA Case: [id number], Status: Approved, Coverage Starts on: 05/26/2022 12:00:00 AM, Coverage Ends on: 05/26/2023 12:00:00 AM.. Authorization Expiration Date: May 26, 2023.

## 2022-06-24 ENCOUNTER — Encounter: Payer: Self-pay | Admitting: Family Medicine

## 2022-06-25 MED ORDER — NORGESTIMATE-ETH ESTRADIOL 0.25-35 MG-MCG PO TABS: 1.0000 | ORAL_TABLET | Freq: Every day | ORAL | 3 refills | Status: DC

## 2022-06-25 NOTE — Addendum Note (Signed)
Addended by: Truett Mainland on: 06/25/2022 08:16 AM   Modules accepted: Orders

## 2022-07-06 ENCOUNTER — Inpatient Hospital Stay (HOSPITAL_BASED_OUTPATIENT_CLINIC_OR_DEPARTMENT_OTHER): Payer: BC Managed Care – PPO | Admitting: Family

## 2022-07-06 ENCOUNTER — Inpatient Hospital Stay: Payer: BC Managed Care – PPO | Attending: Hematology & Oncology

## 2022-07-06 VITALS — BP 133/76 | HR 90 | Resp 17 | Ht 60.0 in | Wt 239.0 lb

## 2022-07-06 DIAGNOSIS — D573 Sickle-cell trait: Secondary | ICD-10-CM | POA: Diagnosis not present

## 2022-07-06 DIAGNOSIS — D500 Iron deficiency anemia secondary to blood loss (chronic): Secondary | ICD-10-CM

## 2022-07-06 DIAGNOSIS — N920 Excessive and frequent menstruation with regular cycle: Secondary | ICD-10-CM | POA: Diagnosis present

## 2022-07-06 DIAGNOSIS — N921 Excessive and frequent menstruation with irregular cycle: Secondary | ICD-10-CM

## 2022-07-06 LAB — RETICULOCYTES
Immature Retic Fract: 31 % — ABNORMAL HIGH (ref 2.3–15.9)
RBC.: 3.24 MIL/uL — ABNORMAL LOW (ref 3.87–5.11)
Retic Count, Absolute: 149 10*3/uL (ref 19.0–186.0)
Retic Ct Pct: 4.6 % — ABNORMAL HIGH (ref 0.4–3.1)

## 2022-07-06 LAB — CBC WITH DIFFERENTIAL (CANCER CENTER ONLY)
Abs Immature Granulocytes: 0.07 10*3/uL (ref 0.00–0.07)
Basophils Absolute: 0 10*3/uL (ref 0.0–0.1)
Basophils Relative: 0 %
Eosinophils Absolute: 0.3 10*3/uL (ref 0.0–0.5)
Eosinophils Relative: 4 %
HCT: 25.9 % — ABNORMAL LOW (ref 36.0–46.0)
Hemoglobin: 8.3 g/dL — ABNORMAL LOW (ref 12.0–15.0)
Immature Granulocytes: 1 %
Lymphocytes Relative: 28 %
Lymphs Abs: 1.9 10*3/uL (ref 0.7–4.0)
MCH: 25.8 pg — ABNORMAL LOW (ref 26.0–34.0)
MCHC: 32 g/dL (ref 30.0–36.0)
MCV: 80.4 fL (ref 80.0–100.0)
Monocytes Absolute: 0.5 10*3/uL (ref 0.1–1.0)
Monocytes Relative: 8 %
Neutro Abs: 3.9 10*3/uL (ref 1.7–7.7)
Neutrophils Relative %: 59 %
Platelet Count: 307 10*3/uL (ref 150–400)
RBC: 3.22 MIL/uL — ABNORMAL LOW (ref 3.87–5.11)
RDW: 13.9 % (ref 11.5–15.5)
WBC Count: 6.6 10*3/uL (ref 4.0–10.5)
nRBC: 0 % (ref 0.0–0.2)

## 2022-07-06 LAB — FERRITIN: Ferritin: 16 ng/mL (ref 11–307)

## 2022-07-06 LAB — IRON AND IRON BINDING CAPACITY (CC-WL,HP ONLY)
Iron: 34 ug/dL (ref 28–170)
Saturation Ratios: 8 % — ABNORMAL LOW (ref 10.4–31.8)
TIBC: 414 ug/dL (ref 250–450)
UIBC: 380 ug/dL (ref 148–442)

## 2022-07-06 NOTE — Progress Notes (Signed)
Hematology and Oncology Follow Up Visit  Cheryl Mullen [id number] 09-26-1990 32 y.o. 07/06/2022   Principle Diagnosis:  Iron deficiency anemia secondary to menorrhagia    Current Therapy:        IV iron as indicated   Interim History:  Cheryl Mullen is here today with her sweet baby girl for follow-up. She is symptomatic with fatigue.  Her cycle did not occur after December 2023 until this past month and lasted for 20 days. She states that the flow was heavy. Her gynecologist is aware.  No other blood loss noted. No bruising or petechiae.  No fever, chills, n/v, cough, rash, dizziness, SOB, chest pain, palpitations, abdominal pain or changes in bowel or bladder habits.  No swelling, tenderness, numbness or tingling in her extremities.  No falls or syncope reported.  Appetite is down on Ozempic. She eats smaller meals. She is doing her best to stay well hydrated. Weight is down 13 lbs since we last saw her.   ECOG Performance Status: 1 - Symptomatic but completely ambulatory  Medications:  Allergies as of 07/06/2022       Reactions   Prednisone Other (See Comments)   Hear rate speeds up   Nickel Hives        Medication List        Accurate as of July 06, 2022  1:38 PM. If you have any questions, ask your nurse or doctor.          folic acid 1 MG tablet Commonly known as: FOLVITE Take 1 tablet (1 mg total) by mouth daily.   levocetirizine 5 MG tablet Commonly known as: Xyzal Take 1 tablet (5 mg total) by mouth every evening.   montelukast 10 MG tablet Commonly known as: SINGULAIR Take 1 tablet (10 mg total) by mouth at bedtime.   norgestimate-ethinyl estradiol 0.25-35 MG-MCG tablet Commonly known as: ORTHO-CYCLEN Take 1 tablet by mouth daily.   Ozempic (0.25 or 0.5 MG/DOSE) 2 MG/3ML Sopn Generic drug: Semaglutide(0.25 or 0.5MG /DOS) O.25 mg sq weekly x1 month then increase to 0.5 mg sq weekly Recheck labs 3 months   triamcinolone ointment 0.1 % Commonly known  as: KENALOG Apply 1 Application topically 2 (two) times daily.        Allergies:  Allergies  Allergen Reactions   Prednisone Other (See Comments)    Hear rate speeds up   Nickel Hives    Past Medical History, Surgical history, Social history, and Family History were reviewed and updated.  Review of Systems: All other 10 point review of systems is negative.   Physical Exam:  height is 5' (1.524 m) and weight is 239 lb (108.4 kg). Her blood pressure is 133/76 and her pulse is 90. Her respiration is 17 and oxygen saturation is 100%.   Wt Readings from Last 3 Encounters:  07/06/22 239 lb (108.4 kg)  05/25/22 249 lb 6.4 oz (113.1 kg)  04/28/22 252 lb (114.3 kg)    Ocular: Sclerae unicteric, pupils equal, round and reactive to light Ear-nose-throat: Oropharynx clear, dentition fair Lymphatic: No cervical or supraclavicular adenopathy Lungs no rales or rhonchi, good excursion bilaterally Heart regular rate and rhythm, no murmur appreciated Abd soft, nontender, positive bowel sounds MSK no focal spinal tenderness, no joint edema Neuro: non-focal, well-oriented, appropriate affect Breasts: Deferred   Lab Results  Component Value Date   WBC 6.6 07/06/2022   HGB 8.3 (L) 07/06/2022   HCT 25.9 (L) 07/06/2022   MCV 80.4 07/06/2022   PLT 307 07/06/2022  Lab Results  Component Value Date   FERRITIN 138 12/15/2021   IRON 59 12/15/2021   TIBC 319 12/15/2021   UIBC 260 12/15/2021   IRONPCTSAT 19 12/15/2021   Lab Results  Component Value Date   RETICCTPCT 4.6 (H) 07/06/2022   RBC 3.22 (L) 07/06/2022   No results found for: "KPAFRELGTCHN", "LAMBDASER", "KAPLAMBRATIO" No results found for: "IGGSERUM", "IGA", "IGMSERUM" No results found for: "TOTALPROTELP", "ALBUMINELP", "A1GS", "A2GS", "BETS", "BETA2SER", "GAMS", "MSPIKE", "SPEI"   Chemistry      Component Value Date/Time   NA 137 05/25/2022 1335   NA 142 06/18/2017 1140   K 4.0 05/25/2022 1335   CL 103 05/25/2022 1335    CO2 29 05/25/2022 1335   BUN 9 05/25/2022 1335   BUN 22 (H) 06/18/2017 1140   CREATININE 0.99 05/25/2022 1335   CREATININE 0.93 04/17/2022 1500      Component Value Date/Time   CALCIUM 9.3 05/25/2022 1335   ALKPHOS 77 05/25/2022 1335   AST 28 05/25/2022 1335   ALT 32 05/25/2022 1335   BILITOT 0.4 05/25/2022 1335       Impression and Plan: Cheryl Mullen is a very pleasant 32 yo African American female with iron deficiency anemia secondary to heavy cycles.  Iron studies are pending.  Follow-up in 6 months.   Lottie Dawson, NP 4/1/20241:38 PM

## 2022-07-07 ENCOUNTER — Encounter: Payer: Self-pay | Admitting: Family Medicine

## 2022-07-07 ENCOUNTER — Other Ambulatory Visit: Payer: Self-pay | Admitting: Family

## 2022-07-07 ENCOUNTER — Telehealth: Payer: Self-pay | Admitting: *Deleted

## 2022-07-07 DIAGNOSIS — E119 Type 2 diabetes mellitus without complications: Secondary | ICD-10-CM

## 2022-07-07 NOTE — Telephone Encounter (Signed)
Per scheduling message Sarah - Called patient and lvm for a call back to schedule (2) doses of IV Iron. 

## 2022-07-09 ENCOUNTER — Encounter: Payer: Self-pay | Admitting: Family Medicine

## 2022-07-09 ENCOUNTER — Other Ambulatory Visit (HOSPITAL_COMMUNITY)
Admission: RE | Admit: 2022-07-09 | Discharge: 2022-07-09 | Disposition: A | Discharge status: Home / Self Care | Payer: BC Managed Care – PPO | Source: Ambulatory Visit | Attending: Family Medicine | Admitting: Family Medicine

## 2022-07-09 ENCOUNTER — Ambulatory Visit (INDEPENDENT_AMBULATORY_CARE_PROVIDER_SITE_OTHER): Payer: BC Managed Care – PPO | Admitting: Family Medicine

## 2022-07-09 VITALS — BP 118/66 | HR 83 | Ht 60.0 in | Wt 236.0 lb

## 2022-07-09 DIAGNOSIS — N921 Excessive and frequent menstruation with irregular cycle: Secondary | ICD-10-CM | POA: Diagnosis not present

## 2022-07-09 DIAGNOSIS — Z01419 Encounter for gynecological examination (general) (routine) without abnormal findings: Secondary | ICD-10-CM | POA: Diagnosis not present

## 2022-07-09 DIAGNOSIS — Z1339 Encounter for screening examination for other mental health and behavioral disorders: Secondary | ICD-10-CM | POA: Diagnosis not present

## 2022-07-09 NOTE — Progress Notes (Signed)
ANNUAL EXAM Patient name: Cheryl Mullen MRN [id number]  Date of birth: [birth year] Chief Complaint:   Annual Exam  History of Present Illness:   Cheryl Mullen is a 32 y.o.  G46P1001  female  being seen today for a routine annual exam.  Current complaints: On Ozempic for weight loss. No tolerating 0.5mg  dose. Has lost some weight on it. Had a couple of irregular cycles before starting, then had prolonged menses on it. Improved on COC.  Patient's last menstrual period was 06/10/2022 (exact date).    Last pap 01/2020. Results were:  normal . H/O abnormal pap: yes Last mammogram: n/a Last colonoscopy: n/a     07/09/2022   10:33 AM 03/09/2022   11:03 AM 10/24/2019    3:46 PM 02/18/2017    3:55 PM 01/28/2017    8:33 AM  Depression screen PHQ 2/9  Decreased Interest 0 0 0 0 0  Down, Depressed, Hopeless 0 0 0 0 0  PHQ - 2 Score 0 0 0 0 0  Altered sleeping 1      Tired, decreased energy 1      Change in appetite 2      Feeling bad or failure about yourself  0      Trouble concentrating 0      Moving slowly or fidgety/restless 0      Suicidal thoughts 0      PHQ-9 Score 4            07/09/2022   10:33 AM  GAD 7 : Generalized Anxiety Score  Nervous, Anxious, on Edge 0  Control/stop worrying 0  Worry too much - different things 0  Trouble relaxing 0  Restless 0  Easily annoyed or irritable 0  Afraid - awful might happen 0  Total GAD 7 Score 0     Review of Systems:   Pertinent items are noted in HPI Denies any headaches, blurred vision, fatigue, shortness of breath, chest pain, abdominal pain, abnormal vaginal discharge/itching/odor/irritation, problems with periods, bowel movements, urination, or intercourse unless otherwise stated above. Pertinent History Reviewed:  Reviewed past medical,surgical, social and family history.  Reviewed problem list, medications and allergies. Physical Assessment:   Vitals:   07/09/22 1030  BP: 118/66  Pulse: 83  Weight: 236 lb (107  kg)  Height: 5' (1.524 m)  Body mass index is 46.09 kg/m.        Physical Examination:   General appearance - well appearing, and in no distress  Mental status - alert, oriented to person, place, and time  Psych:  She has a normal mood and affect  Skin - warm and dry, normal color, no suspicious lesions noted  Chest - effort normal, all lung fields clear to auscultation bilaterally  Heart - normal rate and regular rhythm  Neck:  midline trachea, no thyromegaly or nodules  Breasts - breasts appear normal, no suspicious masses, no skin or nipple changes or axillary nodes  Abdomen - soft, nontender, nondistended, no masses or organomegaly  Pelvic - VULVA: normal appearing vulva with no masses, tenderness or lesions  VAGINA: normal appearing vagina with normal color and discharge, no lesions  CERVIX: normal appearing cervix without discharge or lesions, no CMT  Thin prep pap is done with HR HPV cotesting  UTERUS: uterus is felt to be normal size, shape, consistency and nontender   ADNEXA: No adnexal masses or tenderness noted.  Extremities:  No swelling or varicosities noted  Chaperone present for exam  Assessment & Plan:  1. Well woman exam with routine gynecological exam PAP today  2. Menorrhagia with irregular cycle Improved on COC. If has abnormal cycles of COC, will need to work up for PCOS.   No orders of the defined types were placed in this encounter.   Meds: No orders of the defined types were placed in this encounter.   Follow-up: No follow-ups on file.  Truett Mainland, DO 07/09/2022 11:27 AM

## 2022-07-10 LAB — CYTOLOGY - PAP
Adequacy: ABSENT
Chlamydia: NEGATIVE
Comment: NEGATIVE
Comment: NEGATIVE
Comment: NEGATIVE
Comment: NORMAL
Diagnosis: NEGATIVE
High risk HPV: NEGATIVE
Neisseria Gonorrhea: NEGATIVE
Trichomonas: NEGATIVE

## 2022-07-14 ENCOUNTER — Encounter: Payer: Self-pay | Admitting: Family Medicine

## 2022-07-14 NOTE — Telephone Encounter (Signed)
Pt feels she is getting the "run around" and wants pcp to message her with advice, not a nurse. She stated she does not see the point in taking a pregnancy test or making an appt. Please advise.

## 2022-07-17 ENCOUNTER — Inpatient Hospital Stay: Payer: BC Managed Care – PPO

## 2022-07-17 VITALS — BP 140/79 | HR 84 | Temp 98.0°F | Resp 16

## 2022-07-17 DIAGNOSIS — D500 Iron deficiency anemia secondary to blood loss (chronic): Secondary | ICD-10-CM

## 2022-07-17 DIAGNOSIS — N921 Excessive and frequent menstruation with irregular cycle: Secondary | ICD-10-CM

## 2022-07-17 MED ORDER — SODIUM CHLORIDE 0.9 % IV SOLN: Freq: Once | INTRAVENOUS | Status: AC

## 2022-07-17 MED ORDER — SODIUM CHLORIDE 0.9 % IV SOLN
125.0000 mg | Freq: Once | INTRAVENOUS | Status: AC
  Administered 2022-07-17: 125 mg via INTRAVENOUS
  Filled 2022-07-17: qty 10

## 2022-07-17 NOTE — Patient Instructions (Signed)
Sodium Ferric Gluconate Complex Injection What is this medication? SODIUM FERRIC GLUCONATE COMPLEX (SOE dee um FER ik GLOO koe nate KOM pleks) treats low levels of iron (iron deficiency anemia) in people with kidney disease. Iron is a mineral that plays an important role in making red blood cells, which carry oxygen from your lungs to the rest of your body. This medicine may be used for other purposes; ask your health care provider or pharmacist if you have questions. COMMON BRAND NAME(S): Ferrlecit, Nulecit What should I tell my care team before I take this medication? They need to know if you have any of the following conditions: Anemia that is not from iron deficiency High levels of iron in the blood An unusual or allergic reaction to iron, other medications, foods, dyes, or preservatives Pregnant or are trying to become pregnant Breast-feeding How should I use this medication? This medication is injected into a vein. It is given by your care team in a hospital or clinic setting. Talk to your care team about the use of this medication in children. While it may be prescribed for children as young as 6 years for selected conditions, precautions do apply. Overdosage: If you think you have taken too much of this medicine contact a poison control center or emergency room at once. NOTE: This medicine is only for you. Do not share this medicine with others. What if I miss a dose? It is important not to miss your dose. Call your care team if you are unable to keep an appointment. What may interact with this medication? Do not take this medication with any of the following: Deferasirox Deferoxamine Dimercaprol This medication may also interact with the following: Other iron products This list may not describe all possible interactions. Give your health care provider a list of all the medicines, herbs, non-prescription drugs, or dietary supplements you use. Also tell them if you smoke, drink  alcohol, or use illegal drugs. Some items may interact with your medicine. What should I watch for while using this medication? Your condition will be monitored carefully while you are receiving this medication. Visit your care team for regular checks on your progress. You may need blood work while you are taking this medication. What side effects may I notice from receiving this medication? Side effects that you should report to your care team as soon as possible: Allergic reactions--skin rash, itching, hives, swelling of the face, lips, tongue, or throat Low blood pressure--dizziness, feeling faint or lightheaded, blurry vision Shortness of breath Side effects that usually do not require medical attention (report to your care team if they continue or are bothersome): Flushing Headache Joint pain Muscle pain Nausea Pain, redness, or irritation at injection site This list may not describe all possible side effects. Call your doctor for medical advice about side effects. You may report side effects to FDA at 1-800-FDA-1088. Where should I keep my medication? This medication is given in a hospital or clinic and will not be stored at home. NOTE: This sheet is a summary. It may not cover all possible information. If you have questions about this medicine, talk to your doctor, pharmacist, or health care provider.  2023 Elsevier/Gold Standard (2020-08-16 00:00:00)  

## 2022-07-23 ENCOUNTER — Encounter: Payer: Self-pay | Admitting: Family Medicine

## 2022-07-23 DIAGNOSIS — N939 Abnormal uterine and vaginal bleeding, unspecified: Secondary | ICD-10-CM

## 2022-07-24 ENCOUNTER — Inpatient Hospital Stay: Payer: BC Managed Care – PPO

## 2022-07-24 ENCOUNTER — Encounter: Payer: Self-pay | Admitting: Family

## 2022-07-24 VITALS — BP 126/76 | HR 101 | Resp 17

## 2022-07-24 DIAGNOSIS — N921 Excessive and frequent menstruation with irregular cycle: Secondary | ICD-10-CM

## 2022-07-24 DIAGNOSIS — D500 Iron deficiency anemia secondary to blood loss (chronic): Secondary | ICD-10-CM | POA: Diagnosis not present

## 2022-07-24 MED ORDER — NORGESTIMATE-ETH ESTRADIOL 0.25-35 MG-MCG PO TABS: 1.0000 | ORAL_TABLET | Freq: Every day | ORAL | 3 refills | Status: DC

## 2022-07-24 MED ORDER — ONDANSETRON 4 MG PO TBDP: 4.0000 mg | ORAL_TABLET | Freq: Four times a day (QID) | ORAL | 0 refills | Status: DC | PRN

## 2022-07-24 MED ORDER — SODIUM CHLORIDE 0.9 % IV SOLN
125.0000 mg | Freq: Once | INTRAVENOUS | Status: AC
  Administered 2022-07-24: 125 mg via INTRAVENOUS
  Filled 2022-07-24: qty 125

## 2022-07-24 MED ORDER — SODIUM CHLORIDE 0.9 % IV SOLN: Freq: Once | INTRAVENOUS | Status: AC

## 2022-07-24 NOTE — Patient Instructions (Signed)
Sodium Ferric Gluconate Complex Injection What is this medication? SODIUM FERRIC GLUCONATE COMPLEX (SOE dee um FER ik GLOO koe nate KOM pleks) treats low levels of iron (iron deficiency anemia) in people with kidney disease. Iron is a mineral that plays an important role in making red blood cells, which carry oxygen from your lungs to the rest of your body. This medicine may be used for other purposes; ask your health care provider or pharmacist if you have questions. COMMON BRAND NAME(S): Ferrlecit, Nulecit What should I tell my care team before I take this medication? They need to know if you have any of the following conditions: Anemia that is not from iron deficiency High levels of iron in the blood An unusual or allergic reaction to iron, other medications, foods, dyes, or preservatives Pregnant or are trying to become pregnant Breast-feeding How should I use this medication? This medication is injected into a vein. It is given by your care team in a hospital or clinic setting. Talk to your care team about the use of this medication in children. While it may be prescribed for children as young as 6 years for selected conditions, precautions do apply. Overdosage: If you think you have taken too much of this medicine contact a poison control center or emergency room at once. NOTE: This medicine is only for you. Do not share this medicine with others. What if I miss a dose? It is important not to miss your dose. Call your care team if you are unable to keep an appointment. What may interact with this medication? Do not take this medication with any of the following: Deferasirox Deferoxamine Dimercaprol This medication may also interact with the following: Other iron products This list may not describe all possible interactions. Give your health care provider a list of all the medicines, herbs, non-prescription drugs, or dietary supplements you use. Also tell them if you smoke, drink  alcohol, or use illegal drugs. Some items may interact with your medicine. What should I watch for while using this medication? Your condition will be monitored carefully while you are receiving this medication. Visit your care team for regular checks on your progress. You may need blood work while you are taking this medication. What side effects may I notice from receiving this medication? Side effects that you should report to your care team as soon as possible: Allergic reactions--skin rash, itching, hives, swelling of the face, lips, tongue, or throat Low blood pressure--dizziness, feeling faint or lightheaded, blurry vision Shortness of breath Side effects that usually do not require medical attention (report to your care team if they continue or are bothersome): Flushing Headache Joint pain Muscle pain Nausea Pain, redness, or irritation at injection site This list may not describe all possible side effects. Call your doctor for medical advice about side effects. You may report side effects to FDA at 1-800-FDA-1088. Where should I keep my medication? This medication is given in a hospital or clinic and will not be stored at home. NOTE: This sheet is a summary. It may not cover all possible information. If you have questions about this medicine, talk to your doctor, pharmacist, or health care provider.  2023 Elsevier/Gold Standard (2020-08-16 00:00:00)  

## 2022-08-04 NOTE — Addendum Note (Signed)
Addended by: Levie Heritage on: 08/04/2022 04:48 PM   Modules accepted: Orders

## 2022-08-10 ENCOUNTER — Ambulatory Visit (HOSPITAL_BASED_OUTPATIENT_CLINIC_OR_DEPARTMENT_OTHER)
Admission: RE | Admit: 2022-08-10 | Discharge: 2022-08-10 | Disposition: A | Discharge status: Home / Self Care | Payer: BC Managed Care – PPO | Source: Ambulatory Visit | Attending: Family Medicine | Admitting: Family Medicine

## 2022-08-10 DIAGNOSIS — N939 Abnormal uterine and vaginal bleeding, unspecified: Secondary | ICD-10-CM | POA: Diagnosis not present

## 2022-08-11 MED ORDER — NORETHINDRONE ACETATE 5 MG PO TABS: 5.0000 mg | ORAL_TABLET | Freq: Every day | ORAL | 2 refills | Status: DC

## 2022-08-11 MED ORDER — NORGESTIMATE-ETH ESTRADIOL 0.25-35 MG-MCG PO TABS: 1.0000 | ORAL_TABLET | Freq: Every day | ORAL | 3 refills | Status: DC

## 2022-08-11 NOTE — Addendum Note (Signed)
Addended by: Levie Heritage on: 08/11/2022 05:30 PM   Modules accepted: Orders

## 2022-08-25 IMAGING — US US PELVIS COMPLETE WITH TRANSVAGINAL
1 series · 13 of 25 positions shown · non-contrast
Comparison: None available.

CLINICAL DATA: Initial evaluation for acute pelvic pain with IUD.



[Series 1: us pelvis complete with transvaginal · 13 of 51 slices shown]
[im 1/51]
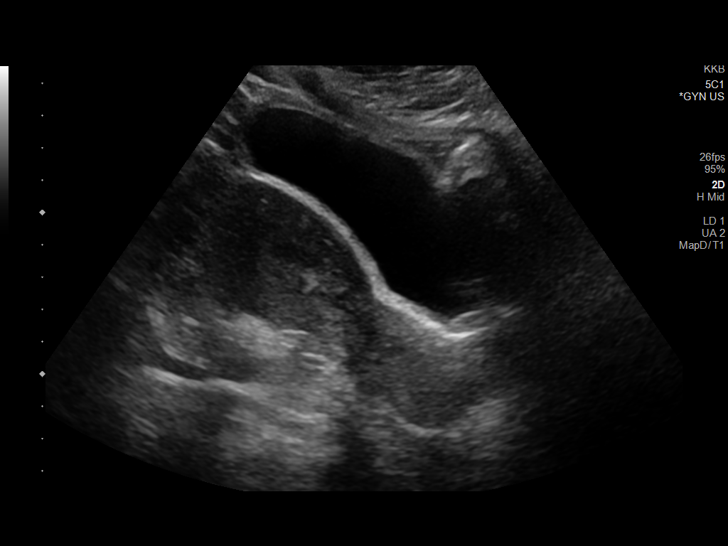
[im 5/51]
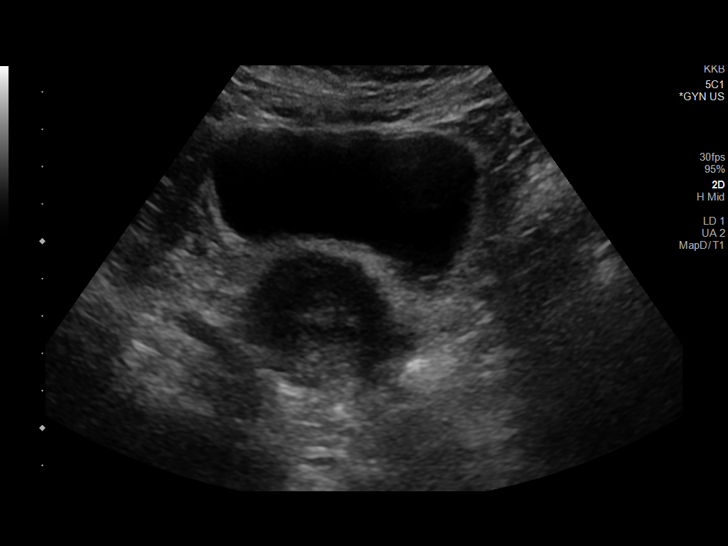
[im 9/51]
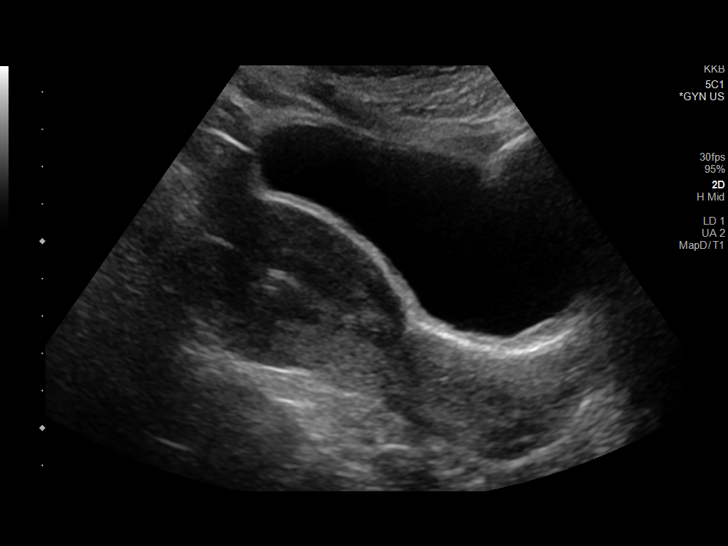
[im 13/51]
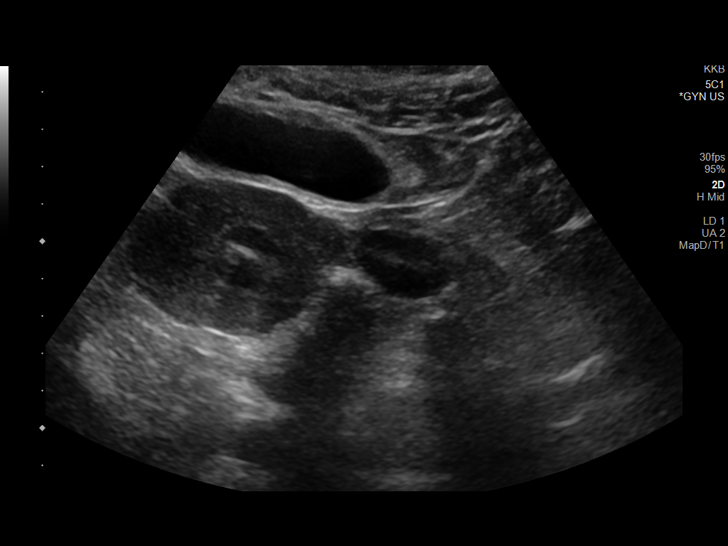
[im 17/51]
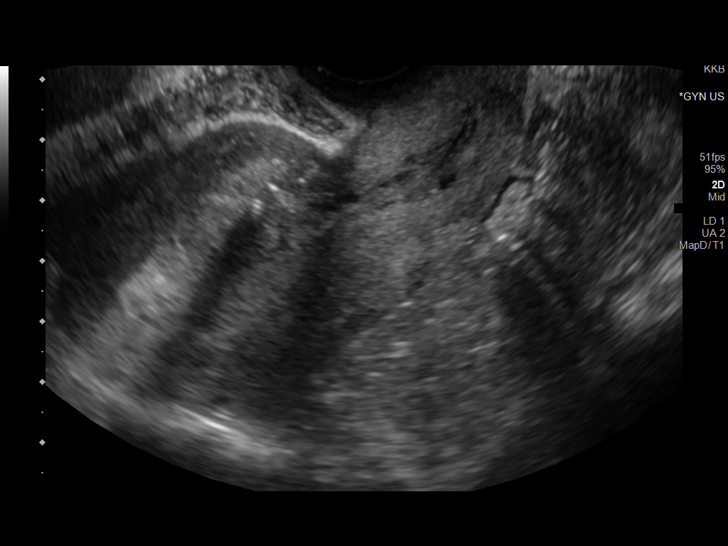
[im 21/51]
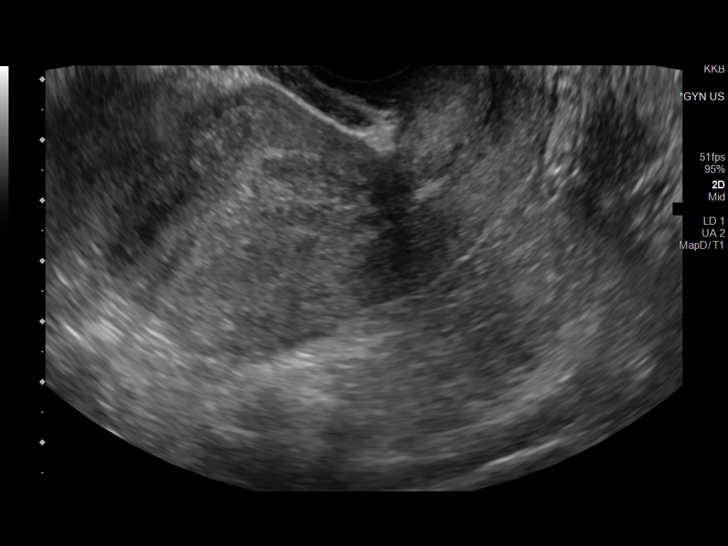
[im 26/51]
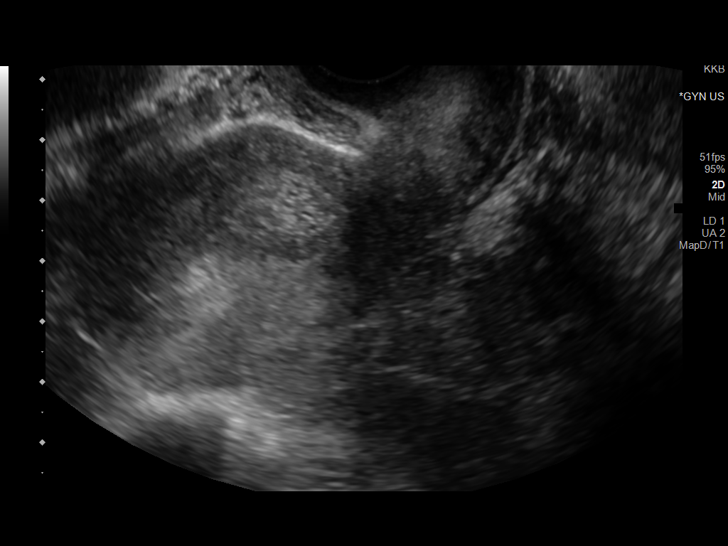
[im 30/51]
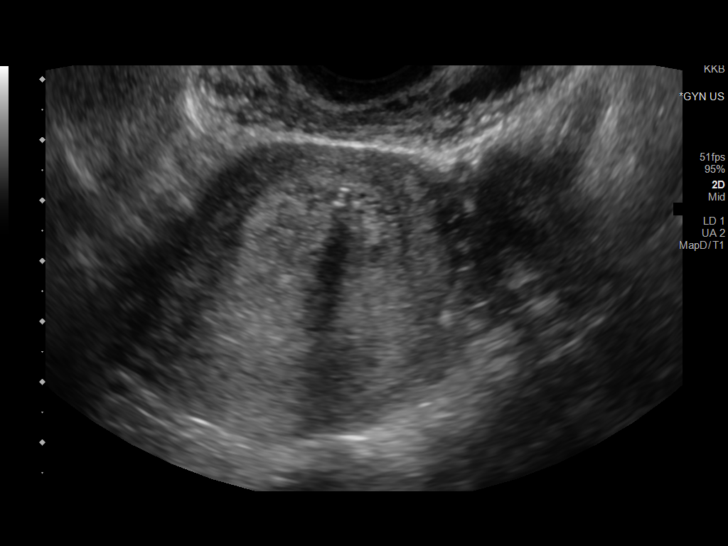
[im 34/51]
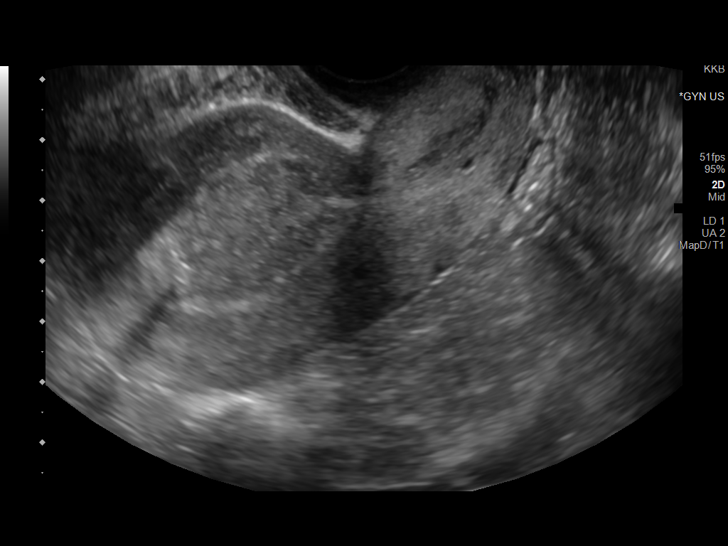
[im 38/51]
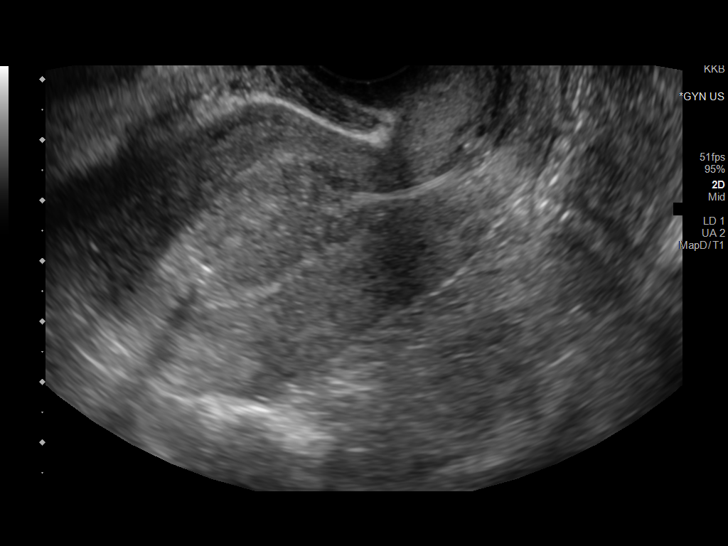
[im 42/51]
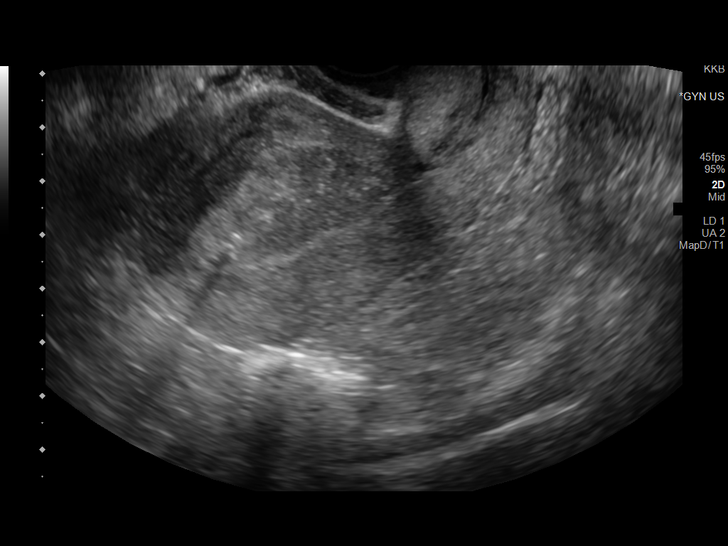
[im 46/51]
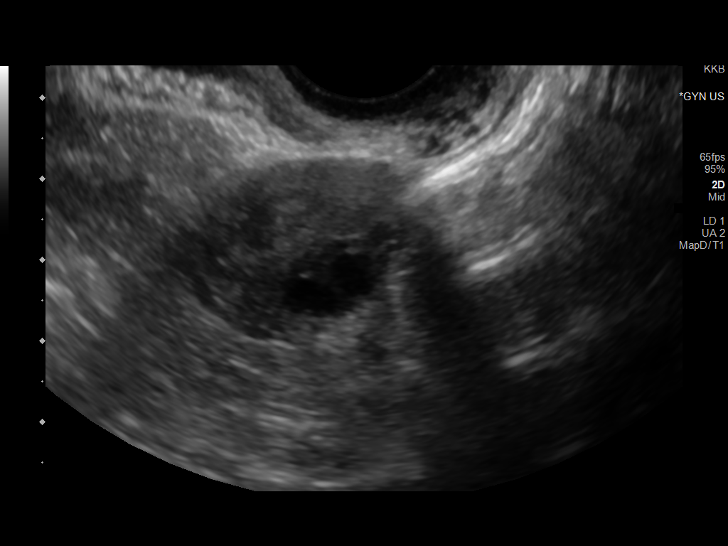
[im 51/51]
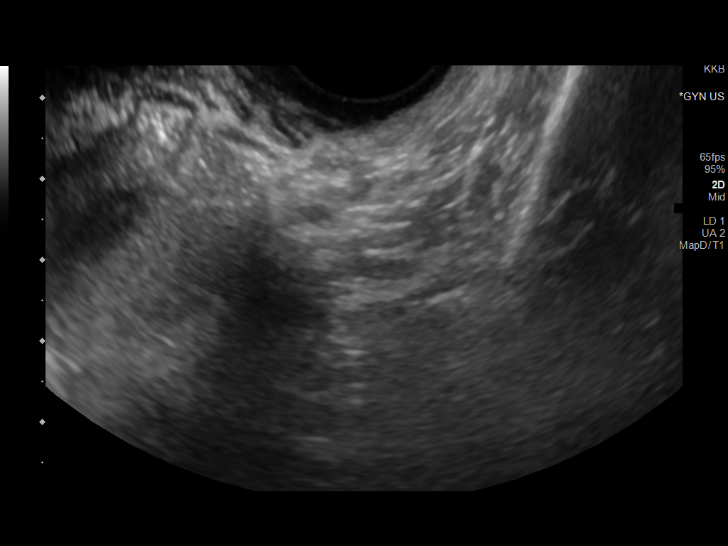

[13 of 25 positions shown; findings below may reference images not displayed]

FINDINGS: Uterus

Measurements: 8.8 x 4.7 x 4.9 cm = volume: 104.6 mL. Uterus is
anteverted. No discrete fibroid or other mass.

Endometrium

Thickness: 23 mm. No focal abnormality visualized. IUD in
appropriate position within the endometrial cavity at the level of
the uterine body/fundus.

Right ovary

Not visualized.  No adnexal mass.

Left ovary

Measurements: 3.0 x 2.3 x 1.9 cm = volume: 6.8 mL. Normal
appearance/no adnexal mass.

Other findings

No abnormal free fluid.
IMPRESSION: 1. IUD in appropriate position within the endometrial cavity.
2. Thickened endometrial stripe measuring up to 23 mm. Endometrial
thickness is considered abnormal. Consider follow-up by US in 6-8
weeks, during the week immediately following menses (exam timing is
critical).
3. Normal left ovary, with nonvisualization of the right ovary. No
adnexal mass or free fluid. No other acute abnormality.

## 2022-08-28 ENCOUNTER — Other Ambulatory Visit: Payer: BC Managed Care – PPO

## 2022-09-04 ENCOUNTER — Telehealth: Payer: Self-pay | Admitting: Family Medicine

## 2022-09-04 ENCOUNTER — Other Ambulatory Visit (INDEPENDENT_AMBULATORY_CARE_PROVIDER_SITE_OTHER): Payer: BC Managed Care – PPO

## 2022-09-04 ENCOUNTER — Other Ambulatory Visit: Payer: Self-pay | Admitting: Family Medicine

## 2022-09-04 DIAGNOSIS — L720 Epidermal cyst: Secondary | ICD-10-CM

## 2022-09-04 NOTE — Telephone Encounter (Signed)
Patient states she has a cyst on the middle of her neck. She would like to get a referral. Please advise.

## 2022-09-04 NOTE — Addendum Note (Signed)
Addended by: Mervin Kung A on: 09/04/2022 02:48 PM   Modules accepted: Orders

## 2022-09-05 LAB — COMPREHENSIVE METABOLIC PANEL
AG Ratio: 1.2 (calc) (ref 1.0–2.5)
ALT: 19 U/L (ref 6–29)
AST: 20 U/L (ref 10–30)
Albumin: 4 g/dL (ref 3.6–5.1)
Alkaline phosphatase (APISO): 76 U/L (ref 31–125)
BUN: 7 mg/dL (ref 7–25)
CO2: 25 mmol/L (ref 20–32)
Calcium: 8.8 mg/dL (ref 8.6–10.2)
Chloride: 104 mmol/L (ref 98–110)
Creat: 0.91 mg/dL (ref 0.50–0.97)
Globulin: 3.3 g/dL (calc) (ref 1.9–3.7)
Glucose, Bld: 122 mg/dL — ABNORMAL HIGH (ref 65–99)
Potassium: 3.8 mmol/L (ref 3.5–5.3)
Sodium: 139 mmol/L (ref 135–146)
Total Bilirubin: 0.3 mg/dL (ref 0.2–1.2)
Total Protein: 7.3 g/dL (ref 6.1–8.1)

## 2022-09-05 LAB — LIPID PANEL
Cholesterol: 158 mg/dL (ref <200)
HDL: 34 mg/dL — ABNORMAL LOW (ref >=50)
LDL Cholesterol (Calc): 102 mg/dL (calc) — ABNORMAL HIGH
Non-HDL Cholesterol (Calc): 124 mg/dL (calc) (ref <130)
Total CHOL/HDL Ratio: 4.6 (calc) (ref <5.0)
Triglycerides: 123 mg/dL (ref <150)

## 2022-09-05 LAB — HEMOGLOBIN A1C
Hgb A1c MFr Bld: 6.5 % of total Hgb — ABNORMAL HIGH (ref <5.7)
Mean Plasma Glucose: 140 mg/dL
eAG (mmol/L): 7.7 mmol/L

## 2022-09-10 ENCOUNTER — Other Ambulatory Visit: Payer: Self-pay | Admitting: *Deleted

## 2022-09-10 DIAGNOSIS — E119 Type 2 diabetes mellitus without complications: Secondary | ICD-10-CM

## 2022-09-10 DIAGNOSIS — E7800 Pure hypercholesterolemia, unspecified: Secondary | ICD-10-CM

## 2022-10-22 ENCOUNTER — Other Ambulatory Visit: Payer: Self-pay | Admitting: General Surgery

## 2022-11-09 ENCOUNTER — Ambulatory Visit (INDEPENDENT_AMBULATORY_CARE_PROVIDER_SITE_OTHER): Payer: BC Managed Care – PPO | Admitting: Family Medicine

## 2022-11-09 ENCOUNTER — Encounter: Payer: Self-pay | Admitting: Family Medicine

## 2022-11-09 VITALS — BP 136/88 | HR 82 | Temp 98.6°F | Resp 18 | Ht 60.0 in | Wt 239.4 lb

## 2022-11-09 DIAGNOSIS — L0211 Cutaneous abscess of neck: Secondary | ICD-10-CM

## 2022-11-09 MED ORDER — DOXYCYCLINE HYCLATE 100 MG PO TABS: 100.0000 mg | ORAL_TABLET | Freq: Two times a day (BID) | ORAL | 0 refills | Status: DC

## 2022-11-09 NOTE — Progress Notes (Signed)
Established Patient Office Visit  Subjective   Patient ID: Cheryl Mullen, female    DOB: [birth year]  Age: 32 y.o. MRN: [id number]  Chief Complaint  Patient presents with   Rash    Pt states having irritation at the back of the neck. Surgery was on 10/22/22.     HPI Discussed the use of AI scribe software for clinical note transcription with the patient, who gave verbal consent to proceed.  History of Present Illness   The patient, with a history of a recent surgical excision of a neck abscess, presents with concerns about wound healing. She reports an accidental burn during the procedure, which was only disclosed at the post-operative appointment. The patient has been advised to keep the wound covered, but she is allergic to the adhesive in Band-Aids and paper tape, causing skin irritation. Despite applying SSD cream for the burn and Neosporin for the wound, the patient reports that the wound is not healing correctly. She has been taking daily pictures of the wound for monitoring. The patient also reports green discharge from the wound since a week after the surgery.      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   IDA (iron deficiency anemia) 07/19/2015   Menorrhagia with irregular cycle 07/19/2015   Panic 05/31/2015   Obesity (BMI 30-39.9) 05/17/2013   Eczema 05/17/2013   Discoloration of skin 01/17/2013   Past Medical History:  Diagnosis Date   Allergy    Anemia    Eczema    Gestational diabetes    Past Surgical History:  Procedure Laterality  Date   CESAREAN SECTION N/A 06/07/2017   Procedure: CESAREAN SECTION;  Surgeon: Tereso Newcomer, MD;  Location: [facility];  Service: Obstetrics;  Laterality: N/A;   TONSILLECTOMY AND ADENOIDECTOMY Bilateral 01/02/2013   WISDOM TOOTH EXTRACTION     Social History   Tobacco Use   Smoking status: Never   Smokeless tobacco: Never  Vaping Use   Vaping status: Never Used  Substance Use Topics   Alcohol use: Yes    Alcohol/week: 0.0 standard drinks of alcohol   Drug use: No   Social History   Socioeconomic History   Marital status: Married    Spouse name: Not on file   Number of children: Not on file   Years of education: Not on file   Highest education level: Some college, no degree  Occupational History   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  Tobacco Use   Smoking status: Never   Smokeless tobacco: Never  Vaping Use   Vaping status: Never Used  Substance and Sexual Activity   Alcohol use: Yes    Alcohol/week: 0.0 standard drinks of alcohol   Drug use: No   Sexual activity: Yes    Partners: Male  Other Topics Concern   Not on file  Social History Narrative   Exercise--- NO   Social Determinants of Health   Financial Resource Strain: Medium Risk (11/08/2022)   Overall Financial Resource Strain (CARDIA)    Difficulty of Paying Living Expenses: Somewhat hard  Food Insecurity: No Food Insecurity (  11/08/2022)   Hunger Vital Sign    Worried About Running Out of Food in the Last Year: Never true    Ran Out of Food in the Last Year: Never true  Transportation Needs: No Transportation Needs (11/08/2022)   PRAPARE - Administrator, Civil Service (Medical): No    Lack of Transportation (Non-Medical): No  Physical Activity: Insufficiently Active (11/08/2022)   Exercise Vital Sign    Days of Exercise per Week: 4 days    Minutes of Exercise per Session: 20 min  Stress: No Stress Concern Present (11/08/2022)   [employer] of Occupational Health - Occupational Stress  Questionnaire    Feeling of Stress : Not at all  Social Connections: Socially Integrated (11/08/2022)   Social Connection and Isolation Panel [NHANES]    Frequency of Communication with Friends and Family: More than three times a week    Frequency of Social Gatherings with Friends and Family: Three times a week    Attends Religious Services: More than 4 times per year    Active Member of Clubs or Organizations: Yes    Attends Engineer, structural: More than 4 times per year    Marital Status: Married  Catering manager Violence: Not on file   Family Status  Relation Name Status   Mother  Alive   Father  Alive   MGM  Alive   MGF  Alive   PGM  Deceased at age 64       PE   PGF  Deceased at age 96       GSW   Neg Hx  (Not Specified)  No partnership data on file   Family History  Problem Relation Age of Onset   Hypertension Mother    Hyperlipidemia Mother    Hypertension Father    Hyperlipidemia Father    Hypertension Maternal Grandmother    Hypertension Maternal Grandfather    Diabetes Maternal Grandfather    Hypertension Paternal Grandmother    Hypertension Paternal Grandfather    Cancer Neg Hx    Allergies  Allergen Reactions   Prednisone Other (See Comments)    Hear rate speeds up   Nickel Hives      Review of Systems  Constitutional:  Negative for fever and malaise/fatigue.  HENT:  Negative for congestion.   Eyes:  Negative for blurred vision.  Respiratory:  Negative for cough and shortness of breath.   Cardiovascular:  Negative for chest pain, palpitations and leg swelling.  Gastrointestinal:  Negative for vomiting.  Musculoskeletal:  Negative for back pain.  Skin:  Positive for itching and rash.  Neurological:  Negative for loss of consciousness and headaches.      Objective:     BP 136/88 (BP Location: Left Arm, Patient Position: Sitting, Cuff Size: Large)   Pulse 82   Temp 98.6 F (37 C) (Oral)   Resp 18   Ht 5' (1.524 m)   Wt 239 lb 6.4  oz (108.6 kg)   SpO2 97%   BMI 46.75 kg/m  BP Readings from Last 3 Encounters:  11/09/22 136/88  07/24/22 126/76  07/17/22 (!) 140/79   Wt Readings from Last 3 Encounters:  11/09/22 239 lb 6.4 oz (108.6 kg)  07/09/22 236 lb (107 kg)  07/06/22 239 lb (108.4 kg)   SpO2 Readings from Last 3 Encounters:  11/09/22 97%  07/24/22 100%  07/17/22 100%      Physical Exam Vitals and nursing note reviewed.  Constitutional:  General: She is not in acute distress.    Appearance: Normal appearance. She is well-developed.  HENT:     Head: Normocephalic and atraumatic.  Eyes:     General: No scleral icterus.       Right eye: No discharge.        Left eye: No discharge.  Cardiovascular:     Rate and Rhythm: Normal rate and regular rhythm.     Heart sounds: No murmur heard. Pulmonary:     Effort: Pulmonary effort is normal. No respiratory distress.     Breath sounds: Normal breath sounds.  Musculoskeletal:        General: Normal range of motion.     Cervical back: Normal range of motion and neck supple.     Right lower leg: No edema.     Left lower leg: No edema.  Skin:    General: Skin is warm and dry.     Findings: Erythema, lesion and rash present. Rash is papular.          Comments: Wound open and draining green exudate With surrounding rash   Neurological:     Mental Status: She is alert and oriented to person, place, and time.  Psychiatric:        Mood and Affect: Mood normal.        Behavior: Behavior normal.        Thought Content: Thought content normal.        Judgment: Judgment normal.     No results found for any visits on 11/09/22.  Last CBC Lab Results  Component Value Date   WBC 6.6 07/06/2022   HGB 8.3 (L) 07/06/2022   HCT 25.9 (L) 07/06/2022   MCV 80.4 07/06/2022   MCH 25.8 (L) 07/06/2022   RDW 13.9 07/06/2022   PLT 307 07/06/2022   Last metabolic panel Lab Results  Component Value Date   GLUCOSE 122 (H) 09/04/2022   NA 139 09/04/2022    K 3.8 09/04/2022   CL 104 09/04/2022   CO2 25 09/04/2022   BUN 7 09/04/2022   CREATININE 0.91 09/04/2022   GFR 76.03 05/25/2022   CALCIUM 8.8 09/04/2022   PROT 7.3 09/04/2022   ALBUMIN 4.3 05/25/2022   BILITOT 0.3 09/04/2022   ALKPHOS 77 05/25/2022   AST 20 09/04/2022   ALT 19 09/04/2022   ANIONGAP 9 04/28/2022   Last lipids Lab Results  Component Value Date   CHOL 158 09/04/2022   HDL 34 (L) 09/04/2022   LDLCALC 102 (H) 09/04/2022   TRIG 123 09/04/2022   CHOLHDL 4.6 09/04/2022   Last hemoglobin A1c Lab Results  Component Value Date   HGBA1C 6.5 (H) 09/04/2022   Last thyroid functions Lab Results  Component Value Date   TSH 1.44 05/25/2022   Last vitamin D Lab Results  Component Value Date   VD25OH 20.31 (L) 09/14/2019   Last vitamin B12 and Folate Lab Results  Component Value Date   VITAMINB12 228 09/14/2019      The ASCVD Risk score (Arnett DK, et al., 2019) failed to calculate for the following reasons:   The 2019 ASCVD risk score is only valid for ages 74 to 79    Assessment & Plan:   Problem List Items Addressed This Visit   None Visit Diagnoses     Abscess, neck    -  Primary   Relevant Medications   doxycycline (VIBRA-TABS) 100 MG tablet   Other Relevant Orders   Wound culture  Assessment and Plan    Post-operative wound infection Wound on the back of the neck not healing properly after surgery. Green discharge noted. Patient reports pain and burning sensation. Allergic reaction to adhesive noted. Wound culture taken to identify causative organism. -Start antibiotics as prescribed. -Continue wound care with peroxide and pat dry. -Apply hydrocortisone cream to the surrounding skin to manage irritation from adhesive. -Follow up on wound culture results to adjust antibiotic therapy if necessary.  Accidental burn during surgery Patient reports being accidentally burnt during surgery. Treated with SSD (silver sulfadiazine)  cream. -Continue SSD cream as prescribed. -Consider alternative wound dressing options to avoid further irritation from adhesive.  Surgical excision of neck mass Mass was excised and sent for pathology. Results pending. -Follow up on pathology results.        No follow-ups on file.    Donato Schultz, DO

## 2023-01-05 ENCOUNTER — Ambulatory Visit: Payer: BC Managed Care – PPO | Admitting: Family

## 2023-01-05 ENCOUNTER — Inpatient Hospital Stay: Payer: BC Managed Care – PPO

## 2023-01-10 ENCOUNTER — Other Ambulatory Visit: Payer: Self-pay | Admitting: Family Medicine

## 2023-01-10 DIAGNOSIS — J302 Other seasonal allergic rhinitis: Secondary | ICD-10-CM

## 2023-01-10 DIAGNOSIS — E119 Type 2 diabetes mellitus without complications: Secondary | ICD-10-CM

## 2023-01-11 ENCOUNTER — Other Ambulatory Visit: Payer: Self-pay

## 2023-01-11 ENCOUNTER — Encounter: Payer: Self-pay | Admitting: Family Medicine

## 2023-01-11 ENCOUNTER — Inpatient Hospital Stay: Payer: BC Managed Care – PPO | Admitting: Medical Oncology

## 2023-01-11 ENCOUNTER — Inpatient Hospital Stay: Payer: BC Managed Care – PPO | Attending: Family

## 2023-01-11 ENCOUNTER — Encounter: Payer: Self-pay | Admitting: Medical Oncology

## 2023-01-11 VITALS — BP 143/98 | HR 88 | Temp 98.2°F | Resp 19 | Ht 60.0 in | Wt 237.0 lb

## 2023-01-11 DIAGNOSIS — D500 Iron deficiency anemia secondary to blood loss (chronic): Secondary | ICD-10-CM | POA: Diagnosis present

## 2023-01-11 DIAGNOSIS — N921 Excessive and frequent menstruation with irregular cycle: Secondary | ICD-10-CM | POA: Insufficient documentation

## 2023-01-11 DIAGNOSIS — D573 Sickle-cell trait: Secondary | ICD-10-CM

## 2023-01-11 LAB — CBC WITH DIFFERENTIAL (CANCER CENTER ONLY)
Abs Immature Granulocytes: 0.02 10*3/uL (ref 0.00–0.07)
Basophils Absolute: 0 10*3/uL (ref 0.0–0.1)
Basophils Relative: 0 %
Eosinophils Absolute: 0.1 10*3/uL (ref 0.0–0.5)
Eosinophils Relative: 2 %
HCT: 31.3 % — ABNORMAL LOW (ref 36.0–46.0)
Hemoglobin: 9.6 g/dL — ABNORMAL LOW (ref 12.0–15.0)
Immature Granulocytes: 0 %
Lymphocytes Relative: 42 %
Lymphs Abs: 3 10*3/uL (ref 0.7–4.0)
MCH: 20.6 pg — ABNORMAL LOW (ref 26.0–34.0)
MCHC: 30.7 g/dL (ref 30.0–36.0)
MCV: 67 fL — ABNORMAL LOW (ref 80.0–100.0)
Monocytes Absolute: 0.4 10*3/uL (ref 0.1–1.0)
Monocytes Relative: 5 %
Neutro Abs: 3.6 10*3/uL (ref 1.7–7.7)
Neutrophils Relative %: 51 %
Platelet Count: 328 10*3/uL (ref 150–400)
RBC: 4.67 MIL/uL (ref 3.87–5.11)
RDW: 17.9 % — ABNORMAL HIGH (ref 11.5–15.5)
WBC Count: 7.1 10*3/uL (ref 4.0–10.5)
nRBC: 0 % (ref 0.0–0.2)

## 2023-01-11 LAB — RETICULOCYTES
Immature Retic Fract: 16.5 % — ABNORMAL HIGH (ref 2.3–15.9)
RBC.: 4.57 MIL/uL (ref 3.87–5.11)
Retic Count, Absolute: 67.6 10*3/uL (ref 19.0–186.0)
Retic Ct Pct: 1.5 % (ref 0.4–3.1)

## 2023-01-11 LAB — IRON AND IRON BINDING CAPACITY (CC-WL,HP ONLY)
Iron: 18 ug/dL — ABNORMAL LOW (ref 28–170)
Saturation Ratios: 5 % — ABNORMAL LOW (ref 10.4–31.8)
TIBC: 358 ug/dL (ref 250–450)
UIBC: 340 ug/dL (ref 148–442)

## 2023-01-11 LAB — FERRITIN: Ferritin: 11 ng/mL (ref 11–307)

## 2023-01-11 NOTE — Progress Notes (Signed)
Hematology and Oncology Follow Up Visit  Cheryl Mullen [id number] 02-Aug-1990 32 y.o. 01/11/2023   Principle Diagnosis:  Iron deficiency anemia secondary to menorrhagia    Current Therapy:        IV iron as indicated- Ferrlecit- 07/24/2022   Interim History:  Cheryl Mullen is here today  for follow up.   Today she reports that she is well.   Menstrual cycles as shown below.  March- 21 days- heavy April- 14 days- heavy May/June/July - 4 days - heavy August- absent Sept- 5 days - normal to light  Not currently pregnant but considering.  No other blood loss noted. No bruising or petechiae.  No fever, chills, n/v, cough, rash, dizziness, SOB, chest pain, palpitations, abdominal pain or changes in bowel or bladder habits.  No swelling, tenderness, numbness or tingling in her extremities.  No falls or syncope reported.  Wt Readings from Last 3 Encounters:  01/11/23 237 lb (107.5 kg)  11/09/22 239 lb 6.4 oz (108.6 kg)  07/09/22 236 lb (107 kg)   ECOG Performance Status: 1 - Symptomatic but completely ambulatory  Medications:  Allergies as of 01/11/2023       Reactions   Prednisone Other (See Comments)   Hear rate speeds up   Nickel Hives        Medication List        Accurate as of January 11, 2023 11:32 AM. If you have any questions, ask your nurse or doctor.          doxycycline 100 MG tablet Commonly known as: VIBRA-TABS Take 1 tablet (100 mg total) by mouth 2 (two) times daily.   folic acid 1 MG tablet Commonly known as: FOLVITE Take 1 tablet (1 mg total) by mouth daily.   levocetirizine 5 MG tablet Commonly known as: Xyzal Take 1 tablet (5 mg total) by mouth every evening.   montelukast 10 MG tablet Commonly known as: SINGULAIR Take 1 tablet (10 mg total) by mouth at bedtime.   norethindrone 5 MG tablet Commonly known as: AYGESTIN Take 1 tablet (5 mg total) by mouth daily.   norgestimate-ethinyl estradiol 0.25-35 MG-MCG tablet Commonly known as:  ORTHO-CYCLEN Take 1 tablet by mouth daily.   ondansetron 4 MG disintegrating tablet Commonly known as: ZOFRAN-ODT Take 1 tablet (4 mg total) by mouth every 6 (six) hours as needed for nausea.   Ozempic (0.25 or 0.5 MG/DOSE) 2 MG/3ML Sopn Generic drug: Semaglutide(0.25 or 0.5MG /DOS) O.25 mg sq weekly x1 month then increase to 0.5 mg sq weekly Recheck labs 3 months   SSD 1 % cream Generic drug: silver sulfADIAZINE APPLY CREAM EXTERNALLY TWICE DAILY   triamcinolone ointment 0.1 % Commonly known as: KENALOG Apply 1 Application topically 2 (two) times daily.        Allergies:  Allergies  Allergen Reactions   Prednisone Other (See Comments)    Hear rate speeds up   Nickel Hives    Past Medical History, Surgical history, Social history, and Family History were reviewed and updated.  Review of Systems: All other 10 point review of systems is negative.   Physical Exam:  height is 5' (1.524 m) and weight is 237 lb (107.5 kg). Her oral temperature is 98.2 F (36.8 C). Her blood pressure is 143/98 (abnormal) and her pulse is 88. Her respiration is 19 and oxygen saturation is 100%.   Wt Readings from Last 3 Encounters:  01/11/23 237 lb (107.5 kg)  11/09/22 239 lb 6.4 oz (108.6 kg)  07/09/22 236  lb (107 kg)    Ocular: Sclerae unicteric, pupils equal, round and reactive to light Ear-nose-throat: Oropharynx clear, dentition fair Lymphatic: No cervical or supraclavicular adenopathy Lungs no rales or rhonchi, good excursion bilaterally Heart regular rate and rhythm, no murmur appreciated Abd soft, nontender, positive bowel sounds MSK no focal spinal tenderness, no joint edema Neuro: non-focal, well-oriented, appropriate affect   Lab Results  Component Value Date   WBC 7.1 01/11/2023   HGB 9.6 (L) 01/11/2023   HCT 31.3 (L) 01/11/2023   MCV 67.0 (L) 01/11/2023   PLT 328 01/11/2023   Lab Results  Component Value Date   FERRITIN 16 07/06/2022   IRON 34 07/06/2022   TIBC  414 07/06/2022   UIBC 380 07/06/2022   IRONPCTSAT 8 (L) 07/06/2022   Lab Results  Component Value Date   RETICCTPCT 1.5 01/11/2023   RBC 4.57 01/11/2023   RBC 4.67 01/11/2023   No results found for: "KPAFRELGTCHN", "LAMBDASER", "KAPLAMBRATIO" No results found for: "IGGSERUM", "IGA", "IGMSERUM" No results found for: "TOTALPROTELP", "ALBUMINELP", "A1GS", "A2GS", "BETS", "BETA2SER", "GAMS", "MSPIKE", "SPEI"   Chemistry      Component Value Date/Time   NA 139 09/04/2022 1448   NA 142 06/18/2017 1140   K 3.8 09/04/2022 1448   CL 104 09/04/2022 1448   CO2 25 09/04/2022 1448   BUN 7 09/04/2022 1448   BUN 22 (H) 06/18/2017 1140   CREATININE 0.91 09/04/2022 1448      Component Value Date/Time   CALCIUM 8.8 09/04/2022 1448   ALKPHOS 77 05/25/2022 1335   AST 20 09/04/2022 1448   ALT 19 09/04/2022 1448   BILITOT 0.3 09/04/2022 1448      Encounter Diagnoses  Name Primary?   Iron deficiency anemia due to chronic blood loss Yes   Menorrhagia with irregular cycle    Sickle cell trait (HCC)     Impression and Plan: Cheryl Mullen is a very pleasant 32 yo African American female with iron deficiency anemia secondary to heavy cycles. Complicated by sickle cell trait.   Today Hgb is up to 9.6 from 8.3 6 months ago. MCV is 67.  Iron studies are pending. Likely will need IV iron. Reviewed that Venofer would be the preferred iron should she become pregnant. Not to be given in the 1st trimester.  RTC 3 months APP, labs   Rushie Chestnut, [place] 10/7/202411:32 AM

## 2023-01-18 ENCOUNTER — Inpatient Hospital Stay: Payer: BC Managed Care – PPO

## 2023-01-18 VITALS — BP 140/82 | HR 85 | Temp 98.3°F | Resp 18

## 2023-01-18 DIAGNOSIS — D500 Iron deficiency anemia secondary to blood loss (chronic): Secondary | ICD-10-CM

## 2023-01-18 DIAGNOSIS — N921 Excessive and frequent menstruation with irregular cycle: Secondary | ICD-10-CM

## 2023-01-18 MED ORDER — SODIUM CHLORIDE 0.9 % IV SOLN
125.0000 mg | Freq: Once | INTRAVENOUS | Status: AC
  Administered 2023-01-18: 125 mg via INTRAVENOUS
  Filled 2023-01-18: qty 10

## 2023-01-18 MED ORDER — SODIUM CHLORIDE 0.9 % IV SOLN: Freq: Once | INTRAVENOUS | Status: AC

## 2023-01-18 NOTE — Patient Instructions (Signed)
Sodium Ferric Gluconate Complex Injection What is this medication? SODIUM FERRIC GLUCONATE COMPLEX (SOE dee um FER ik GLOO koe nate KOM pleks) treats low levels of iron (iron deficiency anemia) in people with kidney disease. Iron is a mineral that plays an important role in making red blood cells, which carry oxygen from your lungs to the rest of your body. This medicine may be used for other purposes; ask your health care provider or pharmacist if you have questions. COMMON BRAND NAME(S): Ferrlecit, Nulecit What should I tell my care team before I take this medication? They need to know if you have any of the following conditions: Anemia that is not from iron deficiency High levels of iron in the blood An unusual or allergic reaction to iron, other medications, foods, dyes, or preservatives Pregnant or are trying to become pregnant Breast-feeding How should I use this medication? This medication is injected into a vein. It is given by your care team in a hospital or clinic setting. Talk to your care team about the use of this medication in children. While it may be prescribed for children as young as 6 years for selected conditions, precautions do apply. Overdosage: If you think you have taken too much of this medicine contact a poison control center or emergency room at once. NOTE: This medicine is only for you. Do not share this medicine with others. What if I miss a dose? It is important not to miss your dose. Call your care team if you are unable to keep an appointment. What may interact with this medication? Do not take this medication with any of the following: Deferasirox Deferoxamine Dimercaprol This medication may also interact with the following: Other iron products This list may not describe all possible interactions. Give your health care provider a list of all the medicines, herbs, non-prescription drugs, or dietary supplements you use. Also tell them if you smoke, drink  alcohol, or use illegal drugs. Some items may interact with your medicine. What should I watch for while using this medication? Your condition will be monitored carefully while you are receiving this medication. Visit your care team for regular checks on your progress. You may need blood work while you are taking this medication. What side effects may I notice from receiving this medication? Side effects that you should report to your care team as soon as possible: Allergic reactions--skin rash, itching, hives, swelling of the face, lips, tongue, or throat Low blood pressure--dizziness, feeling faint or lightheaded, blurry vision Shortness of breath Side effects that usually do not require medical attention (report to your care team if they continue or are bothersome): Flushing Headache Joint pain Muscle pain Nausea Pain, redness, or irritation at injection site This list may not describe all possible side effects. Call your doctor for medical advice about side effects. You may report side effects to FDA at 1-800-FDA-1088. Where should I keep my medication? This medication is given in a hospital or clinic and will not be stored at home. NOTE: This sheet is a summary. It may not cover all possible information. If you have questions about this medicine, talk to your doctor, pharmacist, or health care provider.  2024 Elsevier/Gold Standard (2020-08-16 00:00:00)

## 2023-01-25 ENCOUNTER — Inpatient Hospital Stay: Payer: BC Managed Care – PPO

## 2023-01-25 VITALS — BP 128/73 | HR 96 | Resp 18

## 2023-01-25 DIAGNOSIS — D500 Iron deficiency anemia secondary to blood loss (chronic): Secondary | ICD-10-CM

## 2023-01-25 DIAGNOSIS — N921 Excessive and frequent menstruation with irregular cycle: Secondary | ICD-10-CM

## 2023-01-25 MED ORDER — SODIUM CHLORIDE 0.9 % IV SOLN: Freq: Once | INTRAVENOUS | Status: AC

## 2023-01-25 MED ORDER — SODIUM CHLORIDE 0.9% FLUSH
3.0000 mL | Freq: Once | INTRAVENOUS | Status: DC | PRN
Start: 2023-01-25 — End: 2023-01-25

## 2023-01-25 MED ORDER — SODIUM CHLORIDE 0.9 % IV SOLN
125.0000 mg | Freq: Once | INTRAVENOUS | Status: AC
  Administered 2023-01-25: 125 mg via INTRAVENOUS
  Filled 2023-01-25: qty 10

## 2023-01-25 MED ORDER — SODIUM CHLORIDE 0.9% FLUSH
10.0000 mL | Freq: Once | INTRAVENOUS | Status: DC | PRN
Start: 2023-01-25 — End: 2023-01-25

## 2023-01-25 NOTE — Progress Notes (Signed)
Pt refused to stay for 30 minute post iron observation and is without complaints at time of discharge.

## 2023-02-07 ENCOUNTER — Encounter: Payer: Self-pay | Admitting: Family Medicine

## 2023-02-09 MED ORDER — SEMAGLUTIDE (1 MG/DOSE) 4 MG/3ML ~~LOC~~ SOPN: 1.0000 mg | PEN_INJECTOR | SUBCUTANEOUS | 0 refills | Status: DC

## 2023-04-11 ENCOUNTER — Other Ambulatory Visit: Payer: Self-pay | Admitting: Family Medicine

## 2023-04-11 DIAGNOSIS — J302 Other seasonal allergic rhinitis: Secondary | ICD-10-CM

## 2023-04-12 ENCOUNTER — Inpatient Hospital Stay: Payer: Managed Care, Other (non HMO) | Attending: Family

## 2023-04-12 ENCOUNTER — Encounter: Payer: Self-pay | Admitting: Medical Oncology

## 2023-04-12 ENCOUNTER — Encounter: Payer: Self-pay | Admitting: Family

## 2023-04-12 ENCOUNTER — Inpatient Hospital Stay (HOSPITAL_BASED_OUTPATIENT_CLINIC_OR_DEPARTMENT_OTHER): Payer: Managed Care, Other (non HMO) | Admitting: Medical Oncology

## 2023-04-12 VITALS — BP 137/87 | HR 78 | Temp 98.4°F | Resp 18 | Ht 60.0 in | Wt 244.0 lb

## 2023-04-12 DIAGNOSIS — N921 Excessive and frequent menstruation with irregular cycle: Secondary | ICD-10-CM | POA: Insufficient documentation

## 2023-04-12 DIAGNOSIS — D500 Iron deficiency anemia secondary to blood loss (chronic): Secondary | ICD-10-CM

## 2023-04-12 DIAGNOSIS — D573 Sickle-cell trait: Secondary | ICD-10-CM

## 2023-04-12 LAB — IRON AND IRON BINDING CAPACITY (CC-WL,HP ONLY)
Iron: 26 ug/dL — ABNORMAL LOW (ref 28–170)
Saturation Ratios: 6 % — ABNORMAL LOW (ref 10.4–31.8)
TIBC: 413 ug/dL (ref 250–450)
UIBC: 387 ug/dL (ref 148–442)

## 2023-04-12 LAB — CBC
HCT: 34.3 % — ABNORMAL LOW (ref 36.0–46.0)
Hemoglobin: 10.7 g/dL — ABNORMAL LOW (ref 12.0–15.0)
MCH: 22.7 pg — ABNORMAL LOW (ref 26.0–34.0)
MCHC: 31.2 g/dL (ref 30.0–36.0)
MCV: 72.8 fL — ABNORMAL LOW (ref 80.0–100.0)
Platelets: 329 10*3/uL (ref 150–400)
RBC: 4.71 MIL/uL (ref 3.87–5.11)
RDW: 18.1 % — ABNORMAL HIGH (ref 11.5–15.5)
WBC: 7 10*3/uL (ref 4.0–10.5)
nRBC: 0 % (ref 0.0–0.2)

## 2023-04-12 LAB — FERRITIN: Ferritin: 16 ng/mL (ref 11–307)

## 2023-04-12 LAB — RETICULOCYTES
Immature Retic Fract: 27.5 % — ABNORMAL HIGH (ref 2.3–15.9)
RBC.: 4.58 MIL/uL (ref 3.87–5.11)
Retic Count, Absolute: 87.9 10*3/uL (ref 19.0–186.0)
Retic Ct Pct: 1.9 % (ref 0.4–3.1)

## 2023-04-12 NOTE — Progress Notes (Signed)
 Hematology and Oncology Follow Up Visit  Cheryl Mullen [id number] 15-Aug-1990 32 y.o. 04/12/2023   Principle Diagnosis:  Iron  deficiency anemia secondary to menorrhagia    Current Therapy:        IV iron  as indicated- Ferrlecit- 01/25/2023   Interim History:  Cheryl Mullen is here today  for follow up.   Today she reports that she has been more fatigued for the past week or so.   Menstrual cycles as shown below.  March- 21 days- heavy April- 14 days- heavy May/June/July - 4 days - heavy August- absent Sept- 5 days - normal to light  Octs- 5 days- Heavy Nov- None Dec- 4 days- heavy Trying for pregnancy No other blood loss noted. No bruising or petechiae.  No fever, chills, n/v, cough, rash, dizziness, SOB, chest pain, palpitations, abdominal pain or changes in bowel or bladder habits.  No swelling, tenderness, numbness or tingling in her extremities.  No falls or syncope reported.  Wt Readings from Last 3 Encounters:  04/12/23 244 lb (110.7 kg)  01/11/23 237 lb (107.5 kg)  11/09/22 239 lb 6.4 oz (108.6 kg)   ECOG Performance Status: 1 - Symptomatic but completely ambulatory  Medications:  Allergies as of 04/12/2023       Reactions   Prednisone  Other (See Comments)   Hear rate speeds up   Nickel Hives        Medication List        Accurate as of April 12, 2023 12:03 PM. If you have any questions, ask your nurse or doctor.          STOP taking these medications    doxycycline  100 MG tablet Commonly known as: VIBRA -TABS Stopped by: Lauraine [name]   norethindrone  5 MG tablet Commonly known as: AYGESTIN  Stopped by: Lauraine [name]   norgestimate -ethinyl estradiol  0.25-35 MG-MCG tablet Commonly known as: ORTHO-CYCLEN Stopped by: Lauraine [name]   ondansetron  4 MG disintegrating tablet Commonly known as: ZOFRAN -ODT Stopped by: Lauraine [name]   SSD 1 % cream Generic drug: silver sulfADIAZINE Stopped by: Lauraine [name]       TAKE these  medications    folic acid  1 MG tablet Commonly known as: FOLVITE  Take 1 tablet (1 mg total) by mouth daily.   levocetirizine 5 MG tablet Commonly known as: Xyzal  Take 1 tablet (5 mg total) by mouth every evening.   montelukast  10 MG tablet Commonly known as: SINGULAIR  Take 1 tablet (10 mg total) by mouth at bedtime.   Semaglutide  (1 MG/DOSE) 4 MG/3ML Sopn Inject 1 mg into the skin once a week.   triamcinolone  ointment 0.1 % Commonly known as: KENALOG  Apply 1 Application topically 2 (two) times daily.        Allergies:  Allergies  Allergen Reactions   Prednisone  Other (See Comments)    Hear rate speeds up   Nickel Hives    Past Medical History, Surgical history, Social history, and Family History were reviewed and updated.  Review of Systems: All other 10 point review of systems is negative.   Physical Exam:  height is 5' (1.524 m) and weight is 244 lb (110.7 kg). Her oral temperature is 98.4 F (36.9 C). Her blood pressure is 137/87 and her pulse is 78. Her respiration is 18 and oxygen saturation is 100%.   Wt Readings from Last 3 Encounters:  04/12/23 244 lb (110.7 kg)  01/11/23 237 lb (107.5 kg)  11/09/22 239 lb 6.4 oz (108.6 kg)    Ocular:  Sclerae unicteric, pupils equal, round and reactive to light Ear-nose-throat: Oropharynx clear, dentition fair Lymphatic: No cervical or supraclavicular adenopathy Lungs no rales or rhonchi, good excursion bilaterally Heart regular rate and rhythm, no murmur appreciated Abd soft, nontender, positive bowel sounds MSK no focal spinal tenderness, no joint edema Neuro: non-focal, well-oriented, appropriate affect   Lab Results  Component Value Date   WBC 7.0 04/12/2023   HGB 10.7 (L) 04/12/2023   HCT 34.3 (L) 04/12/2023   MCV 72.8 (L) 04/12/2023   PLT 329 04/12/2023   Lab Results  Component Value Date   FERRITIN 11 01/11/2023   IRON  18 (L) 01/11/2023   TIBC 358 01/11/2023   UIBC 340 01/11/2023   IRONPCTSAT 5  (L) 01/11/2023   Lab Results  Component Value Date   RETICCTPCT 1.9 04/12/2023   RBC 4.58 04/12/2023   No results found for: KPAFRELGTCHN, LAMBDASER, KAPLAMBRATIO No results found for: IGGSERUM, IGA, IGMSERUM No results found for: STEPHANY CARLOTA BENSON MARKEL EARLA JOANNIE DOC VICK, SPEI   Chemistry      Component Value Date/Time   NA 139 09/04/2022 1448   NA 142 06/18/2017 1140   K 3.8 09/04/2022 1448   CL 104 09/04/2022 1448   CO2 25 09/04/2022 1448   BUN 7 09/04/2022 1448   BUN 22 (H) 06/18/2017 1140   CREATININE 0.91 09/04/2022 1448      Component Value Date/Time   CALCIUM  8.8 09/04/2022 1448   ALKPHOS 77 05/25/2022 1335   AST 20 09/04/2022 1448   ALT 19 09/04/2022 1448   BILITOT 0.3 09/04/2022 1448      Encounter Diagnoses  Name Primary?   Iron  deficiency anemia due to chronic blood loss Yes   Menorrhagia with irregular cycle    Sickle cell trait (HCC)     Impression and Plan: Cheryl Mullen is a very pleasant 33 yo African American female with iron  deficiency anemia secondary to heavy cycles. Complicated by sickle cell trait.   Today Hgb is 10.7. MCV is 72.8. Immature retic count is high Iron  studies are pending. Likely will need IV iron . Reviewed that Venofer  would be the preferred iron  should she become pregnant. Not to be given in the 1st trimester.  RTC 3 months APP, labs (CBC, iron , ferritin)  Lauraine [name], PA-C 1/6/202512:03 PM

## 2023-04-13 ENCOUNTER — Other Ambulatory Visit: Payer: Self-pay | Admitting: Medical Oncology

## 2023-04-15 ENCOUNTER — Other Ambulatory Visit: Payer: Self-pay | Admitting: Family

## 2023-04-20 ENCOUNTER — Telehealth: Payer: Self-pay

## 2023-04-20 ENCOUNTER — Inpatient Hospital Stay: Payer: Managed Care, Other (non HMO)

## 2023-04-20 ENCOUNTER — Encounter: Payer: Self-pay | Admitting: Family

## 2023-04-20 VITALS — BP 141/78 | HR 82 | Temp 98.2°F | Resp 17

## 2023-04-20 DIAGNOSIS — N921 Excessive and frequent menstruation with irregular cycle: Secondary | ICD-10-CM

## 2023-04-20 DIAGNOSIS — D500 Iron deficiency anemia secondary to blood loss (chronic): Secondary | ICD-10-CM

## 2023-04-20 MED ORDER — SODIUM CHLORIDE 0.9 % IV SOLN
300.0000 mg | Freq: Once | INTRAVENOUS | Status: AC
  Administered 2023-04-20: 300 mg via INTRAVENOUS
  Filled 2023-04-20: qty 300

## 2023-04-20 MED ORDER — SODIUM CHLORIDE 0.9 % IV SOLN: INTRAVENOUS | Status: DC

## 2023-04-20 NOTE — Patient Instructions (Signed)
 Iron Sucrose Injection What is this medication? IRON SUCROSE (EYE ern SOO krose) treats low levels of iron (iron deficiency anemia) in people with kidney disease. Iron is a mineral that plays an important role in making red blood cells, which carry oxygen from your lungs to the rest of your body. This medicine may be used for other purposes; ask your health care provider or pharmacist if you have questions. COMMON BRAND NAME(S): Venofer What should I tell my care team before I take this medication? They need to know if you have any of these conditions: Anemia not caused by low iron levels Heart disease High levels of iron in the blood Kidney disease Liver disease An unusual or allergic reaction to iron, other medications, foods, dyes, or preservatives Pregnant or trying to get pregnant Breastfeeding How should I use this medication? This medication is for infusion into a vein. It is given in a hospital or clinic setting. Talk to your care team about the use of this medication in children. While this medication may be prescribed for children as young as 2 years for selected conditions, precautions do apply. Overdosage: If you think you have taken too much of this medicine contact a poison control center or emergency room at once. NOTE: This medicine is only for you. Do not share this medicine with others. What if I miss a dose? Keep appointments for follow-up doses. It is important not to miss your dose. Call your care team if you are unable to keep an appointment. What may interact with this medication? Do not take this medication with any of the following: Deferoxamine Dimercaprol Other iron products This medication may also interact with the following: Chloramphenicol Deferasirox This list may not describe all possible interactions. Give your health care provider a list of all the medicines, herbs, non-prescription drugs, or dietary supplements you use. Also tell them if you smoke,  drink alcohol, or use illegal drugs. Some items may interact with your medicine. What should I watch for while using this medication? Visit your care team regularly. Tell your care team if your symptoms do not start to get better or if they get worse. You may need blood work done while you are taking this medication. You may need to follow a special diet. Talk to your care team. Foods that contain iron include: whole grains/cereals, dried fruits, beans, or peas, leafy green vegetables, and organ meats (liver, kidney). What side effects may I notice from receiving this medication? Side effects that you should report to your care team as soon as possible: Allergic reactions--skin rash, itching, hives, swelling of the face, lips, tongue, or throat Low blood pressure--dizziness, feeling faint or lightheaded, blurry vision Shortness of breath Side effects that usually do not require medical attention (report to your care team if they continue or are bothersome): Flushing Headache Joint pain Muscle pain Nausea Pain, redness, or irritation at injection site This list may not describe all possible side effects. Call your doctor for medical advice about side effects. You may report side effects to FDA at 1-800-FDA-1088. Where should I keep my medication? This medication is given in a hospital or clinic. It will not be stored at home. NOTE: This sheet is a summary. It may not cover all possible information. If you have questions about this medicine, talk to your doctor, pharmacist, or health care provider.  2024 Elsevier/Gold Standard (2022-08-28 00:00:00)

## 2023-04-20 NOTE — Telephone Encounter (Signed)
 Pt needs appointment if still taking Ozempic

## 2023-04-20 NOTE — Progress Notes (Signed)
 Patient does not want to stay for the 30 minutes recommended post IV iron observation. Patient VSS. Patient discharged ambulatory without complaints or concerns.

## 2023-04-20 NOTE — Progress Notes (Signed)
 Patient is to receive Venofer 300mg  x 3 doses per Cheryl Mullen.  Anola Gurney [place], [place], BCPS, BCOP 04/20/2023 11:39 AM

## 2023-04-20 NOTE — Telephone Encounter (Signed)
 Received PA for Ozempic- needs OV for diabetes management.

## 2023-04-21 NOTE — Telephone Encounter (Signed)
 Appt scheduled 04/27/23

## 2023-04-27 ENCOUNTER — Encounter: Payer: Self-pay | Admitting: Family Medicine

## 2023-04-27 ENCOUNTER — Inpatient Hospital Stay: Payer: Managed Care, Other (non HMO)

## 2023-04-27 ENCOUNTER — Ambulatory Visit: Payer: Managed Care, Other (non HMO)

## 2023-04-27 ENCOUNTER — Ambulatory Visit: Payer: Managed Care, Other (non HMO) | Admitting: Family Medicine

## 2023-04-27 ENCOUNTER — Other Ambulatory Visit: Payer: Self-pay | Admitting: Family Medicine

## 2023-04-27 ENCOUNTER — Telehealth: Payer: Self-pay

## 2023-04-27 VITALS — BP 129/83 | HR 91 | Temp 98.3°F | Resp 17

## 2023-04-27 VITALS — BP 114/80 | HR 78 | Temp 98.8°F | Resp 18 | Ht 60.0 in | Wt 243.6 lb

## 2023-04-27 DIAGNOSIS — D500 Iron deficiency anemia secondary to blood loss (chronic): Secondary | ICD-10-CM

## 2023-04-27 DIAGNOSIS — L309 Dermatitis, unspecified: Secondary | ICD-10-CM

## 2023-04-27 DIAGNOSIS — E119 Type 2 diabetes mellitus without complications: Secondary | ICD-10-CM | POA: Diagnosis not present

## 2023-04-27 DIAGNOSIS — D573 Sickle-cell trait: Secondary | ICD-10-CM

## 2023-04-27 DIAGNOSIS — N921 Excessive and frequent menstruation with irregular cycle: Secondary | ICD-10-CM

## 2023-04-27 DIAGNOSIS — J302 Other seasonal allergic rhinitis: Secondary | ICD-10-CM

## 2023-04-27 DIAGNOSIS — Z7985 Long-term (current) use of injectable non-insulin antidiabetic drugs: Secondary | ICD-10-CM

## 2023-04-27 DIAGNOSIS — N912 Amenorrhea, unspecified: Secondary | ICD-10-CM

## 2023-04-27 DIAGNOSIS — R21 Rash and other nonspecific skin eruption: Secondary | ICD-10-CM

## 2023-04-27 LAB — CBC WITH DIFFERENTIAL/PLATELET
Basophils Absolute: 0 10*3/uL (ref 0.0–0.1)
Basophils Relative: 0.4 % (ref 0.0–3.0)
Eosinophils Absolute: 0.1 10*3/uL (ref 0.0–0.7)
Eosinophils Relative: 1.8 % (ref 0.0–5.0)
HCT: 37.7 % (ref 36.0–46.0)
Hemoglobin: 12 g/dL (ref 12.0–15.0)
Lymphocytes Relative: 36.5 % (ref 12.0–46.0)
Lymphs Abs: 2.4 10*3/uL (ref 0.7–4.0)
MCHC: 31.8 g/dL (ref 30.0–36.0)
MCV: 74.5 fL — ABNORMAL LOW (ref 78.0–100.0)
Monocytes Absolute: 0.3 10*3/uL (ref 0.1–1.0)
Monocytes Relative: 4.9 % (ref 3.0–12.0)
Neutro Abs: 3.7 10*3/uL (ref 1.4–7.7)
Neutrophils Relative %: 56.4 % (ref 43.0–77.0)
Platelets: 296 10*3/uL (ref 150.0–400.0)
RBC: 5.06 Mil/uL (ref 3.87–5.11)
RDW: 19.8 % — ABNORMAL HIGH (ref 11.5–15.5)
WBC: 6.6 10*3/uL (ref 4.0–10.5)

## 2023-04-27 LAB — HEMOGLOBIN A1C: Hgb A1c MFr Bld: 7.1 % — ABNORMAL HIGH (ref 4.6–6.5)

## 2023-04-27 LAB — MICROALBUMIN / CREATININE URINE RATIO
Creatinine,U: 122.5 mg/dL
Microalb Creat Ratio: 4 mg/g (ref 0.0–30.0)
Microalb, Ur: 4.8 mg/dL — ABNORMAL HIGH (ref 0.0–1.9)

## 2023-04-27 LAB — COMPREHENSIVE METABOLIC PANEL
ALT: 28 U/L (ref 0–35)
AST: 22 U/L (ref 0–37)
Albumin: 4.4 g/dL (ref 3.5–5.2)
Alkaline Phosphatase: 70 U/L (ref 39–117)
BUN: 8 mg/dL (ref 6–23)
CO2: 26 meq/L (ref 19–32)
Calcium: 8.7 mg/dL (ref 8.4–10.5)
Chloride: 103 meq/L (ref 96–112)
Creatinine, Ser: 0.81 mg/dL (ref 0.40–1.20)
GFR: 96.1 mL/min (ref >60.00)
Glucose, Bld: 120 mg/dL — ABNORMAL HIGH (ref 70–99)
Potassium: 4 meq/L (ref 3.5–5.1)
Sodium: 137 meq/L (ref 135–145)
Total Bilirubin: 0.4 mg/dL (ref 0.2–1.2)
Total Protein: 7.8 g/dL (ref 6.0–8.3)

## 2023-04-27 LAB — LIPID PANEL
Cholesterol: 161 mg/dL (ref 0–200)
HDL: 33.5 mg/dL — ABNORMAL LOW (ref >39.00)
LDL Cholesterol: 110 mg/dL — ABNORMAL HIGH (ref 0–99)
NonHDL: 127.77
Total CHOL/HDL Ratio: 5
Triglycerides: 89 mg/dL (ref 0.0–149.0)
VLDL: 17.8 mg/dL (ref 0.0–40.0)

## 2023-04-27 MED ORDER — SODIUM CHLORIDE 0.9 % IV SOLN: INTRAVENOUS | Status: DC

## 2023-04-27 MED ORDER — MONTELUKAST SODIUM 10 MG PO TABS: 10.0000 mg | ORAL_TABLET | Freq: Every day | ORAL | 3 refills | Status: AC

## 2023-04-27 MED ORDER — DEXCOM G7 SENSOR MISC: 5 refills | Status: AC

## 2023-04-27 MED ORDER — TIRZEPATIDE 5 MG/0.5ML ~~LOC~~ SOAJ: 5.0000 mg | SUBCUTANEOUS | 2 refills | Status: DC

## 2023-04-27 MED ORDER — SODIUM CHLORIDE 0.9 % IV SOLN
300.0000 mg | Freq: Once | INTRAVENOUS | Status: AC
  Administered 2023-04-27: 300 mg via INTRAVENOUS
  Filled 2023-04-27: qty 300

## 2023-04-27 NOTE — Patient Instructions (Signed)

## 2023-04-27 NOTE — Telephone Encounter (Signed)
PA approved. Effective 04/27/2023 to 04/26/2026

## 2023-04-27 NOTE — Progress Notes (Signed)
Established Patient Office Visit  Subjective   Patient ID: Cheryl Mullen, female    DOB: 29-Oct-1990  Age: 33 y.o. MRN: [id number]  No chief complaint on file.   HPI Discussed the use of AI scribe software for clinical note transcription with the patient, who gave verbal consent to proceed.  History of Present Illness   The patient, with a history of diabetes and iron deficiency anemia, presents for a routine follow-up. She reports no significant changes in her health status since the last visit. She has been receiving iron infusions, which she reports as not yet significantly improving her symptoms. The patient's menstrual cycle appears to be impacting her iron levels, with noted decreases post-menstruation.  The patient has been on Ozempic for diabetes management and has noticed weight loss, although not reflected on the scale. She reports a decrease in clothing size from 2-3X to L and some M. She expresses interest in switching to [employer], as she has observed significant weight loss in others on this medication.  The patient also reports a desire to conceive another child, after which she plans to have a hysterectomy. She expresses concern about the potential impact of her current medications on pregnancy. She also mentions a delay in her menstrual cycle, raising the possibility of current pregnancy.  The patient has been managing her diabetes with NovoLog and Ozempic, and her iron deficiency with iron infusions. She also takes Mylelucast, Folic Acid, and Triamcinolone, all of which she requests refills for. She expresses interest in using a Dexcom device for glucose monitoring, citing the inconvenience of fingerstick checks.  The patient's insurance has recently changed, which may impact her medication supply. She reports a preference for a 90-day supply, which she had been receiving prior to the insurance change.      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   Trichomoniasis 01/15/2017   LGSIL of cervix of undetermined significance 01/15/2017   IDA (iron deficiency anemia) 07/19/2015   Menorrhagia with irregular cycle 07/19/2015   Panic 05/31/2015   Obesity (BMI 30-39.9) 05/17/2013   Eczema 05/17/2013   Discoloration of skin 01/17/2013   Past Medical History:  Diagnosis Date   Allergy    Anemia    Eczema    Gestational diabetes    Past Surgical History:  Procedure Laterality Date   CESAREAN SECTION N/A 06/07/2017   Procedure: CESAREAN SECTION;  Surgeon: Tereso Newcomer, MD;  Location: [facility];  Service: Obstetrics;  Laterality: N/A;   TONSILLECTOMY AND ADENOIDECTOMY Bilateral 01/02/2013   WISDOM TOOTH EXTRACTION     Social History   Tobacco Use   Smoking status: Never   Smokeless tobacco: Never  Vaping Use   Vaping status: Never Used  Substance Use Topics   Alcohol use: Yes    Alcohol/week: 0.0 standard drinks of alcohol   Drug use: No      Review of Systems  Constitutional:  Negative for chills, fever and malaise/fatigue.  HENT:  Negative for congestion and hearing loss.   Eyes:  Negative for discharge.  Respiratory:  Negative for cough, sputum production and shortness of breath.   Cardiovascular:  Negative for chest pain, palpitations  and leg swelling.  Gastrointestinal:  Negative for abdominal pain, blood in stool, constipation, diarrhea, heartburn, nausea and vomiting.  Genitourinary:  Negative for dysuria, frequency, hematuria and urgency.  Musculoskeletal:  Negative for back pain, falls and myalgias.  Skin:  Negative for rash.  Neurological:  Negative for dizziness, sensory change,  loss of consciousness, weakness and headaches.  Endo/Heme/Allergies:  Negative for environmental allergies. Does not bruise/bleed easily.  Psychiatric/Behavioral:  Negative for depression and suicidal ideas. The patient is not nervous/anxious and does not have insomnia.       Objective:     BP 114/80 (BP Location: Left Arm, Patient Position: Sitting, Cuff Size: Large)   Pulse 78   Temp 98.8 F (37.1 C) (Oral)   Resp 18   Ht 5' (1.524 m)   Wt 243 lb 9.6 oz (110.5 kg)   SpO2 98%   BMI 47.57 kg/m  BP Readings from Last 3 Encounters:  04/27/23 133/66  04/27/23 114/80  04/20/23 (!) 141/78   Wt Readings from Last 3 Encounters:  04/27/23 243 lb 9.6 oz (110.5 kg)  04/12/23 244 lb (110.7 kg)  01/11/23 237 lb (107.5 kg)   SpO2 Readings from Last 3 Encounters:  04/27/23 100%  04/27/23 98%  04/20/23 100%      Physical Exam Vitals and nursing note reviewed.  Constitutional:      General: She is not in acute distress.    Appearance: Normal appearance. She is well-developed.  HENT:     Head: Normocephalic and atraumatic.  Eyes:     General: No scleral icterus.       Right eye: No discharge.        Left eye: No discharge.  Cardiovascular:     Rate and Rhythm: Normal rate and regular rhythm.     Heart sounds: No murmur heard. Pulmonary:     Effort: Pulmonary effort is normal. No respiratory distress.     Breath sounds: Normal breath sounds.  Musculoskeletal:        General: Normal range of motion.     Cervical back: Normal range of motion and neck supple.     Right lower leg: No edema.     Left lower leg: No edema.  Skin:    General: Skin is warm and dry.  Neurological:     Mental Status: She is alert and oriented to person, place, and time.  Psychiatric:        Mood and Affect: Mood normal.        Behavior: Behavior normal.        Thought Content: Thought content normal.        Judgment: Judgment normal.      No results found for any visits on 04/27/23.  Last  CBC Lab Results  Component Value Date   WBC 7.0 04/12/2023   HGB 10.7 (L) 04/12/2023   HCT 34.3 (L) 04/12/2023   MCV 72.8 (L) 04/12/2023   MCH 22.7 (L) 04/12/2023   RDW 18.1 (H) 04/12/2023   PLT 329 04/12/2023   Last metabolic panel Lab Results  Component Value Date   GLUCOSE 122 (H) 09/04/2022   NA 139 09/04/2022   K 3.8 09/04/2022   CL 104 09/04/2022   CO2 25 09/04/2022   BUN 7 09/04/2022   CREATININE 0.91 09/04/2022   GFR 76.03 05/25/2022   CALCIUM 8.8 09/04/2022   PROT 7.3 09/04/2022   ALBUMIN 4.3 05/25/2022   BILITOT 0.3 09/04/2022   ALKPHOS 77 05/25/2022   AST 20  09/04/2022   ALT 19 09/04/2022   ANIONGAP 9 04/28/2022   Last lipids Lab Results  Component Value Date   CHOL 158 09/04/2022   HDL 34 (L) 09/04/2022   LDLCALC 102 (H) 09/04/2022   TRIG 123 09/04/2022   CHOLHDL 4.6 09/04/2022   Last hemoglobin A1c Lab Results  Component Value Date   HGBA1C 6.5 (H) 09/04/2022   Last thyroid functions Lab Results  Component Value Date   TSH 1.44 05/25/2022   Last vitamin D Lab Results  Component Value Date   VD25OH 20.31 (L) 09/14/2019   Last vitamin B12 and Folate Lab Results  Component Value Date   VITAMINB12 228 09/14/2019      The ASCVD Risk score (Arnett DK, et al., 2019) failed to calculate for the following reasons:   The 2019 ASCVD risk score is only valid for ages 93 to 61    Assessment & Plan:   Problem List Items Addressed This Visit       Unprioritized   Sickle cell trait (HCC)   Relevant Orders   Comprehensive metabolic panel   CBC with Differential/Platelet   Hemoglobin A1c   Lipid panel   Seasonal allergies   Relevant Medications   montelukast (SINGULAIR) 10 MG tablet   Amenorrhea   Relevant Orders   POCT urine pregnancy   Other Visit Diagnoses       Diabetes mellitus type II, non insulin dependent (HCC)    -  Primary   Relevant Medications   tirzepatide (MOUNJARO) 5 MG/0.5ML Pen   Continuous Glucose Sensor (DEXCOM G7  SENSOR) MISC   Other Relevant Orders   Comprehensive metabolic panel   CBC with Differential/Platelet   Hemoglobin A1c   Lipid panel   Microalbumin / creatinine urine ratio     Morbid obesity (HCC)       Relevant Medications   tirzepatide (MOUNJARO) 5 MG/0.5ML Pen   Other Relevant Orders   Comprehensive metabolic panel   CBC with Differential/Platelet   Hemoglobin A1c   Lipid panel     Assessment and Plan    Iron Deficiency Anemia Iron deficiency anemia is likely due to heavy menstrual cycles. Iron infusions are effective until menstruation depletes iron levels again. A plan for pregnancy followed by hysterectomy was discussed to address the underlying cause. Administer an iron infusion today and schedule a follow-up iron count next week.  Infertility Infertility is present with irregular menstrual cycles, and there is an active effort to conceive. A pregnancy test will be performed, and a potential referral to an infertility specialist will be discussed if conception does not occur.  Type 2 Diabetes Mellitus Type 2 diabetes mellitus is managed with Ozempic, resulting in significant weight loss and improved clothing fit, though the scale shows no change. There is interest in switching to [facility] based on peer experiences. Greggory Keen is newer and may offer better results but cannot be used during pregnancy. The initial dose will be next to lowest and adjusted based on response. Switch to [facility] at the next to lowest dose, prescribe Dexcom for continuous glucose monitoring, and instruct to download the Dexcom app and follow instructions. Offer pharmacist assistance for Dexcom setup if needed.  Obesity Obesity is noted with significant weight loss through clothing size reduction. There is a goal to lose an additional 20 pounds. The benefits of continued weight loss for overall health and diabetes management were discussed. Continue current weight loss efforts and monitor weight, adjusting  treatment as needed.  Asthma Asthma  is managed with Montelukast and triamcinolone, with no new symptoms reported. Refill Montelukast and triamcinolone.  General Health Maintenance Refill folic acid and discuss insurance changes and prescription costs.  Follow-up Schedule a follow-up for the iron count next week, monitor response to [facility] and adjust dosage as needed, and follow up on pregnancy test results.        Return in about 6 months (around 10/25/2023), or if symptoms worsen or fail to improve, for annual exam, fasting.    Donato Schultz, DO

## 2023-04-27 NOTE — Telephone Encounter (Signed)
Therapy changed to [facility]

## 2023-04-27 NOTE — Patient Instructions (Signed)

## 2023-04-27 NOTE — Telephone Encounter (Signed)
PA initiated via Covermymeds; KEY: [id number]. Awaiting determination.

## 2023-04-28 LAB — PREGNANCY, URINE: Preg Test, Ur: NEGATIVE

## 2023-04-29 ENCOUNTER — Telehealth: Payer: Self-pay

## 2023-04-29 NOTE — Telephone Encounter (Signed)
Copied from CRM [id number]. Topic: Clinical - Medication Question >> Apr 29, 2023  4:16 PM Almira Coaster wrote: Reason for CRM: Patient is calling because she spoke to her pharmacy and they are pending a PA for her [facility] 5mg  prescription. I advised that it was approved on 04/27/2023;however, she states she spoke to the pharmacy today and they have not received the approval.

## 2023-04-29 NOTE — Telephone Encounter (Signed)
Spoke w/ [facility]- informed that the PA was approved from 04/27/23 to 04/26/26, they tried re-running the PA but still getting denial code. Pharmacist states that PA is needed for medication AND qty, informed them I've never heard of needing 2 PA's for a medication, I asked that they call the plan directly.    I called and spoke w/ the Pt- informed her of above, informed her to let me know in the morning if she is still unable to get at [facility] we can try to send to a different pharmacy. Pt verbalized understanding.

## 2023-04-30 ENCOUNTER — Encounter: Payer: Self-pay | Admitting: Medical Oncology

## 2023-04-30 ENCOUNTER — Encounter: Payer: Self-pay | Admitting: Family Medicine

## 2023-04-30 ENCOUNTER — Ambulatory Visit: Payer: Self-pay | Admitting: Family Medicine

## 2023-04-30 NOTE — Telephone Encounter (Signed)
  Chief Complaint: Dexcom issues/info only Additional Notes: Patient calling re: newly placed Dexcom bleeding at insertion site. Patient at work and unable to reapply new one at different site. Patient also does not have glucometer as alternate source of glucose monitoring. Requesting call back from office to advise.    Copied from CRM [id number]. Topic: Clinical - Red Word Triage >> Apr 30, 2023  9:15 AM Pascal Lux wrote: Red Word that prompted transfer to Nurse Triage: Patient stated she just got the Continuous Glucose Sensor (DEXCOM G7 SENSOR) MISC [[id number]] and upon using it .Marland Kitchen It created a big spot of blood . The bleeding is on and off.    Reason for Disposition  General information question, no triage required and triager able to answer question  Answer Assessment - Initial Assessment Questions 1. REASON FOR CALL or QUESTION: "What is your reason for calling today?" or "How can I best help you?" or "What question do you have that I can help answer?"     Patient calling because she is having bleeding at [facility] insertion site (back of L arm). Patient reports she just applied Dexcom 30 minutes ago noticed slight bleeding. Now patient at work and noticed that blood has gone through her shirt, about the same size of dexcom. Reports that she was sent home with 3 applications, but is at home. RN advised that if there is significant bleeding, reapplication at a different site may be needed. Of note, patient is at work so unable to reapply. RN then advised to remove Dexcom, put pressure on site to stop bleeding, and monitor glucose with glucometer. Pt reports she does not have a glucometer and hasn't had one for 6 years. Pt reports she was on the "shot" prior to dexcom. Because of situation, RN then advised that she needed call pharmacy for additional guidance. Patient verbalized understanding.  Protocols used: Information Only Call - No Triage-A-AH

## 2023-05-01 ENCOUNTER — Encounter: Payer: Self-pay | Admitting: Family Medicine

## 2023-05-04 ENCOUNTER — Inpatient Hospital Stay: Payer: Managed Care, Other (non HMO)

## 2023-05-04 VITALS — BP 140/84 | HR 89 | Temp 98.6°F | Resp 18

## 2023-05-04 DIAGNOSIS — D500 Iron deficiency anemia secondary to blood loss (chronic): Secondary | ICD-10-CM | POA: Diagnosis not present

## 2023-05-04 MED ORDER — SODIUM CHLORIDE 0.9 % IV SOLN
300.0000 mg | Freq: Once | INTRAVENOUS | Status: AC
  Administered 2023-05-04: 300 mg via INTRAVENOUS
  Filled 2023-05-04: qty 300

## 2023-05-04 MED ORDER — SODIUM CHLORIDE 0.9 % IV SOLN: INTRAVENOUS | Status: DC

## 2023-05-04 NOTE — Patient Instructions (Signed)

## 2023-05-04 NOTE — Progress Notes (Signed)
Pt refused to stay for 30 minutes post iron infusion.  Pt without complaints at time of discharge. ?

## 2023-05-07 ENCOUNTER — Encounter: Payer: Self-pay | Admitting: Family Medicine

## 2023-05-07 ENCOUNTER — Other Ambulatory Visit: Payer: Self-pay | Admitting: Family Medicine

## 2023-05-07 DIAGNOSIS — J111 Influenza due to unidentified influenza virus with other respiratory manifestations: Secondary | ICD-10-CM

## 2023-05-07 MED ORDER — OSELTAMIVIR PHOSPHATE 75 MG PO CAPS: 75.0000 mg | ORAL_CAPSULE | Freq: Two times a day (BID) | ORAL | 0 refills | Status: DC

## 2023-05-11 ENCOUNTER — Inpatient Hospital Stay: Payer: Managed Care, Other (non HMO)

## 2023-05-11 ENCOUNTER — Inpatient Hospital Stay: Payer: Managed Care, Other (non HMO) | Attending: Family

## 2023-05-11 VITALS — BP 134/80 | HR 84 | Temp 98.9°F | Resp 18

## 2023-05-11 DIAGNOSIS — N920 Excessive and frequent menstruation with regular cycle: Secondary | ICD-10-CM | POA: Insufficient documentation

## 2023-05-11 DIAGNOSIS — N921 Excessive and frequent menstruation with irregular cycle: Secondary | ICD-10-CM

## 2023-05-11 DIAGNOSIS — D500 Iron deficiency anemia secondary to blood loss (chronic): Secondary | ICD-10-CM | POA: Insufficient documentation

## 2023-05-11 MED ORDER — IRON SUCROSE 20 MG/ML IV SOLN
300.0000 mg | Freq: Once | INTRAVENOUS | Status: DC
  Filled 2023-05-11: qty 15

## 2023-05-11 MED ORDER — SODIUM CHLORIDE 0.9 % IV SOLN: INTRAVENOUS | Status: DC

## 2023-05-11 NOTE — Progress Notes (Signed)
2 failed IV attempts. Patient wants to go home and rehydrate and reschedule until next week.

## 2023-05-18 ENCOUNTER — Inpatient Hospital Stay: Payer: Managed Care, Other (non HMO)

## 2023-05-18 VITALS — BP 139/84 | HR 88 | Temp 98.2°F | Resp 20

## 2023-05-18 DIAGNOSIS — N921 Excessive and frequent menstruation with irregular cycle: Secondary | ICD-10-CM

## 2023-05-18 DIAGNOSIS — N920 Excessive and frequent menstruation with regular cycle: Secondary | ICD-10-CM | POA: Diagnosis not present

## 2023-05-18 DIAGNOSIS — D500 Iron deficiency anemia secondary to blood loss (chronic): Secondary | ICD-10-CM | POA: Diagnosis present

## 2023-05-18 MED ORDER — SODIUM CHLORIDE 0.9 % IV SOLN: INTRAVENOUS | Status: DC

## 2023-05-18 MED ORDER — SODIUM CHLORIDE 0.9 % IV SOLN
300.0000 mg | Freq: Once | INTRAVENOUS | Status: AC
  Administered 2023-05-18: 300 mg via INTRAVENOUS
  Filled 2023-05-18: qty 300

## 2023-05-18 NOTE — Patient Instructions (Signed)

## 2023-06-01 ENCOUNTER — Encounter: Payer: Self-pay | Admitting: Family Medicine

## 2023-06-02 MED ORDER — TIRZEPATIDE 7.5 MG/0.5ML ~~LOC~~ SOAJ: 7.5000 mg | SUBCUTANEOUS | 0 refills | Status: DC

## 2023-07-12 ENCOUNTER — Ambulatory Visit: Payer: Managed Care, Other (non HMO) | Admitting: Medical Oncology

## 2023-07-12 ENCOUNTER — Inpatient Hospital Stay: Payer: Managed Care, Other (non HMO)

## 2023-07-12 ENCOUNTER — Inpatient Hospital Stay (HOSPITAL_BASED_OUTPATIENT_CLINIC_OR_DEPARTMENT_OTHER): Payer: Managed Care, Other (non HMO) | Admitting: Family

## 2023-07-12 ENCOUNTER — Inpatient Hospital Stay: Payer: Managed Care, Other (non HMO) | Attending: Family

## 2023-07-12 VITALS — BP 133/72 | HR 84 | Temp 98.6°F | Resp 18 | Ht 60.0 in | Wt 240.0 lb

## 2023-07-12 DIAGNOSIS — D500 Iron deficiency anemia secondary to blood loss (chronic): Secondary | ICD-10-CM

## 2023-07-12 DIAGNOSIS — N920 Excessive and frequent menstruation with regular cycle: Secondary | ICD-10-CM | POA: Insufficient documentation

## 2023-07-12 DIAGNOSIS — N921 Excessive and frequent menstruation with irregular cycle: Secondary | ICD-10-CM | POA: Diagnosis not present

## 2023-07-12 DIAGNOSIS — D573 Sickle-cell trait: Secondary | ICD-10-CM

## 2023-07-12 LAB — IRON AND IRON BINDING CAPACITY (CC-WL,HP ONLY)
Iron: 78 ug/dL (ref 28–170)
Saturation Ratios: 24 % (ref 10.4–31.8)
TIBC: 329 ug/dL (ref 250–450)
UIBC: 251 ug/dL (ref 148–442)

## 2023-07-12 LAB — CBC
HCT: 35.1 % — ABNORMAL LOW (ref 36.0–46.0)
Hemoglobin: 12.1 g/dL (ref 12.0–15.0)
MCH: 27.7 pg (ref 26.0–34.0)
MCHC: 34.5 g/dL (ref 30.0–36.0)
MCV: 80.3 fL (ref 80.0–100.0)
Platelets: 268 10*3/uL (ref 150–400)
RBC: 4.37 MIL/uL (ref 3.87–5.11)
RDW: 18.6 % — ABNORMAL HIGH (ref 11.5–15.5)
WBC: 5 10*3/uL (ref 4.0–10.5)
nRBC: 0 % (ref 0.0–0.2)

## 2023-07-12 LAB — RETIC PANEL
Immature Retic Fract: 11.4 % (ref 2.3–15.9)
RBC.: 4.44 MIL/uL (ref 3.87–5.11)
Retic Count, Absolute: 90.1 10*3/uL (ref 19.0–186.0)
Retic Ct Pct: 2 % (ref 0.4–3.1)
Reticulocyte Hemoglobin: 32 pg (ref >27.9)

## 2023-07-12 LAB — FERRITIN: Ferritin: 114 ng/mL (ref 11–307)

## 2023-07-12 NOTE — Progress Notes (Signed)
 Hematology and Oncology Follow Up Visit  Cheryl Mullen [id number] 1990/10/02 32 y.o. 07/12/2023   Principle Diagnosis:  Iron deficiency anemia secondary to menorrhagia    Current Therapy:        IV iron as indicated   Interim History:  Cheryl Mullen is here with her mother for follow-up. She is feeling fatigued at times.  Cycle is regular with heavy flow. No other blood loss noted.  No fever, chills, n/v, cough, rash, dizziness, SOB, chest pain, palpitations, abdominal pain or changes in bowel or bladder habits.  No swelling, tenderness, numbness or tingling in her extremities.  No falls or syncope.  Appetite and hydration are good. Weight is stable at 240 lbs.   ECOG Performance Status: 1 - Symptomatic but completely ambulatory  Medications:  Allergies as of 07/12/2023       Reactions   Prednisone Other (See Comments)   Hear rate speeds up   Nickel Hives        Medication List        Accurate as of July 12, 2023 11:15 AM. If you have any questions, ask your nurse or doctor.          STOP taking these medications    oseltamivir 75 MG capsule Commonly known as: Tamiflu Stopped by: Eileen Stanford       TAKE these medications    Dexcom G7 Sensor Misc As directed   folic acid 1 MG tablet Commonly known as: FOLVITE Take 1 tablet by mouth once daily   levocetirizine 5 MG tablet Commonly known as: XYZAL TAKE 1 TABLET BY MOUTH ONCE DAILY IN THE EVENING   montelukast 10 MG tablet Commonly known as: SINGULAIR Take 1 tablet (10 mg total) by mouth at bedtime.   tirzepatide 7.5 MG/0.5ML Pen Commonly known as: MOUNJARO Inject 7.5 mg into the skin once a week.   triamcinolone ointment 0.1 % Commonly known as: KENALOG APPLY  OINTMENT TOPICALLY TWICE DAILY        Allergies:  Allergies  Allergen Reactions   Prednisone Other (See Comments)    Hear rate speeds up   Nickel Hives    Past Medical History, Surgical history, Social history, and Family History  were reviewed and updated.  Review of Systems: All other 10 point review of systems is negative.   Physical Exam:  height is 5' (1.524 m) and weight is 240 lb (108.9 kg). Her oral temperature is 98.6 F (37 C). Her blood pressure is 133/72 and her pulse is 84. Her respiration is 18 and oxygen saturation is 98%.   Wt Readings from Last 3 Encounters:  07/12/23 240 lb (108.9 kg)  04/27/23 243 lb 9.6 oz (110.5 kg)  04/12/23 244 lb (110.7 kg)    Ocular: Sclerae unicteric, pupils equal, round and reactive to light Ear-nose-throat: Oropharynx clear, dentition fair Lymphatic: No cervical or supraclavicular adenopathy Lungs no rales or rhonchi, good excursion bilaterally Heart regular rate and rhythm, no murmur appreciated Abd soft, nontender, positive bowel sounds MSK no focal spinal tenderness, no joint edema Neuro: non-focal, well-oriented, appropriate affect Breasts: Deferred   Lab Results  Component Value Date   WBC 5.0 07/12/2023   HGB 12.1 07/12/2023   HCT 35.1 (L) 07/12/2023   MCV 80.3 07/12/2023   PLT 268 07/12/2023   Lab Results  Component Value Date   FERRITIN 16 04/12/2023   IRON 26 (L) 04/12/2023   TIBC 413 04/12/2023   UIBC 387 04/12/2023   IRONPCTSAT 6 (L) 04/12/2023  Lab Results  Component Value Date   RETICCTPCT 2.0 07/12/2023   RBC 4.37 07/12/2023   RBC 4.44 07/12/2023   No results found for: "KPAFRELGTCHN", "LAMBDASER", "KAPLAMBRATIO" No results found for: "IGGSERUM", "IGA", "IGMSERUM" No results found for: "TOTALPROTELP", "ALBUMINELP", "A1GS", "A2GS", "BETS", "BETA2SER", "GAMS", "MSPIKE", "SPEI"   Chemistry      Component Value Date/Time   NA 137 04/27/2023 0944   NA 142 06/18/2017 1140   K 4.0 04/27/2023 0944   CL 103 04/27/2023 0944   CO2 26 04/27/2023 0944   BUN 8 04/27/2023 0944   BUN 22 (H) 06/18/2017 1140   CREATININE 0.81 04/27/2023 0944   CREATININE 0.91 09/04/2022 1448      Component Value Date/Time   CALCIUM 8.7 04/27/2023 0944    ALKPHOS 70 04/27/2023 0944   AST 22 04/27/2023 0944   ALT 28 04/27/2023 0944   BILITOT 0.4 04/27/2023 0944       Impression and Plan: Cheryl Mullen is a very pleasant 33 yo African American female with iron deficiency anemia secondary to heavy cycles as well as the sickle cell trait.  She will continue her folic acid daily.  Iron studies pending. We will replace if needed.  Follow-up in 4 months.   Eileen Stanford, NP 4/7/202511:15 AM

## 2023-10-18 ENCOUNTER — Encounter: Payer: Self-pay | Admitting: Family Medicine

## 2023-11-12 ENCOUNTER — Ambulatory Visit: Admitting: Family

## 2023-11-12 ENCOUNTER — Inpatient Hospital Stay

## 2023-11-19 ENCOUNTER — Inpatient Hospital Stay: Attending: Family

## 2023-11-19 ENCOUNTER — Inpatient Hospital Stay: Admitting: Family

## 2023-11-22 ENCOUNTER — Telehealth: Payer: Self-pay | Admitting: Family

## 2023-11-22 NOTE — Telephone Encounter (Signed)
 Lvm for patient to return call for scheduling missed office visit from 11/19/23.

## 2023-12-17 ENCOUNTER — Inpatient Hospital Stay (HOSPITAL_BASED_OUTPATIENT_CLINIC_OR_DEPARTMENT_OTHER): Admitting: Family

## 2023-12-17 ENCOUNTER — Inpatient Hospital Stay: Attending: Family

## 2023-12-17 VITALS — BP 118/84 | HR 90 | Temp 98.6°F | Resp 18 | Ht 60.0 in | Wt 245.8 lb

## 2023-12-17 DIAGNOSIS — D500 Iron deficiency anemia secondary to blood loss (chronic): Secondary | ICD-10-CM

## 2023-12-17 DIAGNOSIS — D573 Sickle-cell trait: Secondary | ICD-10-CM

## 2023-12-17 DIAGNOSIS — N921 Excessive and frequent menstruation with irregular cycle: Secondary | ICD-10-CM

## 2023-12-17 DIAGNOSIS — N920 Excessive and frequent menstruation with regular cycle: Secondary | ICD-10-CM | POA: Diagnosis present

## 2023-12-17 LAB — CBC WITH DIFFERENTIAL (CANCER CENTER ONLY)
Abs Immature Granulocytes: 0.02 K/uL (ref 0.00–0.07)
Basophils Absolute: 0 K/uL (ref 0.0–0.1)
Basophils Relative: 0 %
Eosinophils Absolute: 0.1 K/uL (ref 0.0–0.5)
Eosinophils Relative: 2 %
HCT: 36.4 % (ref 36.0–46.0)
Hemoglobin: 12.5 g/dL (ref 12.0–15.0)
Immature Granulocytes: 0 %
Lymphocytes Relative: 38 %
Lymphs Abs: 2.7 K/uL (ref 0.7–4.0)
MCH: 28.7 pg (ref 26.0–34.0)
MCHC: 34.3 g/dL (ref 30.0–36.0)
MCV: 83.5 fL (ref 80.0–100.0)
Monocytes Absolute: 0.4 K/uL (ref 0.1–1.0)
Monocytes Relative: 6 %
Neutro Abs: 3.9 K/uL (ref 1.7–7.7)
Neutrophils Relative %: 54 %
Platelet Count: 264 K/uL (ref 150–400)
RBC: 4.36 MIL/uL (ref 3.87–5.11)
RDW: 13.4 % (ref 11.5–15.5)
WBC Count: 7.2 K/uL (ref 4.0–10.5)
nRBC: 0 % (ref 0.0–0.2)

## 2023-12-17 LAB — RETICULOCYTES
Immature Retic Fract: 14.7 % (ref 2.3–15.9)
RBC.: 4.32 MIL/uL (ref 3.87–5.11)
Retic Count, Absolute: 95.9 K/uL (ref 19.0–186.0)
Retic Ct Pct: 2.2 % (ref 0.4–3.1)

## 2023-12-17 LAB — IRON AND IRON BINDING CAPACITY (CC-WL,HP ONLY)
Iron: 48 ug/dL (ref 28–170)
Saturation Ratios: 15 % (ref 10.4–31.8)
TIBC: 332 ug/dL (ref 250–450)
UIBC: 284 ug/dL

## 2023-12-17 LAB — FERRITIN: Ferritin: 128 ng/mL (ref 11–307)

## 2023-12-17 NOTE — Progress Notes (Signed)
 Hematology and Oncology Follow Up Visit  Cheryl Mullen [id number] 10-27-1990 33 y.o. 12/17/2023   Principle Diagnosis:  Iron  deficiency anemia secondary to menorrhagia    Current Therapy:        IV iron  as indicated   Interim History:  Cheryl Mullen is here today with her sweet daughter for follow-up. She is feeling very fatigued needing a nap mid day to get through to bedtime.  Cycle is regular with flow heavy at times. No other blood loss noted. No bruising or petechiae.  No fever, chills, n/v, cough, rash, dizziness, SOB, chest pain, palpitations, abdominal pain or changes in bowel or bladder habits.  No swelling, tenderness, numbness or tingling in her extremities.  No falls or syncope.  Appetite and hydration are good. Weight is stable at 245 lbs.   ECOG Performance Status: 1 - Symptomatic but completely ambulatory  Medications:  Allergies as of 12/17/2023       Reactions   Prednisone  Other (See Comments)   Hear rate speeds up   Nickel Hives        Medication List        Accurate as of December 17, 2023  2:32 PM. If you have any questions, ask your nurse or doctor.          Dexcom G7 Sensor Misc As directed   folic acid  1 MG tablet Commonly known as: FOLVITE  Take 1 tablet by mouth once daily   levocetirizine 5 MG tablet Commonly known as: XYZAL  TAKE 1 TABLET BY MOUTH ONCE DAILY IN THE EVENING   montelukast  10 MG tablet Commonly known as: SINGULAIR  Take 1 tablet (10 mg total) by mouth at bedtime.   tirzepatide  7.5 MG/0.5ML Pen Commonly known as: MOUNJARO  Inject 7.5 mg into the skin once a week.   triamcinolone  ointment 0.1 % Commonly known as: KENALOG  APPLY  OINTMENT TOPICALLY TWICE DAILY        Allergies:  Allergies  Allergen Reactions   Prednisone  Other (See Comments)    Hear rate speeds up   Nickel Hives    Past Medical History, Surgical history, Social history, and Family History were reviewed and updated.  Review of Systems: All  other 10 point review of systems is negative.   Physical Exam:  height is 5' (1.524 m) and weight is 245 lb 12.8 oz (111.5 kg). Her oral temperature is 98.6 F (37 C). Her blood pressure is 118/84 and her pulse is 90. Her respiration is 18 and oxygen saturation is 96%.   Wt Readings from Last 3 Encounters:  12/17/23 245 lb 12.8 oz (111.5 kg)  07/12/23 240 lb (108.9 kg)  04/27/23 243 lb 9.6 oz (110.5 kg)    Ocular: Sclerae unicteric, pupils equal, round and reactive to light Ear-nose-throat: Oropharynx clear, dentition fair Lymphatic: No cervical or supraclavicular adenopathy Lungs no rales or rhonchi, good excursion bilaterally Heart regular rate and rhythm, no murmur appreciated Abd soft, nontender, positive bowel sounds MSK no focal spinal tenderness, no joint edema Neuro: non-focal, well-oriented, appropriate affect Breasts: Deferred   Lab Results  Component Value Date   WBC 7.2 12/17/2023   HGB 12.5 12/17/2023   HCT 36.4 12/17/2023   MCV 83.5 12/17/2023   PLT 264 12/17/2023   Lab Results  Component Value Date   FERRITIN 114 07/12/2023   IRON  78 07/12/2023   TIBC 329 07/12/2023   UIBC 251 07/12/2023   IRONPCTSAT 24 07/12/2023   Lab Results  Component Value Date   RETICCTPCT 2.2 12/17/2023  RBC 4.36 12/17/2023   RBC 4.32 12/17/2023   No results found for: KPAFRELGTCHN, LAMBDASER, KAPLAMBRATIO No results found for: IGGSERUM, IGA, IGMSERUM No results found for: STEPHANY CARLOTA BENSON MARKEL EARLA JOANNIE DOC VICK, SPEI   Chemistry      Component Value Date/Time   NA 137 04/27/2023 0944   NA 142 06/18/2017 1140   K 4.0 04/27/2023 0944   CL 103 04/27/2023 0944   CO2 26 04/27/2023 0944   BUN 8 04/27/2023 0944   BUN 22 (H) 06/18/2017 1140   CREATININE 0.81 04/27/2023 0944   CREATININE 0.91 09/04/2022 1448      Component Value Date/Time   CALCIUM  8.7 04/27/2023 0944   ALKPHOS 70 04/27/2023 0944   AST 22 04/27/2023  0944   ALT 28 04/27/2023 0944   BILITOT 0.4 04/27/2023 0944       Impression and Plan: Cheryl Mullen is a very pleasant 33 yo African American female with iron  deficiency anemia secondary to heavy cycles as well as the sickle cell trait.  She will continue her folic acid  daily.  Iron  studies pending. We will replace if needed.  Follow-up in 4 months.   Lauraine Pepper, NP 9/12/20252:32 PM

## 2023-12-24 ENCOUNTER — Inpatient Hospital Stay

## 2023-12-24 VITALS — BP 151/89 | HR 80 | Temp 98.5°F | Resp 18

## 2023-12-24 DIAGNOSIS — N921 Excessive and frequent menstruation with irregular cycle: Secondary | ICD-10-CM

## 2023-12-24 DIAGNOSIS — D500 Iron deficiency anemia secondary to blood loss (chronic): Secondary | ICD-10-CM

## 2023-12-24 MED ORDER — SODIUM CHLORIDE 0.9 % IV SOLN: INTRAVENOUS | Status: DC

## 2023-12-24 MED ORDER — IRON SUCROSE 300 MG IVPB - SIMPLE MED
300.0000 mg | Freq: Once | Status: AC
  Administered 2023-12-24: 300 mg via INTRAVENOUS
  Filled 2023-12-24: qty 300

## 2023-12-24 NOTE — Patient Instructions (Signed)

## 2023-12-31 ENCOUNTER — Inpatient Hospital Stay

## 2023-12-31 VITALS — BP 135/81 | HR 79 | Temp 98.3°F | Resp 18 | Wt 245.0 lb

## 2023-12-31 DIAGNOSIS — D500 Iron deficiency anemia secondary to blood loss (chronic): Secondary | ICD-10-CM

## 2023-12-31 DIAGNOSIS — N921 Excessive and frequent menstruation with irregular cycle: Secondary | ICD-10-CM

## 2023-12-31 MED ORDER — SODIUM CHLORIDE 0.9 % IV SOLN: INTRAVENOUS | Status: DC

## 2023-12-31 MED ORDER — IRON SUCROSE 300 MG IVPB - SIMPLE MED
300.0000 mg | Freq: Once | Status: AC
  Administered 2023-12-31: 300 mg via INTRAVENOUS
  Filled 2023-12-31: qty 300

## 2023-12-31 NOTE — Patient Instructions (Signed)

## 2024-02-29 ENCOUNTER — Encounter: Payer: Self-pay | Admitting: Family Medicine

## 2024-02-29 DIAGNOSIS — N926 Irregular menstruation, unspecified: Secondary | ICD-10-CM

## 2024-03-01 ENCOUNTER — Other Ambulatory Visit

## 2024-03-01 DIAGNOSIS — N926 Irregular menstruation, unspecified: Secondary | ICD-10-CM | POA: Diagnosis not present

## 2024-03-02 LAB — BETA HCG QUANT (REF LAB): hCG Quant: <1 m[IU]/mL

## 2024-04-07 ENCOUNTER — Encounter: Payer: Self-pay | Admitting: Family

## 2024-04-10 ENCOUNTER — Encounter: Payer: Self-pay | Admitting: Family

## 2024-04-17 ENCOUNTER — Ambulatory Visit: Admitting: Family

## 2024-04-17 ENCOUNTER — Other Ambulatory Visit

## 2024-04-21 ENCOUNTER — Inpatient Hospital Stay: Payer: Self-pay

## 2024-04-21 ENCOUNTER — Inpatient Hospital Stay: Payer: Self-pay | Admitting: Family

## 2024-04-28 ENCOUNTER — Inpatient Hospital Stay: Payer: Self-pay | Attending: Hematology & Oncology

## 2024-04-28 ENCOUNTER — Inpatient Hospital Stay: Payer: Self-pay | Admitting: Family

## 2024-04-28 VITALS — BP 144/77 | HR 79 | Temp 97.8°F | Resp 18 | Wt 247.0 lb

## 2024-04-28 DIAGNOSIS — D573 Sickle-cell trait: Secondary | ICD-10-CM

## 2024-04-28 DIAGNOSIS — D500 Iron deficiency anemia secondary to blood loss (chronic): Secondary | ICD-10-CM

## 2024-04-28 DIAGNOSIS — N921 Excessive and frequent menstruation with irregular cycle: Secondary | ICD-10-CM

## 2024-04-28 LAB — CBC WITH DIFFERENTIAL (CANCER CENTER ONLY)
Abs Immature Granulocytes: 0.02 K/uL (ref 0.00–0.07)
Basophils Absolute: 0 K/uL (ref 0.0–0.1)
Basophils Relative: 0 %
Eosinophils Absolute: 0.2 K/uL (ref 0.0–0.5)
Eosinophils Relative: 3 %
HCT: 35.3 % — ABNORMAL LOW (ref 36.0–46.0)
Hemoglobin: 12.3 g/dL (ref 12.0–15.0)
Immature Granulocytes: 0 %
Lymphocytes Relative: 39 %
Lymphs Abs: 2 K/uL (ref 0.7–4.0)
MCH: 29.1 pg (ref 26.0–34.0)
MCHC: 34.8 g/dL (ref 30.0–36.0)
MCV: 83.5 fL (ref 80.0–100.0)
Monocytes Absolute: 0.3 K/uL (ref 0.1–1.0)
Monocytes Relative: 5 %
Neutro Abs: 2.6 K/uL (ref 1.7–7.7)
Neutrophils Relative %: 53 %
Platelet Count: 223 K/uL (ref 150–400)
RBC: 4.23 MIL/uL (ref 3.87–5.11)
RDW: 12.9 % (ref 11.5–15.5)
WBC Count: 5 K/uL (ref 4.0–10.5)
nRBC: 0 % (ref 0.0–0.2)

## 2024-04-28 LAB — IRON AND IRON BINDING CAPACITY (CC-WL,HP ONLY)
Iron: 85 ug/dL (ref 28–170)
Saturation Ratios: 28 % (ref 10.4–31.8)
TIBC: 304 ug/dL (ref 250–450)
UIBC: 219 ug/dL

## 2024-04-28 LAB — RETICULOCYTES
Immature Retic Fract: 13.5 % (ref 2.3–15.9)
RBC.: 4.21 MIL/uL (ref 3.87–5.11)
Retic Count, Absolute: 107.4 K/uL (ref 19.0–186.0)
Retic Ct Pct: 2.6 % (ref 0.4–3.1)

## 2024-04-28 LAB — FERRITIN: Ferritin: 642 ng/mL — ABNORMAL HIGH (ref 11–307)

## 2024-04-28 NOTE — Progress Notes (Signed)
 " Hematology and Oncology Follow Up Visit  Cheryl Mullen [id number] [birth year] 34 y.o. 04/28/2024   Principle Diagnosis:  Iron  deficiency anemia secondary to menorrhagia    Current Therapy:        IV iron  as indicated   Interim History:  Cheryl Mullen is here today for follow-up. She is doing well but has had an irregular cycle. She skipped for 3 months and then recently had a heavy cycle. Not many clots were noted.  No other blood loss.  No c/o fever, chills, n/v, cough, rash, dizziness, SOB, chest pain, palpitations, abdominal pain or changes in bowel or bladder habits.  No swelling in her extremities.  1 random episode of tingling in her feet when she took off her shoes recently.  No other issues.  No falls or syncope.  Appetite and hydration are good. Weight is stable at 247 lbs.   ECOG Performance Status: 1 - Symptomatic but completely ambulatory  Medications:  Allergies as of 04/28/2024       Reactions   Prednisone  Other (See Comments)   Hear rate speeds up   Nickel Hives        Medication List        Accurate as of April 28, 2024 10:11 AM. If you have any questions, ask your nurse or doctor.          Dexcom G7 Sensor Misc As directed   folic acid  1 MG tablet Commonly known as: FOLVITE  Take 1 tablet by mouth once daily   levocetirizine 5 MG tablet Commonly known as: XYZAL  TAKE 1 TABLET BY MOUTH ONCE DAILY IN THE EVENING   montelukast  10 MG tablet Commonly known as: SINGULAIR  Take 1 tablet (10 mg total) by mouth at bedtime.   tirzepatide  7.5 MG/0.5ML Pen Commonly known as: MOUNJARO  Inject 7.5 mg into the skin once a week.   triamcinolone  ointment 0.1 % Commonly known as: KENALOG  APPLY  OINTMENT TOPICALLY TWICE DAILY        Allergies: Allergies[1]  Past Medical History, Surgical history, Social history, and Family History were reviewed and updated.  Review of Systems: All other 10 point review of systems is negative.   Physical Exam:   weight is 247 lb (112 kg). Her oral temperature is 97.8 F (36.6 C). Her blood pressure is 144/77 (abnormal) and her pulse is 79. Her respiration is 18 and oxygen saturation is 100%.   Wt Readings from Last 3 Encounters:  04/28/24 247 lb (112 kg)  12/31/23 245 lb (111.1 kg)  12/17/23 245 lb 12.8 oz (111.5 kg)    Ocular: Sclerae unicteric, pupils equal, round and reactive to light Ear-nose-throat: Oropharynx clear, dentition fair Lymphatic: No cervical or supraclavicular adenopathy Lungs no rales or rhonchi, good excursion bilaterally Heart regular rate and rhythm, no murmur appreciated Abd soft, nontender, positive bowel sounds MSK no focal spinal tenderness, no joint edema Neuro: non-focal, well-oriented, appropriate affect Breasts: Deferred   Lab Results  Component Value Date   WBC 5.0 04/28/2024   HGB 12.3 04/28/2024   HCT 35.3 (L) 04/28/2024   MCV 83.5 04/28/2024   PLT 223 04/28/2024   Lab Results  Component Value Date   FERRITIN 128 12/17/2023   IRON  48 12/17/2023   TIBC 332 12/17/2023   UIBC 284 12/17/2023   IRONPCTSAT 15 12/17/2023   Lab Results  Component Value Date   RETICCTPCT 2.6 04/28/2024   RBC 4.21 04/28/2024   RBC 4.23 04/28/2024   No results found for: KPAFRELGTCHN, LAMBDASER, KAPLAMBRATIO No  results found for: IGGSERUM, IGA, IGMSERUM No results found for: STEPHANY CARLOTA BENSON MARKEL EARLA JOANNIE DOC VICK, SPEI   Chemistry      Component Value Date/Time   NA 137 04/27/2023 0944   NA 142 06/18/2017 1140   K 4.0 04/27/2023 0944   CL 103 04/27/2023 0944   CO2 26 04/27/2023 0944   BUN 8 04/27/2023 0944   BUN 22 (H) 06/18/2017 1140   CREATININE 0.81 04/27/2023 0944   CREATININE 0.91 09/04/2022 1448      Component Value Date/Time   CALCIUM  8.7 04/27/2023 0944   ALKPHOS 70 04/27/2023 0944   AST 22 04/27/2023 0944   ALT 28 04/27/2023 0944   BILITOT 0.4 04/27/2023 0944       Impression and Plan:  Cheryl Mullen is a very pleasant 34 yo African American female with iron  deficiency anemia secondary to heavy cycles as well as the sickle cell trait.  She will continue her folic acid  daily.  Iron  studies pending. We will replace if needed.  Follow-up in 4 months.   Lauraine Pepper, NP 1/23/202610:11 AM     [1]  Allergies Allergen Reactions   Prednisone  Other (See Comments)    Hear rate speeds up   Nickel Hives   "

## 2024-05-11 ENCOUNTER — Ambulatory Visit: Payer: Self-pay | Admitting: Family Medicine

## 2024-05-11 VITALS — BP 136/82 | HR 82 | Wt 249.0 lb

## 2024-05-11 DIAGNOSIS — N914 Secondary oligomenorrhea: Secondary | ICD-10-CM

## 2024-05-11 NOTE — Progress Notes (Unsigned)
 Patient states she hasn't had a period for eighty days. Cheryl Mullen l Meryl Ponder, CMA

## 2024-08-25 ENCOUNTER — Inpatient Hospital Stay: Payer: Self-pay | Admitting: Family

## 2024-08-25 ENCOUNTER — Inpatient Hospital Stay: Payer: Self-pay
